# Patient Record
Sex: Female | Born: 1935
Health system: Southern US, Community
[De-identification: ages and names within clinical notes are randomized; demographics above are authoritative.]

## PROBLEM LIST (undated history)

## (undated) DIAGNOSIS — E876 Hypokalemia: Secondary | ICD-10-CM

## (undated) DIAGNOSIS — M810 Age-related osteoporosis without current pathological fracture: Secondary | ICD-10-CM

## (undated) DIAGNOSIS — J45909 Unspecified asthma, uncomplicated: Secondary | ICD-10-CM

## (undated) DIAGNOSIS — M159 Polyosteoarthritis, unspecified: Secondary | ICD-10-CM

## (undated) DIAGNOSIS — I10 Essential (primary) hypertension: Secondary | ICD-10-CM

## (undated) DIAGNOSIS — S82892A Other fracture of left lower leg, initial encounter for closed fracture: Secondary | ICD-10-CM

## (undated) DIAGNOSIS — U071 COVID-19: Secondary | ICD-10-CM

## (undated) DIAGNOSIS — K759 Inflammatory liver disease, unspecified: Secondary | ICD-10-CM

## (undated) DIAGNOSIS — H409 Unspecified glaucoma: Secondary | ICD-10-CM

## (undated) HISTORY — DX: Other fracture of left lower leg, initial encounter for closed fracture: S82.892A

## (undated) HISTORY — DX: Age-related osteoporosis without current pathological fracture: M81.0

## (undated) HISTORY — DX: COVID-19: U07.1

## (undated) HISTORY — PX: FRACTURE SURGERY: SHX138

## (undated) HISTORY — DX: Unspecified asthma, uncomplicated: J45.909

## (undated) HISTORY — DX: Polyosteoarthritis, unspecified: M15.9

## (undated) HISTORY — DX: Unspecified glaucoma: H40.9

## (undated) HISTORY — DX: Hypokalemia: E87.6

## (undated) HISTORY — DX: Inflammatory liver disease, unspecified: K75.9

## (undated) HISTORY — DX: Essential (primary) hypertension: I10

## (undated) HISTORY — PX: REFRACTIVE SURGERY: SHX103

---

## 1998-03-05 ENCOUNTER — Other Ambulatory Visit: Admission: RE | Admit: 1998-03-05 | Discharge: 1998-03-05 | Payer: Self-pay | Admitting: Family Medicine

## 1999-04-04 ENCOUNTER — Other Ambulatory Visit: Admission: RE | Admit: 1999-04-04 | Discharge: 1999-04-04 | Payer: Self-pay | Admitting: Family Medicine

## 2001-09-25 ENCOUNTER — Other Ambulatory Visit: Admission: RE | Admit: 2001-09-25 | Discharge: 2001-09-25 | Payer: Self-pay | Admitting: Family Medicine

## 2003-10-20 ENCOUNTER — Ambulatory Visit (HOSPITAL_COMMUNITY): Admission: RE | Admit: 2003-10-20 | Discharge: 2003-10-20 | Payer: Self-pay | Admitting: Ophthalmology

## 2006-08-20 ENCOUNTER — Other Ambulatory Visit: Admission: RE | Admit: 2006-08-20 | Discharge: 2006-08-20 | Payer: Self-pay | Admitting: Family Medicine

## 2008-10-26 ENCOUNTER — Ambulatory Visit (HOSPITAL_COMMUNITY): Admission: RE | Admit: 2008-10-26 | Discharge: 2008-10-26 | Payer: Self-pay | Admitting: Ophthalmology

## 2009-08-30 ENCOUNTER — Ambulatory Visit (HOSPITAL_COMMUNITY): Admission: RE | Admit: 2009-08-30 | Discharge: 2009-08-30 | Payer: Self-pay | Admitting: Ophthalmology

## 2009-10-24 LAB — HM MAMMOGRAPHY: HM Mammogram: NORMAL

## 2010-08-06 ENCOUNTER — Emergency Department (HOSPITAL_BASED_OUTPATIENT_CLINIC_OR_DEPARTMENT_OTHER)
Admission: EM | Admit: 2010-08-06 | Discharge: 2010-08-06 | Disposition: A | Discharge status: Home / Self Care | Payer: Medicare Other | Attending: Emergency Medicine | Admitting: Emergency Medicine

## 2010-08-06 DIAGNOSIS — Z79899 Other long term (current) drug therapy: Secondary | ICD-10-CM | POA: Insufficient documentation

## 2010-08-06 DIAGNOSIS — M109 Gout, unspecified: Secondary | ICD-10-CM | POA: Insufficient documentation

## 2010-08-06 DIAGNOSIS — I10 Essential (primary) hypertension: Secondary | ICD-10-CM | POA: Insufficient documentation

## 2010-08-06 DIAGNOSIS — Z8739 Personal history of other diseases of the musculoskeletal system and connective tissue: Secondary | ICD-10-CM | POA: Insufficient documentation

## 2010-08-06 DIAGNOSIS — M810 Age-related osteoporosis without current pathological fracture: Secondary | ICD-10-CM | POA: Insufficient documentation

## 2010-08-06 DIAGNOSIS — M79609 Pain in unspecified limb: Secondary | ICD-10-CM | POA: Insufficient documentation

## 2010-08-06 LAB — URIC ACID: Uric Acid, Serum: 5.7 mg/dL (ref 2.4–7.0)

## 2010-08-06 LAB — BASIC METABOLIC PANEL
Calcium: 8.9 mg/dL (ref 8.4–10.5)
Chloride: 109 mEq/L (ref 96–112)
Creatinine, Ser: 0.6 mg/dL (ref 0.4–1.2)
GFR calc Af Amer: >60 mL/min (ref >60)
GFR calc non Af Amer: >60 mL/min (ref >60)

## 2010-10-12 ENCOUNTER — Encounter: Payer: Self-pay | Admitting: Nurse Practitioner

## 2010-10-12 DIAGNOSIS — K759 Inflammatory liver disease, unspecified: Secondary | ICD-10-CM

## 2010-10-12 DIAGNOSIS — H409 Unspecified glaucoma: Secondary | ICD-10-CM | POA: Insufficient documentation

## 2010-10-12 DIAGNOSIS — M47816 Spondylosis without myelopathy or radiculopathy, lumbar region: Secondary | ICD-10-CM | POA: Insufficient documentation

## 2010-10-12 DIAGNOSIS — E119 Type 2 diabetes mellitus without complications: Secondary | ICD-10-CM | POA: Insufficient documentation

## 2010-10-12 DIAGNOSIS — I10 Essential (primary) hypertension: Secondary | ICD-10-CM | POA: Insufficient documentation

## 2012-01-04 ENCOUNTER — Encounter (HOSPITAL_COMMUNITY): Payer: Self-pay | Admitting: Pharmacy Technician

## 2012-01-08 ENCOUNTER — Encounter (HOSPITAL_COMMUNITY)
Admission: RE | Disposition: A | Discharge status: Home / Self Care | Payer: Self-pay | Source: Ambulatory Visit | Attending: Ophthalmology

## 2012-01-08 ENCOUNTER — Ambulatory Visit (HOSPITAL_COMMUNITY)
Admission: RE | Admit: 2012-01-08 | Discharge: 2012-01-08 | Disposition: A | Discharge status: Home / Self Care | Payer: Medicare Other | Source: Ambulatory Visit | Attending: Ophthalmology | Admitting: Ophthalmology

## 2012-01-08 ENCOUNTER — Encounter (HOSPITAL_COMMUNITY): Payer: Self-pay | Admitting: *Deleted

## 2012-01-08 DIAGNOSIS — H4089 Other specified glaucoma: Secondary | ICD-10-CM | POA: Insufficient documentation

## 2012-01-08 SURGERY — SLT LASER APPLICATION
Anesthesia: LOCAL | Laterality: Right

## 2012-01-08 MED ORDER — TETRACAINE HCL 0.5 % OP SOLN
OPHTHALMIC | Status: AC
  Administered 2012-01-08: 1 [drp] via OPHTHALMIC
  Filled 2012-01-08: qty 2

## 2012-01-08 MED ORDER — TETRACAINE HCL 0.5 % OP SOLN
1.0000 [drp] | Freq: Once | OPHTHALMIC | Status: AC
  Administered 2012-01-08: 1 [drp] via OPHTHALMIC

## 2012-01-08 MED ORDER — PILOCARPINE HCL 1 % OP SOLN
OPHTHALMIC | Status: AC
  Administered 2012-01-08: 1 [drp] via OPHTHALMIC
  Filled 2012-01-08: qty 15

## 2012-01-08 MED ORDER — APRACLONIDINE HCL 1 % OP SOLN
OPHTHALMIC | Status: AC
  Administered 2012-01-08: 1 [drp] via OPHTHALMIC
  Filled 2012-01-08: qty 0.1

## 2012-01-08 MED ORDER — APRACLONIDINE HCL 1 % OP SOLN
1.0000 [drp] | OPHTHALMIC | Status: AC
  Administered 2012-01-08 (×3): 1 [drp] via OPHTHALMIC

## 2012-01-08 MED ORDER — PILOCARPINE HCL 1 % OP SOLN
1.0000 [drp] | Freq: Once | OPHTHALMIC | Status: AC
  Administered 2012-01-08: 1 [drp] via OPHTHALMIC

## 2012-01-08 NOTE — Brief Op Note (Signed)
Christina Delgado 01/08/2012  Susa Simmonds, MD  Yag Laser Self Test Completedyes. Procedure: Iridotomy/SLT, right eye.  Eye Protection Worn by Staff yes. Laser In Use Sign on Door yes.  Laser: Nd:YAG Spot Size: Fixed Burst Mode: III Power Setting: 0.9 mJ/burst Position treated: trabecular meshwork 360 degrees Number of shots: 113 Total energy delivered: 101 mJ  Patency of the peripheral iridotomy was confirmed visually.  The patient tolerated the procedure without difficulty. No complications were encountered.  Tenometer reading immediately after procedure: 21 mmHg.  The patient was discharged home with the instructions to continue all her current glaucoma medications, if any.   Patient verbalizes understanding of discharge instructions yes.   Notes:goniscopy OD:  grade 4/4 open, no pigment, no synechiae or recessions noted

## 2012-01-08 NOTE — H&P (Signed)
I have reviewed the pre printed H&P, the patient was re-examined, and I have identified no significant interval changes in the patient's medical condition.  There is no change in the plan of care since the history and physical of record. 

## 2012-10-28 ENCOUNTER — Other Ambulatory Visit: Payer: Self-pay | Admitting: *Deleted

## 2012-10-28 MED ORDER — AMLODIPINE BESY-BENAZEPRIL HCL 5-10 MG PO CAPS: 1.0000 | ORAL_CAPSULE | Freq: Every day | ORAL | Status: DC

## 2012-10-28 MED ORDER — POTASSIUM CHLORIDE CRYS ER 20 MEQ PO TBCR: EXTENDED_RELEASE_TABLET | ORAL | Status: DC

## 2012-11-25 ENCOUNTER — Ambulatory Visit (INDEPENDENT_AMBULATORY_CARE_PROVIDER_SITE_OTHER): Payer: 59 | Admitting: Nurse Practitioner

## 2012-11-25 ENCOUNTER — Encounter: Payer: Self-pay | Admitting: Nurse Practitioner

## 2012-11-25 VITALS — BP 138/72 | HR 78 | Temp 97.9°F | Ht 62.0 in | Wt 160.0 lb

## 2012-11-25 DIAGNOSIS — I10 Essential (primary) hypertension: Secondary | ICD-10-CM

## 2012-11-25 DIAGNOSIS — E119 Type 2 diabetes mellitus without complications: Secondary | ICD-10-CM

## 2012-11-25 DIAGNOSIS — E876 Hypokalemia: Secondary | ICD-10-CM

## 2012-11-25 LAB — COMPLETE METABOLIC PANEL WITH GFR
Albumin: 4.5 g/dL (ref 3.5–5.2)
CO2: 28 mEq/L (ref 19–32)
Chloride: 100 mEq/L (ref 96–112)
GFR, Est African American: >89 mL/min
GFR, Est Non African American: 87 mL/min
Glucose, Bld: 123 mg/dL — ABNORMAL HIGH (ref 70–99)
Potassium: 3.8 mEq/L (ref 3.5–5.3)
Sodium: 141 mEq/L (ref 135–145)
Total Protein: 8.6 g/dL — ABNORMAL HIGH (ref 6.0–8.3)

## 2012-11-25 MED ORDER — AMLODIPINE BESY-BENAZEPRIL HCL 5-10 MG PO CAPS: 1.0000 | ORAL_CAPSULE | Freq: Every day | ORAL | Status: DC

## 2012-11-25 MED ORDER — POTASSIUM CHLORIDE CRYS ER 20 MEQ PO TBCR: EXTENDED_RELEASE_TABLET | ORAL | Status: DC

## 2012-11-25 NOTE — Progress Notes (Signed)
Subjective:    Patient ID: Christina Delgado, female    DOB: 02/13/1931, 79 y.o.   MRN: [id number]  Hypertension This is a chronic problem. The current episode started more than 1 year ago. The problem has been resolved since onset. The problem is controlled. Pertinent negatives include no blurred vision, chest pain, headaches, peripheral edema or shortness of breath. There are no associated agents to hypertension. Risk factors for coronary artery disease include diabetes mellitus, dyslipidemia, obesity and post-menopausal state. Past treatments include ACE inhibitors and calcium channel blockers.  Hyperlipidemia This is a chronic problem. The current episode started more than 1 year ago. The problem is controlled. Recent lipid tests were reviewed and are normal. There are no known factors aggravating her hyperlipidemia. Pertinent negatives include no chest pain or shortness of breath. Current antihyperlipidemic treatment includes diet change. The current treatment provides moderate improvement of lipids. There are no compliance problems.  Risk factors for coronary artery disease include diabetes mellitus, hypertension, obesity and post-menopausal.  Diabetes She presents for her follow-up diabetic visit. She has type 2 diabetes mellitus. No MedicAlert identification noted. The initial diagnosis of diabetes was made 10 years ago. Her disease course has been stable. There are no hypoglycemic associated symptoms. Pertinent negatives for hypoglycemia include no headaches. Pertinent negatives for diabetes include no blurred vision, no chest pain, no polydipsia, no polyphagia, no polyuria and no visual change. There are no hypoglycemic complications. Symptoms are stable. There are no diabetic complications. Risk factors for coronary artery disease include dyslipidemia, hypertension, obesity and post-menopausal. Current diabetic treatment includes diet. She is compliant with treatment all of the time. Her weight  is stable. She is following a diabetic and generally healthy diet. When asked about meal planning, she reported none. She has not had a previous visit with a dietician. She rarely participates in exercise. There is no change in her home blood glucose trend. (Patient doesn;t check blood sugars at home) An ACE inhibitor/angiotensin II receptor blocker is being taken. She does not see a podiatrist.Eye exam is current (Has appointment wednesday of this week.).  hypokalemia K fur daily- No C/O lower ext cramping.    Review of Systems  Eyes: Negative for blurred vision.  Respiratory: Negative for shortness of breath.   Cardiovascular: Negative for chest pain.  Endocrine: Negative for polydipsia, polyphagia and polyuria.  Neurological: Negative for headaches.  All other systems reviewed and are negative.       Objective:   Physical Exam  Constitutional: She is oriented to person, place, and time. She appears well-developed and well-nourished.  HENT:  Nose: Nose normal.  Mouth/Throat: Oropharynx is clear and moist.  Eyes: EOM are normal.  Neck: Trachea normal, normal range of motion and full passive range of motion without pain. Neck supple. No JVD present. Carotid bruit is not present. No thyromegaly present.  Cardiovascular: Normal rate, regular rhythm, normal heart sounds and intact distal pulses.  Exam reveals no gallop and no friction rub.   No murmur heard. Pulmonary/Chest: Effort normal and breath sounds normal.  Abdominal: Soft. Bowel sounds are normal. She exhibits no distension and no mass. There is no tenderness.  Musculoskeletal: Normal range of motion.  Lymphadenopathy:    She has no cervical adenopathy.  Neurological: She is alert and oriented to person, place, and time. She has normal reflexes.  Skin: Skin is warm and dry.  Psychiatric: She has a normal mood and affect. Her behavior is normal. Judgment and thought content normal.   BP  138/72  Pulse 78  Temp(Src) 97.9 F  (36.6 C) (Oral)  Ht 5\' 2"  (1.575 m)  Wt 160 lb (72.576 kg)  BMI 29.26 kg/m2 Results for orders placed in visit on 11/25/12  POCT GLYCOSYLATED HEMOGLOBIN (HGB A1C)      Result Value Range   Hemoglobin A1C 5.6            Assessment & Plan:   1. Hypertension   2. Diabetes mellitus   3. Hypokalemia    Orders Placed This Encounter  Procedures  . COMPLETE METABOLIC PANEL WITH GFR  . NMR Lipoprofile with Lipids  . POCT glycosylated hemoglobin (Hb A1C)   Meds ordered this encounter  Medications  . Multiple Vitamin (MULTIVITAMIN) tablet    Sig: Take 1 tablet by mouth daily.  Marland Kitchen amLODipine-benazepril (LOTREL) 5-10 MG per capsule    Sig: Take 1 capsule by mouth daily.    Dispense:  30 capsule    Refill:  5    Needs to be seen before next refill    Order Specific Question:  Supervising Provider    Answer:  Ernestina Penna [[id number]]  . potassium chloride SA (K-DUR,KLOR-CON) 20 MEQ tablet    Sig: 2 po q day    Dispense:  60 tablet    Refill:  5    Needs to be seen before next refill    Order Specific Question:  Supervising Provider    Answer:  Ernestina Penna [[id number]]   Cintinue all meds Labs pending Diet encouraged Follow-up in 3 months Mary-Margaret Daphine Deutscher, FNP

## 2012-11-25 NOTE — Patient Instructions (Signed)

## 2012-11-26 LAB — NMR LIPOPROFILE WITH LIPIDS
HDL Particle Number: 44.7 umol/L (ref >=30.5)
HDL Size: 9.8 nm (ref >=9.2)
LDL Size: 20.2 nm — ABNORMAL LOW (ref >20.5)
Large HDL-P: 12.6 umol/L (ref >=4.8)
Large VLDL-P: 2.1 nmol/L (ref <=2.7)
Triglycerides: 121 mg/dL (ref <150)

## 2012-11-27 ENCOUNTER — Encounter: Payer: Self-pay | Admitting: *Deleted

## 2013-03-10 ENCOUNTER — Ambulatory Visit (INDEPENDENT_AMBULATORY_CARE_PROVIDER_SITE_OTHER): Payer: 59 | Admitting: Nurse Practitioner

## 2013-03-10 ENCOUNTER — Ambulatory Visit (INDEPENDENT_AMBULATORY_CARE_PROVIDER_SITE_OTHER): Payer: 59

## 2013-03-10 ENCOUNTER — Encounter: Payer: Self-pay | Admitting: Nurse Practitioner

## 2013-03-10 VITALS — BP 139/65 | HR 79 | Temp 97.0°F | Ht 62.0 in | Wt 159.0 lb

## 2013-03-10 DIAGNOSIS — Z2911 Encounter for prophylactic immunotherapy for respiratory syncytial virus (RSV): Secondary | ICD-10-CM

## 2013-03-10 DIAGNOSIS — E119 Type 2 diabetes mellitus without complications: Secondary | ICD-10-CM

## 2013-03-10 DIAGNOSIS — I10 Essential (primary) hypertension: Secondary | ICD-10-CM

## 2013-03-10 DIAGNOSIS — E785 Hyperlipidemia, unspecified: Secondary | ICD-10-CM

## 2013-03-10 DIAGNOSIS — R059 Cough, unspecified: Secondary | ICD-10-CM

## 2013-03-10 DIAGNOSIS — R05 Cough: Secondary | ICD-10-CM

## 2013-03-10 DIAGNOSIS — R0602 Shortness of breath: Secondary | ICD-10-CM

## 2013-03-10 NOTE — Addendum Note (Signed)
Addended by: Bernita Buffy on: 03/10/2013 10:57 AM   Modules accepted: Orders

## 2013-03-10 NOTE — Progress Notes (Signed)
Subjective:    Patient ID: Christina Delgado, female    DOB: 03-Jan-1931, 79 y.o.   MRN: [id number]  Hypertension This is a chronic problem. The current episode started more than 1 year ago. The problem has been resolved since onset. The problem is controlled. Associated symptoms include shortness of breath (slight over the last several weeks). Pertinent negatives include no blurred vision, chest pain, headaches or peripheral edema. There are no associated agents to hypertension. Risk factors for coronary artery disease include diabetes mellitus, dyslipidemia, obesity and post-menopausal state. Past treatments include ACE inhibitors and calcium channel blockers.  Hyperlipidemia This is a chronic problem. The current episode started more than 1 year ago. The problem is controlled. Recent lipid tests were reviewed and are normal. There are no known factors aggravating her hyperlipidemia. Associated symptoms include shortness of breath (slight over the last several weeks). Pertinent negatives include no chest pain. Current antihyperlipidemic treatment includes diet change. The current treatment provides moderate improvement of lipids. There are no compliance problems.  Risk factors for coronary artery disease include diabetes mellitus, hypertension, obesity and post-menopausal.  Diabetes She presents for her follow-up diabetic visit. She has type 2 diabetes mellitus. No MedicAlert identification noted. The initial diagnosis of diabetes was made 10 years ago. Her disease course has been stable. There are no hypoglycemic associated symptoms. Pertinent negatives for hypoglycemia include no headaches. Pertinent negatives for diabetes include no blurred vision, no chest pain, no polydipsia, no polyphagia, no polyuria and no visual change. There are no hypoglycemic complications. Symptoms are stable. There are no diabetic complications. Risk factors for coronary artery disease include dyslipidemia, hypertension,  obesity and post-menopausal. Current diabetic treatment includes diet. She is compliant with treatment all of the time. Her weight is stable. She is following a diabetic and generally healthy diet. When asked about meal planning, she reported none. She has not had a previous visit with a dietician. She rarely participates in exercise. There is no change in her home blood glucose trend. (Patient doesn;t check blood sugars at home) An ACE inhibitor/angiotensin II receptor blocker is being taken. She does not see a podiatrist.Eye exam is current (Has appointment wednesday of this week.).  hypokalemia K fur daily- No C/O lower ext cramping.  * slight cough- started 2 weeks ago and no better- OTC meds help some.  Review of Systems  Eyes: Negative for blurred vision.  Respiratory: Positive for shortness of breath (slight over the last several weeks).   Cardiovascular: Negative for chest pain.  Endocrine: Negative for polydipsia, polyphagia and polyuria.  Neurological: Negative for headaches.  All other systems reviewed and are negative.       Objective:   Physical Exam  Constitutional: She is oriented to person, place, and time. She appears well-developed and well-nourished.  HENT:  Nose: Nose normal.  Mouth/Throat: Oropharynx is clear and moist.  Eyes: EOM are normal.  Neck: Trachea normal, normal range of motion and full passive range of motion without pain. Neck supple. No JVD present. Carotid bruit is not present. No thyromegaly present.  Cardiovascular: Normal rate, regular rhythm, normal heart sounds and intact distal pulses.  Exam reveals no gallop and no friction rub.   No murmur heard. Pulmonary/Chest: Effort normal and breath sounds normal.  Abdominal: Soft. Bowel sounds are normal. She exhibits no distension and no mass. There is no tenderness.  Musculoskeletal: Normal range of motion.  Lymphadenopathy:    She has no cervical adenopathy.  Neurological: She is alert and oriented  to person, place, and time. She has normal reflexes.  Skin: Skin is warm and dry.  Psychiatric: She has a normal mood and affect. Her behavior is normal. Judgment and thought content normal.   BP 139/65  Pulse 79  Temp(Src) 97 F (36.1 C) (Oral)  Ht 5\' 2"  (1.575 m)  Wt 159 lb (72.122 kg)  BMI 29.07 kg/m2 Results for orders placed in visit on 03/10/13  POCT GLYCOSYLATED HEMOGLOBIN (HGB A1C)      Result Value Range   Hemoglobin A1C 5.3    POCT UA - MICROALBUMIN      Result Value Range   Microalbumin Ur, POC neg      Chest x ray- no acute findings-Preliminary reading by Paulene Floor, FNP  [facility] ekg-NSR    Assessment & Plan:   1. Diabetes   2. Hyperlipidemia   3. Hypertension   4. Cough   5. SOB (shortness of breath)    Orders Placed This Encounter  Procedures  . DG Chest 2 View    Standing Status: Future     Number of Occurrences:      Standing Expiration Date: 05/10/2014    Order Specific Question:  Reason for Exam (SYMPTOM  OR DIAGNOSIS REQUIRED)    Answer:  cough    Order Specific Question:  Preferred imaging location?    Answer:  Internal  . NMR, lipoprofile  . CMP14+EGFR  . POCT glycosylated hemoglobin (Hb A1C)  . POCT UA - Microalbumin  . EKG 12-Lead     Medication List       This list is accurate as of: 03/10/13 10:11 AM.  Always use your most recent med list.               amLODipine-benazepril 5-10 MG per capsule  Commonly known as:  LOTREL  Take 1 capsule by mouth daily.     CALCIUM 600+D 600-400 MG-UNIT per tablet  Generic drug:  Calcium Carbonate-Vitamin D  Take 1 tablet by mouth daily.     Fish Oil 1200 MG Caps  Take 1,200 mg by mouth 3 (three) times daily.     multivitamin tablet  Take 1 tablet by mouth daily.     OSTEO BI-FLEX TRIPLE STRENGTH Tabs  Take 4,000 each by mouth daily. Patient takes 2000 mg(2 tablets at one time daily)     potassium chloride SA 20 MEQ tablet  Commonly known as:  K-DUR,KLOR-CON  2 po q day         Cintinue all meds Labs pending Diet encouraged Follow-up in 3 months Mary-Margaret Daphine Deutscher, FNP

## 2013-03-11 LAB — CMP14+EGFR
AST: 47 IU/L — ABNORMAL HIGH (ref 0–40)
Albumin/Globulin Ratio: 1.4 (ref 1.1–2.5)
Alkaline Phosphatase: 75 IU/L (ref 39–117)
BUN/Creatinine Ratio: 7 — ABNORMAL LOW (ref 11–26)
CO2: 27 mmol/L (ref 18–29)
Creatinine, Ser: 0.68 mg/dL (ref 0.57–1.00)
GFR calc Af Amer: 96 mL/min/{1.73_m2} (ref >59)
Globulin, Total: 3.1 g/dL (ref 1.5–4.5)
Sodium: 143 mmol/L (ref 134–144)
Total Bilirubin: 0.3 mg/dL (ref 0.0–1.2)

## 2013-03-11 LAB — NMR, LIPOPROFILE
Cholesterol: 140 mg/dL (ref <200)
HDL Cholesterol by NMR: 57 mg/dL (ref >=40)
LDL Particle Number: 1608 nmol/L — ABNORMAL HIGH (ref <1000)
LDL Size: 20.8 nm (ref >20.5)
Triglycerides by NMR: 113 mg/dL (ref <150)

## 2013-03-19 ENCOUNTER — Telehealth: Payer: Self-pay | Admitting: *Deleted

## 2013-03-19 DIAGNOSIS — M5137 Other intervertebral disc degeneration, lumbosacral region: Secondary | ICD-10-CM

## 2013-03-19 NOTE — Telephone Encounter (Signed)
Pt aware.

## 2013-03-19 NOTE — Telephone Encounter (Signed)
Patient wants a prescription for a 4 pronged walker, please put rx up front, she will come to pick it up

## 2013-03-19 NOTE — Telephone Encounter (Signed)
rx ready fro pick up

## 2013-04-02 ENCOUNTER — Ambulatory Visit (INDEPENDENT_AMBULATORY_CARE_PROVIDER_SITE_OTHER): Payer: 59

## 2013-04-02 DIAGNOSIS — Z23 Encounter for immunization: Secondary | ICD-10-CM

## 2013-04-21 ENCOUNTER — Ambulatory Visit: Payer: 59

## 2013-06-11 ENCOUNTER — Encounter: Payer: Self-pay | Admitting: Nurse Practitioner

## 2013-06-11 ENCOUNTER — Ambulatory Visit (INDEPENDENT_AMBULATORY_CARE_PROVIDER_SITE_OTHER): Payer: 59 | Admitting: Nurse Practitioner

## 2013-06-11 VITALS — BP 113/59 | HR 70 | Temp 97.6°F | Ht 62.0 in | Wt 161.0 lb

## 2013-06-11 DIAGNOSIS — I10 Essential (primary) hypertension: Secondary | ICD-10-CM

## 2013-06-11 DIAGNOSIS — M5137 Other intervertebral disc degeneration, lumbosacral region: Secondary | ICD-10-CM

## 2013-06-11 DIAGNOSIS — M47816 Spondylosis without myelopathy or radiculopathy, lumbar region: Secondary | ICD-10-CM

## 2013-06-11 DIAGNOSIS — E119 Type 2 diabetes mellitus without complications: Secondary | ICD-10-CM

## 2013-06-11 DIAGNOSIS — K759 Inflammatory liver disease, unspecified: Secondary | ICD-10-CM

## 2013-06-11 LAB — POCT GLYCOSYLATED HEMOGLOBIN (HGB A1C): Hemoglobin A1C: 6.7

## 2013-06-11 NOTE — Progress Notes (Signed)
Subjective:    Patient ID: Christina Delgado, female    DOB: April 29, 1931, 79 y.o.   MRN: [id number]  Patient here today for follow up- no changes since last visit- no complaints today  Hypertension This is a chronic problem. The current episode started more than 1 year ago. The problem has been resolved since onset. The problem is controlled. Pertinent negatives include no blurred vision, chest pain, headaches or peripheral edema. There are no associated agents to hypertension. Risk factors for coronary artery disease include diabetes mellitus, dyslipidemia, obesity and post-menopausal state. Past treatments include ACE inhibitors and calcium channel blockers.  Hyperlipidemia This is a chronic problem. The current episode started more than 1 year ago. The problem is controlled. Recent lipid tests were reviewed and are normal. There are no known factors aggravating her hyperlipidemia. Pertinent negatives include no chest pain. Current antihyperlipidemic treatment includes diet change. The current treatment provides moderate improvement of lipids. There are no compliance problems.  Risk factors for coronary artery disease include diabetes mellitus, hypertension, obesity and post-menopausal.  Diabetes She presents for her follow-up diabetic visit. She has type 2 diabetes mellitus. No MedicAlert identification noted. The initial diagnosis of diabetes was made 10 years ago. Her disease course has been stable. There are no hypoglycemic associated symptoms. Pertinent negatives for hypoglycemia include no headaches. Pertinent negatives for diabetes include no blurred vision, no chest pain, no polydipsia, no polyphagia, no polyuria and no visual change. There are no hypoglycemic complications. Symptoms are stable. There are no diabetic complications. Risk factors for coronary artery disease include dyslipidemia, hypertension, obesity and post-menopausal. Current diabetic treatment includes diet. She is compliant  with treatment all of the time. Her weight is stable. She is following a diabetic and generally healthy diet. When asked about meal planning, she reported none. She has not had a previous visit with a dietician. She rarely participates in exercise. There is no change in her home blood glucose trend. (Patient doesn;t check blood sugars at home) An ACE inhibitor/angiotensin II receptor blocker is being taken. She does not see a podiatrist.Eye exam is current (Has appointment wednesday of this week.).  hypokalemia K fur daily- No C/O lower ext cramping.    Review of Systems  Constitutional: Negative.   Eyes: Negative for blurred vision.  Respiratory: Negative.   Cardiovascular: Negative for chest pain.  Endocrine: Negative for polydipsia, polyphagia and polyuria.  Neurological: Negative for headaches.  Psychiatric/Behavioral: Negative.   All other systems reviewed and are negative.       Objective:   Physical Exam  Constitutional: She is oriented to person, place, and time. She appears well-developed and well-nourished.  HENT:  Nose: Nose normal.  Mouth/Throat: Oropharynx is clear and moist.  Eyes: EOM are normal.  Neck: Trachea normal, normal range of motion and full passive range of motion without pain. Neck supple. No JVD present. Carotid bruit is not present. No thyromegaly present.  Cardiovascular: Normal rate, regular rhythm, normal heart sounds and intact distal pulses.  Exam reveals no gallop and no friction rub.   No murmur heard. Pulmonary/Chest: Effort normal and breath sounds normal.  Abdominal: Soft. Bowel sounds are normal. She exhibits no distension and no mass. There is no tenderness.  Musculoskeletal: Normal range of motion.  Lymphadenopathy:    She has no cervical adenopathy.  Neurological: She is alert and oriented to person, place, and time. She has normal reflexes.  Skin: Skin is warm and dry.  Psychiatric: She has a normal mood and affect.  Her behavior is  normal. Judgment and thought content normal.   BP 113/59  Pulse 70  Temp(Src) 97.6 F (36.4 C) (Oral)  Ht 5\' 2"  (1.575 m)  Wt 161 lb (73.029 kg)  BMI 29.44 kg/m2 Results for orders placed in visit on 06/11/13  POCT GLYCOSYLATED HEMOGLOBIN (HGB A1C)      Result Value Range   Hemoglobin A1C 6.7%         Assessment & Plan:   1. Hypertension   2. Diabetes mellitus   3. DJD (degenerative joint disease), lumbar   4. Hepatitis    Orders Placed This Encounter  Procedures  . CMP14+EGFR  . NMR, lipoprofile  . POCT glycosylated hemoglobin (Hb A1C)   Meds ordered this encounter  Medications  . latanoprost (XALATAN) 0.005 % ophthalmic solution    Sig: Place 1 drop into the left eye at bedtime.    Continue all meds Labs pending Diet and exercise encouraged Health maintenance reviewed Follow up in 3 month  Mary-Margaret Daphine Deutscher, FNP

## 2013-06-11 NOTE — Patient Instructions (Signed)
Diabetes and Foot Care Diabetes may cause you to have problems because of poor blood supply (circulation) to your feet and legs. This may cause the skin on your feet to become thinner, break easier, and heal more slowly. Your skin may become dry, and the skin may peel and crack. You may also have nerve damage in your legs and feet causing decreased feeling in them. You may not notice minor injuries to your feet that could lead to infections or more serious problems. Taking care of your feet is one of the most important things you can do for yourself.  HOME CARE INSTRUCTIONS  Wear shoes at all times, even in the house. Do not go barefoot. Bare feet are easily injured.  Check your feet daily for blisters, cuts, and redness. If you cannot see the bottom of your feet, use a mirror or ask someone for help.  Wash your feet with warm water (do not use hot water) and mild soap. Then pat your feet and the areas between your toes until they are completely dry. Do not soak your feet as this can dry your skin.  Apply a moisturizing lotion or petroleum jelly (that does not contain alcohol and is unscented) to the skin on your feet and to dry, brittle toenails. Do not apply lotion between your toes.  Trim your toenails straight across. Do not dig under them or around the cuticle. File the edges of your nails with an emery board or nail file.  Do not cut corns or calluses or try to remove them with medicine.  Wear clean socks or stockings every day. Make sure they are not too tight. Do not wear knee-high stockings since they may decrease blood flow to your legs.  Wear shoes that fit properly and have enough cushioning. To break in new shoes, wear them for just a few hours a day. This prevents you from injuring your feet. Always look in your shoes before you put them on to be sure there are no objects inside.  Do not cross your legs. This may decrease the blood flow to your feet.  If you find a minor scrape,  cut, or break in the skin on your feet, keep it and the skin around it clean and dry. These areas may be cleansed with mild soap and water. Do not cleanse the area with peroxide, alcohol, or iodine.  When you remove an adhesive bandage, be sure not to damage the skin around it.  If you have a wound, look at it several times a day to make sure it is healing.  Do not use heating pads or hot water bottles. They may burn your skin. If you have lost feeling in your feet or legs, you may not know it is happening until it is too late.  Make sure your health care provider performs a complete foot exam at least annually or more often if you have foot problems. Report any cuts, sores, or bruises to your health care provider immediately. SEEK MEDICAL CARE IF:   You have an injury that is not healing.  You have cuts or breaks in the skin.  You have an ingrown nail.  You notice redness on your legs or feet.  You feel burning or tingling in your legs or feet.  You have pain or cramps in your legs and feet.  Your legs or feet are numb.  Your feet always feel cold. SEEK IMMEDIATE MEDICAL CARE IF:   There is increasing redness,   swelling, or pain in or around a wound.  There is a red line that goes up your leg.  Pus is coming from a wound.  You develop a fever or as directed by your health care provider.  You notice a bad smell coming from an ulcer or wound. Document Released: 06/09/2000 Document Revised: 02/12/2013 Document Reviewed: 11/19/2012 [employer] Patient Information 2014 [employer].  

## 2013-06-13 LAB — CMP14+EGFR
ALT: 32 IU/L (ref 0–32)
AST: 42 IU/L — ABNORMAL HIGH (ref 0–40)
Albumin/Globulin Ratio: 1.2 (ref 1.1–2.5)
Alkaline Phosphatase: 83 IU/L (ref 39–117)
BUN/Creatinine Ratio: 6 — ABNORMAL LOW (ref 11–26)
Creatinine, Ser: 0.66 mg/dL (ref 0.57–1.00)
GFR calc Af Amer: 96 mL/min/{1.73_m2} (ref >59)
GFR calc non Af Amer: 84 mL/min/{1.73_m2} (ref >59)
Globulin, Total: 3.4 g/dL (ref 1.5–4.5)
Potassium: 4.6 mmol/L (ref 3.5–5.2)
Sodium: 143 mmol/L (ref 134–144)
Total Bilirubin: 0.5 mg/dL (ref 0.0–1.2)

## 2013-06-13 LAB — NMR, LIPOPROFILE
HDL Particle Number: 41.3 umol/L (ref >=30.5)
LDL Size: 21.1 nm (ref >20.5)
Small LDL Particle Number: 178 nmol/L (ref <=527)

## 2013-06-16 ENCOUNTER — Telehealth: Payer: Self-pay | Admitting: Nurse Practitioner

## 2013-06-16 MED ORDER — SIMVASTATIN 40 MG PO TABS: 40.0000 mg | ORAL_TABLET | Freq: Every day | ORAL | Status: DC

## 2013-06-16 NOTE — Telephone Encounter (Signed)
zocor rx sent to pharmacy

## 2013-06-16 NOTE — Telephone Encounter (Signed)
Wants you to call in rx for cholesterol she can tell by my chart that it is going up

## 2013-06-16 NOTE — Telephone Encounter (Signed)
Patient aware.

## 2013-07-08 ENCOUNTER — Telehealth: Payer: Self-pay | Admitting: Nurse Practitioner

## 2013-07-08 DIAGNOSIS — E876 Hypokalemia: Secondary | ICD-10-CM

## 2013-07-08 MED ORDER — POTASSIUM CHLORIDE CRYS ER 20 MEQ PO TBCR: EXTENDED_RELEASE_TABLET | ORAL | Status: DC

## 2013-07-08 NOTE — Telephone Encounter (Signed)
Patient aware.

## 2013-09-10 ENCOUNTER — Encounter: Payer: Self-pay | Admitting: Nurse Practitioner

## 2013-09-10 ENCOUNTER — Ambulatory Visit (INDEPENDENT_AMBULATORY_CARE_PROVIDER_SITE_OTHER): Payer: 59 | Admitting: Nurse Practitioner

## 2013-09-10 ENCOUNTER — Telehealth: Payer: Self-pay | Admitting: Nurse Practitioner

## 2013-09-10 ENCOUNTER — Encounter (INDEPENDENT_AMBULATORY_CARE_PROVIDER_SITE_OTHER): Payer: Self-pay

## 2013-09-10 VITALS — BP 138/68 | HR 83 | Temp 97.5°F | Ht 62.0 in | Wt 162.8 lb

## 2013-09-10 DIAGNOSIS — E119 Type 2 diabetes mellitus without complications: Secondary | ICD-10-CM

## 2013-09-10 DIAGNOSIS — I10 Essential (primary) hypertension: Secondary | ICD-10-CM

## 2013-09-10 DIAGNOSIS — E785 Hyperlipidemia, unspecified: Secondary | ICD-10-CM

## 2013-09-10 LAB — POCT GLYCOSYLATED HEMOGLOBIN (HGB A1C): Hemoglobin A1C: 5.5

## 2013-09-10 NOTE — Patient Instructions (Signed)
Fat and Cholesterol Control Diet  Fat and cholesterol levels in your blood and organs are influenced by your diet. High levels of fat and cholesterol may lead to diseases of the heart, small and large blood vessels, gallbladder, liver, and pancreas.  CONTROLLING FAT AND CHOLESTEROL WITH DIET  Although exercise and lifestyle factors are important, your diet is key. That is because certain foods are known to raise cholesterol and others to lower it. The goal is to balance foods for their effect on cholesterol and more importantly, to replace saturated and trans fat with other types of fat, such as monounsaturated fat, polyunsaturated fat, and omega-3 fatty acids.  On average, a person should consume no more than 15 to 17 g of saturated fat daily. Saturated and trans fats are considered "bad" fats, and they will raise LDL cholesterol. Saturated fats are primarily found in animal products such as meats, butter, and cream. However, that does not mean you need to give up all your favorite foods. Today, there are good tasting, low-fat, low-cholesterol substitutes for most of the things you like to eat. Choose low-fat or nonfat alternatives. Choose round or loin cuts of red meat. These types of cuts are lowest in fat and cholesterol. Chicken (without the skin), fish, veal, and ground turkey breast are great choices. Eliminate fatty meats, such as hot dogs and salami. Even shellfish have little or no saturated fat. Have a 3 oz (85 g) portion when you eat lean meat, poultry, or fish.  Trans fats are also called "partially hydrogenated oils." They are oils that have been scientifically manipulated so that they are solid at room temperature resulting in a longer shelf life and improved taste and texture of foods in which they are added. Trans fats are found in stick margarine, some tub margarines, cookies, crackers, and baked goods.   When baking and cooking, oils are a great substitute for butter. The monounsaturated oils are  especially beneficial since it is believed they lower LDL and raise HDL. The oils you should avoid entirely are saturated tropical oils, such as coconut and palm.   Remember to eat a lot from food groups that are naturally free of saturated and trans fat, including fish, fruit, vegetables, beans, grains (barley, rice, couscous, bulgur wheat), and pasta (without cream sauces).   IDENTIFYING FOODS THAT LOWER FAT AND CHOLESTEROL   Soluble fiber may lower your cholesterol. This type of fiber is found in fruits such as apples, vegetables such as broccoli, potatoes, and carrots, legumes such as beans, peas, and lentils, and grains such as barley. Foods fortified with plant sterols (phytosterol) may also lower cholesterol. You should eat at least 2 g per day of these foods for a cholesterol lowering effect.   Read package labels to identify low-saturated fats, trans fat free, and low-fat foods at the supermarket. Select cheeses that have only 2 to 3 g saturated fat per ounce. Use a heart-healthy tub margarine that is free of trans fats or partially hydrogenated oil. When buying baked goods (cookies, crackers), avoid partially hydrogenated oils. Breads and muffins should be made from whole grains (whole-wheat or whole oat flour, instead of "flour" or "enriched flour"). Buy non-creamy canned soups with reduced salt and no added fats.   FOOD PREPARATION TECHNIQUES   Never deep-fry. If you must fry, either stir-fry, which uses very little fat, or use non-stick cooking sprays. When possible, broil, bake, or roast meats, and steam vegetables. Instead of putting butter or margarine on vegetables, use lemon   and herbs, applesauce, and cinnamon (for squash and sweet potatoes). Use nonfat yogurt, salsa, and low-fat dressings for salads.   LOW-SATURATED FAT / LOW-FAT FOOD SUBSTITUTES  Meats / Saturated Fat (g)  · Avoid: Steak, marbled (3 oz/85 g) / 11 g  · Choose: Steak, lean (3 oz/85 g) / 4 g  · Avoid: Hamburger (3 oz/85 g) / 7  g  · Choose: Hamburger, lean (3 oz/85 g) / 5 g  · Avoid: Ham (3 oz/85 g) / 6 g  · Choose: Ham, lean cut (3 oz/85 g) / 2.4 g  · Avoid: Chicken, with skin, dark meat (3 oz/85 g) / 4 g  · Choose: Chicken, skin removed, dark meat (3 oz/85 g) / 2 g  · Avoid: Chicken, with skin, light meat (3 oz/85 g) / 2.5 g  · Choose: Chicken, skin removed, light meat (3 oz/85 g) / 1 g  Dairy / Saturated Fat (g)  · Avoid: Whole milk (1 cup) / 5 g  · Choose: Low-fat milk, 2% (1 cup) / 3 g  · Choose: Low-fat milk, 1% (1 cup) / 1.5 g  · Choose: Skim milk (1 cup) / 0.3 g  · Avoid: Hard cheese (1 oz/28 g) / 6 g  · Choose: Skim milk cheese (1 oz/28 g) / 2 to 3 g  · Avoid: Cottage cheese, 4% fat (1 cup) / 6.5 g  · Choose: Low-fat cottage cheese, 1% fat (1 cup) / 1.5 g  · Avoid: Ice cream (1 cup) / 9 g  · Choose: Sherbet (1 cup) / 2.5 g  · Choose: Nonfat frozen yogurt (1 cup) / 0.3 g  · Choose: Frozen fruit bar / trace  · Avoid: Whipped cream (1 tbs) / 3.5 g  · Choose: Nondairy whipped topping (1 tbs) / 1 g  Condiments / Saturated Fat (g)  · Avoid: Mayonnaise (1 tbs) / 2 g  · Choose: Low-fat mayonnaise (1 tbs) / 1 g  · Avoid: Butter (1 tbs) / 7 g  · Choose: Extra light margarine (1 tbs) / 1 g  · Avoid: Coconut oil (1 tbs) / 11.8 g  · Choose: Olive oil (1 tbs) / 1.8 g  · Choose: Corn oil (1 tbs) / 1.7 g  · Choose: Safflower oil (1 tbs) / 1.2 g  · Choose: Sunflower oil (1 tbs) / 1.4 g  · Choose: Soybean oil (1 tbs) / 2.4 g  · Choose: Canola oil (1 tbs) / 1 g  Document Released: 06/12/2005 Document Revised: 10/07/2012 Document Reviewed: 12/01/2010  [employer]® Patient Information ©2014 [employer].

## 2013-09-10 NOTE — Telephone Encounter (Signed)
Got it- But only get 1 pneumonia vaccine after the age of 78

## 2013-09-10 NOTE — Telephone Encounter (Signed)
Patient aware.

## 2013-09-10 NOTE — Progress Notes (Signed)
Subjective:    Patient ID: Christina Delgado, female    DOB: [birth year], 80 y.o.   MRN: [id number]  Patient here today for follow up- no changes since last visit or new complaints today.  Hypertension This is a chronic problem. The current episode started more than 1 year ago. The problem has been resolved since onset. The problem is controlled. Pertinent negatives include no blurred vision, chest pain, headaches, palpitations, peripheral edema or shortness of breath. There are no associated agents to hypertension. Risk factors for coronary artery disease include diabetes mellitus, dyslipidemia, obesity and post-menopausal state. Past treatments include ACE inhibitors and calcium channel blockers. Improvement on treatment: Monitors BP daily, usually runs 117/60. Compliance problems include exercise.   Hyperlipidemia This is a chronic problem. The current episode started more than 1 year ago. The problem is controlled. Recent lipid tests were reviewed and are normal. There are no known factors aggravating her hyperlipidemia. Pertinent negatives include no chest pain or shortness of breath. Current antihyperlipidemic treatment includes diet change (Has never taken Simvastatin, refuses to take it). The current treatment provides moderate improvement of lipids. Compliance problems include medication side effects.  Risk factors for coronary artery disease include diabetes mellitus, hypertension, obesity and post-menopausal.  Diabetes She presents for her follow-up diabetic visit. She has type 2 diabetes mellitus. No MedicAlert identification noted. The initial diagnosis of diabetes was made 11 years ago. Her disease course has been stable. There are no hypoglycemic associated symptoms. Pertinent negatives for hypoglycemia include no dizziness or headaches. Pertinent negatives for diabetes include no blurred vision, no chest pain, no polydipsia, no polyphagia, no polyuria and no visual change. There are no  hypoglycemic complications. Symptoms are stable. There are no diabetic complications. Risk factors for coronary artery disease include dyslipidemia, hypertension, obesity and post-menopausal. Current diabetic treatment includes diet. She is compliant with treatment all of the time. Her weight is stable. She is following a diabetic and generally healthy diet. When asked about meal planning, she reported none. She has not had a previous visit with a dietician. She rarely participates in exercise. There is no change in her home blood glucose trend. (Patient doesn;t check blood sugars at home) An ACE inhibitor/angiotensin II receptor blocker is being taken. She does not see a podiatrist.Eye exam is current (Has appointment wednesday of this week.).  hypokalemia K-Dur daily- No C/O lower ext cramping    Review of Systems  Constitutional: Negative.   Eyes: Negative for blurred vision.  Respiratory: Negative.  Negative for shortness of breath.   Cardiovascular: Negative for chest pain and palpitations.  Gastrointestinal: Negative for diarrhea and constipation.  Endocrine: Negative for polydipsia, polyphagia and polyuria.  Neurological: Negative for dizziness and headaches.  Psychiatric/Behavioral: Negative.  Negative for sleep disturbance.  All other systems reviewed and are negative.       Objective:   Physical Exam  Constitutional: She is oriented to person, place, and time. She appears well-developed and well-nourished.  HENT:  Nose: Nose normal.  Mouth/Throat: Oropharynx is clear and moist.  Eyes: EOM are normal.  Neck: Trachea normal, normal range of motion and full passive range of motion without pain. Neck supple. No JVD present. Carotid bruit is not present. No thyromegaly present.  Cardiovascular: Normal rate, regular rhythm, normal heart sounds and intact distal pulses.  Exam reveals no gallop and no friction rub.   No murmur heard. Pulmonary/Chest: Effort normal and breath sounds  normal.  Abdominal: Soft. Bowel sounds are normal. She exhibits no distension  and no mass. There is no tenderness.  Musculoskeletal: Normal range of motion.  Lymphadenopathy:    She has no cervical adenopathy.  Neurological: She is alert and oriented to person, place, and time. She has normal reflexes.  Skin: Skin is warm and dry.  Psychiatric: She has a normal mood and affect. Her behavior is normal. Judgment and thought content normal.   BP 138/68  Pulse 83  Temp(Src) 97.5 F (36.4 C) (Oral)  Ht 5' 2" (1.575 m)  Wt 162 lb 12.8 oz (73.846 kg)  BMI 29.77 kg/m2  Results for orders placed in visit on 09/10/13  POCT GLYCOSYLATED HEMOGLOBIN (HGB A1C)      Result Value Ref Range   Hemoglobin A1C 5.5         Assessment & Plan:   1. Hypertension   2. Diabetes mellitus    Orders Placed This Encounter  Procedures  . CMP14+EGFR  . NMR, lipoprofile  . POCT glycosylated hemoglobin (Hb A1C)    Labs pending Health maintenance reviewed Diet and exercise encouraged Discussed importance of taking Simvastatin - Patient refuses due to possible side effects Continue all meds Follow up  In 3 months    Mary-Margaret , FNP      

## 2013-09-12 ENCOUNTER — Telehealth: Payer: Self-pay | Admitting: Family Medicine

## 2013-09-12 LAB — NMR, LIPOPROFILE
Cholesterol: 136 mg/dL (ref <200)
HDL CHOLESTEROL BY NMR: 55 mg/dL (ref >=40)
HDL Particle Number: 37.1 umol/L (ref >=30.5)
LDL PARTICLE NUMBER: 1168 nmol/L — AB (ref <1000)
LDL Size: 20.7 nm (ref >20.5)
LDLC SERPL CALC-MCNC: 64 mg/dL (ref <100)
LP-IR Score: <25 (ref <=45)
SMALL LDL PARTICLE NUMBER: 674 nmol/L — AB (ref <=527)
Triglycerides by NMR: 86 mg/dL (ref <150)

## 2013-09-12 LAB — CMP14+EGFR
A/G RATIO: 1.3 (ref 1.1–2.5)
ALT: 19 IU/L (ref 0–32)
AST: 28 IU/L (ref 0–40)
Albumin: 3.9 g/dL (ref 3.5–4.7)
Alkaline Phosphatase: 62 IU/L (ref 39–117)
BUN/Creatinine Ratio: 5 — ABNORMAL LOW (ref 11–26)
BUN: 3 mg/dL — ABNORMAL LOW (ref 8–27)
CALCIUM: 9.1 mg/dL (ref 8.7–10.3)
CHLORIDE: 100 mmol/L (ref 97–108)
CO2: 25 mmol/L (ref 18–29)
Creatinine, Ser: 0.57 mg/dL (ref 0.57–1.00)
GFR calc Af Amer: 101 mL/min/{1.73_m2} (ref >59)
GFR, EST NON AFRICAN AMERICAN: 88 mL/min/{1.73_m2} (ref >59)
GLUCOSE: 109 mg/dL — AB (ref 65–99)
Globulin, Total: 3 g/dL (ref 1.5–4.5)
POTASSIUM: 3.6 mmol/L (ref 3.5–5.2)
SODIUM: 143 mmol/L (ref 134–144)
TOTAL PROTEIN: 6.9 g/dL (ref 6.0–8.5)
Total Bilirubin: 0.5 mg/dL (ref 0.0–1.2)

## 2013-09-12 NOTE — Telephone Encounter (Signed)
Message copied by Azalee CourseFULP, Nochum Fenter on Fri Sep 12, 2013  2:26 PM ------      Message from: Bennie PieriniMARTIN, MARY-MARGARET      Created: Fri Sep 12, 2013  1:52 PM       hgba1c discussed at appointment      Kidney and liver function stable      Cholesterol looks great      Continue current meds- low fat diet and exercise and recheck in 3 months       ------

## 2013-10-16 ENCOUNTER — Telehealth: Payer: Self-pay | Admitting: Nurse Practitioner

## 2013-10-16 DIAGNOSIS — R2689 Other abnormalities of gait and mobility: Secondary | ICD-10-CM

## 2013-10-16 NOTE — Telephone Encounter (Signed)
Daughter made aware that Medicare will not cover home health and in home PT without a face-to-face visit. She seemed to think there is a way around this. I asked her to call a home health agency to verify this if she doubted this was true.  She would like to speak directly with Gennette PacMary Margaret about her mother. I will pass this message on to her.

## 2013-10-16 NOTE — Telephone Encounter (Signed)
Patient does not currently have home health services. She will need a face-to-face visit to order home health and PT. Insurance won't cover otherwise.  Discussed with patient and left daughter a message to call back. Patient said she would let us know if she wants to proceed with this.

## 2013-10-16 NOTE — Telephone Encounter (Signed)
Put in order for in home PT- call if don't hear anything by Wednesday of next week

## 2013-10-17 ENCOUNTER — Other Ambulatory Visit: Payer: Self-pay | Admitting: Nurse Practitioner

## 2013-10-17 DIAGNOSIS — Z8781 Personal history of (healed) traumatic fracture: Secondary | ICD-10-CM

## 2013-10-17 NOTE — Telephone Encounter (Signed)
Called and left message that we do need a face to face visit in order to get in home PT- Told to return call if has any questions.

## 2013-11-13 ENCOUNTER — Telehealth: Payer: Self-pay | Admitting: Nurse Practitioner

## 2013-11-13 NOTE — Telephone Encounter (Signed)
Patient is going to bring form with her and it is asking when she is due for colonoscopy, mammogram and labs

## 2013-11-28 ENCOUNTER — Other Ambulatory Visit: Payer: Self-pay | Admitting: Nurse Practitioner

## 2013-12-01 LAB — HM DIABETES EYE EXAM

## 2013-12-17 ENCOUNTER — Encounter: Payer: Self-pay | Admitting: Nurse Practitioner

## 2013-12-17 ENCOUNTER — Ambulatory Visit (INDEPENDENT_AMBULATORY_CARE_PROVIDER_SITE_OTHER): Payer: 59 | Admitting: Nurse Practitioner

## 2013-12-17 VITALS — BP 124/68 | HR 78 | Temp 97.6°F | Ht 62.0 in | Wt 155.4 lb

## 2013-12-17 DIAGNOSIS — I499 Cardiac arrhythmia, unspecified: Secondary | ICD-10-CM

## 2013-12-17 DIAGNOSIS — Z13820 Encounter for screening for osteoporosis: Secondary | ICD-10-CM

## 2013-12-17 DIAGNOSIS — Z6824 Body mass index (BMI) 24.0-24.9, adult: Secondary | ICD-10-CM | POA: Insufficient documentation

## 2013-12-17 DIAGNOSIS — E876 Hypokalemia: Secondary | ICD-10-CM

## 2013-12-17 DIAGNOSIS — E119 Type 2 diabetes mellitus without complications: Secondary | ICD-10-CM

## 2013-12-17 DIAGNOSIS — H409 Unspecified glaucoma: Secondary | ICD-10-CM

## 2013-12-17 DIAGNOSIS — Z713 Dietary counseling and surveillance: Secondary | ICD-10-CM

## 2013-12-17 DIAGNOSIS — M47817 Spondylosis without myelopathy or radiculopathy, lumbosacral region: Secondary | ICD-10-CM

## 2013-12-17 DIAGNOSIS — M47896 Other spondylosis, lumbar region: Secondary | ICD-10-CM

## 2013-12-17 DIAGNOSIS — I10 Essential (primary) hypertension: Secondary | ICD-10-CM

## 2013-12-17 DIAGNOSIS — Z6828 Body mass index (BMI) 28.0-28.9, adult: Secondary | ICD-10-CM

## 2013-12-17 DIAGNOSIS — E785 Hyperlipidemia, unspecified: Secondary | ICD-10-CM

## 2013-12-17 LAB — POCT GLYCOSYLATED HEMOGLOBIN (HGB A1C): HEMOGLOBIN A1C: 5.3

## 2013-12-17 MED ORDER — POTASSIUM CHLORIDE CRYS ER 20 MEQ PO TBCR: EXTENDED_RELEASE_TABLET | ORAL | Status: DC

## 2013-12-17 MED ORDER — AMLODIPINE BESY-BENAZEPRIL HCL 5-10 MG PO CAPS: 1.0000 | ORAL_CAPSULE | Freq: Every day | ORAL | Status: DC

## 2013-12-17 MED ORDER — SIMVASTATIN 40 MG PO TABS: 40.0000 mg | ORAL_TABLET | Freq: Every day | ORAL | Status: DC

## 2013-12-17 NOTE — Progress Notes (Signed)
Subjective:    Patient ID: Christina Delgado, female    DOB: 29-Nov-1930, 80 y.o.   MRN: [id number]  Patient here today for follow up- no changes since last visit or new complaints today.She twisted her ankle several weeks ago and was found to have a small fractre and she has been in a cam boot which she did not wear to the office today.  Hypertension This is a chronic problem. The current episode started more than 1 year ago. The problem has been resolved since onset. The problem is controlled. Pertinent negatives include no blurred vision, chest pain, headaches, palpitations, peripheral edema or shortness of breath. There are no associated agents to hypertension. Risk factors for coronary artery disease include diabetes mellitus, dyslipidemia, obesity and post-menopausal state. Past treatments include ACE inhibitors and calcium channel blockers. Improvement on treatment: Monitors BP daily, usually runs 117/60. Compliance problems include exercise.   Hyperlipidemia This is a chronic problem. The current episode started more than 1 year ago. The problem is controlled. Recent lipid tests were reviewed and are normal. There are no known factors aggravating her hyperlipidemia. Pertinent negatives include no chest pain or shortness of breath. Current antihyperlipidemic treatment includes diet change (Has never taken Simvastatin, refuses to take it). The current treatment provides moderate improvement of lipids. Compliance problems include medication side effects.  Risk factors for coronary artery disease include diabetes mellitus, hypertension, obesity and post-menopausal.  Diabetes She presents for her follow-up diabetic visit. She has type 2 diabetes mellitus. No MedicAlert identification noted. The initial diagnosis of diabetes was made 11 years ago. Her disease course has been stable. There are no hypoglycemic associated symptoms. Pertinent negatives for hypoglycemia include no dizziness or headaches.  Pertinent negatives for diabetes include no blurred vision, no chest pain, no polydipsia, no polyphagia, no polyuria and no visual change. There are no hypoglycemic complications. Symptoms are stable. There are no diabetic complications. Risk factors for coronary artery disease include dyslipidemia, hypertension, obesity and post-menopausal. Current diabetic treatment includes diet. She is compliant with treatment all of the time. Her weight is stable. She is following a diabetic and generally healthy diet. When asked about meal planning, she reported none. She has not had a previous visit with a dietician. She rarely participates in exercise. There is no change in her home blood glucose trend. (Patient doesn;t check blood sugars at home) An ACE inhibitor/angiotensin II receptor blocker is being taken. She does not see a podiatrist.Eye exam is current (Has appointment wednesday of this week.).  hypokalemia K-Dur daily- No C/O lower ext cramping    Review of Systems  Constitutional: Negative.   Eyes: Negative for blurred vision.  Respiratory: Negative.  Negative for shortness of breath.   Cardiovascular: Negative for chest pain and palpitations.  Gastrointestinal: Negative for diarrhea and constipation.  Endocrine: Negative for polydipsia, polyphagia and polyuria.  Neurological: Negative for dizziness and headaches.  Psychiatric/Behavioral: Negative.  Negative for sleep disturbance.  All other systems reviewed and are negative.      Objective:   Physical Exam  Constitutional: She is oriented to person, place, and time. She appears well-developed and well-nourished.  HENT:  Nose: Nose normal.  Mouth/Throat: Oropharynx is clear and moist.  Eyes: EOM are normal.  Neck: Trachea normal, normal range of motion and full passive range of motion without pain. Neck supple. No JVD present. Carotid bruit is not present. No thyromegaly present.  Cardiovascular: Normal rate, normal heart sounds and  intact distal pulses.  Exam reveals no  gallop and no friction rub.   No murmur heard. Irregular heart beat   Pulmonary/Chest: Effort normal and breath sounds normal.  Abdominal: Soft. Bowel sounds are normal. She exhibits no distension and no mass. There is no tenderness.  Musculoskeletal: Normal range of motion.  Lymphadenopathy:    She has no cervical adenopathy.  Neurological: She is alert and oriented to person, place, and time. She has normal reflexes.  Skin: Skin is warm and dry.  Psychiatric: She has a normal mood and affect. Her behavior is normal. Judgment and thought content normal.   BP 124/68  Pulse 78  Temp(Src) 97.6 F (36.4 C) (Oral)  Ht '5\' 2"'  (1.575 m)  Wt 155 lb 6.4 oz (70.489 kg)  BMI 28.42 kg/m2  EKG- NSR with PAC Results for orders placed in visit on 12/17/13  POCT GLYCOSYLATED HEMOGLOBIN (HGB A1C)      Result Value Ref Range   Hemoglobin A1C 5.3    HM DIABETES EYE EXAM      Result Value Ref Range   HM Diabetic Eye Exam No Retinopathy  No Retinopathy        Assessment & Plan:   1. Essential hypertension   2. Type 2 diabetes mellitus without complication   3. Glaucoma   4. Other osteoarthritis of spine, lumbar region   5. Screening for osteoporosis   6. Hypokalemia   7. Hyperlipidemia with target LDL less than 100   8. BMI 28.0-28.9,adult   9. Weight loss counseling, encounter for   10. Irregular heart beat    Orders Placed This Encounter  Procedures  . DG Bone Density    Standing Status: Future     Number of Occurrences:      Standing Expiration Date: 02/16/2015    Order Specific Question:  Reason for Exam (SYMPTOM  OR DIAGNOSIS REQUIRED)    Answer:  screening    Order Specific Question:  Preferred imaging location?    Answer:  Internal  . CMP14+EGFR  . NMR, lipoprofile  . POCT glycosylated hemoglobin (Hb A1C)  . EKG 12-Lead  . HM DIABETES EYE EXAM    This external order was created through the Results Console.   Meds ordered this  encounter  Medications  . amLODipine-benazepril (LOTREL) 5-10 MG per capsule    Sig: Take 1 capsule by mouth daily.    Dispense:  30 capsule    Refill:  5    Order Specific Question:  Supervising Provider    Answer:  Chipper Herb [[id number]]  . potassium chloride SA (K-DUR,KLOR-CON) 20 MEQ tablet    Sig: TAKE TWO TABLETS BY MOUTH DAILY    Dispense:  60 tablet    Refill:  5    Order Specific Question:  Supervising Provider    Answer:  Chipper Herb [[id number]]  . simvastatin (ZOCOR) 40 MG tablet    Sig: Take 1 tablet (40 mg total) by mouth daily.    Dispense:  30 tablet    Refill:  5    Order Specific Question:  Supervising Provider    Answer:  Chipper Herb [[id number]]   Keep mammogram appoiuntment Labs pending Health maintenance reviewed Diet and exercise encouraged Continue all meds Follow up  In 3 months   [place], FNP

## 2013-12-17 NOTE — Patient Instructions (Signed)

## 2013-12-18 LAB — NMR, LIPOPROFILE
Cholesterol: 149 mg/dL (ref 100–199)
HDL Cholesterol by NMR: 67 mg/dL (ref >39)
HDL PARTICLE NUMBER: 37.2 umol/L (ref >=30.5)
LDL Particle Number: 956 nmol/L (ref <1000)
LDL Size: 21 nm (ref >20.5)
LDLC SERPL CALC-MCNC: 63 mg/dL (ref 0–99)
LP-IR Score: <25 (ref <=45)
Small LDL Particle Number: <90 nmol/L (ref <=527)
Triglycerides by NMR: 97 mg/dL (ref 0–149)

## 2013-12-18 LAB — CMP14+EGFR
A/G RATIO: 1.2 (ref 1.1–2.5)
ALBUMIN: 4.2 g/dL (ref 3.5–4.7)
ALK PHOS: 73 IU/L (ref 39–117)
ALT: 24 IU/L (ref 0–32)
AST: 38 IU/L (ref 0–40)
BUN/Creatinine Ratio: 7 — ABNORMAL LOW (ref 11–26)
BUN: 4 mg/dL — AB (ref 8–27)
CALCIUM: 9.7 mg/dL (ref 8.7–10.3)
CO2: 26 mmol/L (ref 18–29)
Chloride: 99 mmol/L (ref 97–108)
Creatinine, Ser: 0.6 mg/dL (ref 0.57–1.00)
GFR calc Af Amer: 99 mL/min/{1.73_m2} (ref >59)
GFR, EST NON AFRICAN AMERICAN: 86 mL/min/{1.73_m2} (ref >59)
GLUCOSE: 117 mg/dL — AB (ref 65–99)
Globulin, Total: 3.4 g/dL (ref 1.5–4.5)
POTASSIUM: 3.9 mmol/L (ref 3.5–5.2)
Sodium: 141 mmol/L (ref 134–144)
TOTAL PROTEIN: 7.6 g/dL (ref 6.0–8.5)
Total Bilirubin: 0.5 mg/dL (ref 0.0–1.2)

## 2013-12-30 ENCOUNTER — Telehealth: Payer: Self-pay | Admitting: Nurse Practitioner

## 2014-01-01 NOTE — Telephone Encounter (Signed)
Collection bottle in stool cards was missing. Advised to stop by office and pick up a new pack

## 2014-01-01 NOTE — Telephone Encounter (Signed)
Not real sure what she is talking about

## 2014-03-11 ENCOUNTER — Encounter: Payer: Self-pay | Admitting: Pharmacist

## 2014-03-11 ENCOUNTER — Ambulatory Visit (INDEPENDENT_AMBULATORY_CARE_PROVIDER_SITE_OTHER): Payer: 59 | Admitting: Pharmacist

## 2014-03-11 ENCOUNTER — Ambulatory Visit (INDEPENDENT_AMBULATORY_CARE_PROVIDER_SITE_OTHER): Payer: 59

## 2014-03-11 VITALS — BP 122/62 | HR 68 | Ht 61.0 in | Wt 155.0 lb

## 2014-03-11 DIAGNOSIS — Z13820 Encounter for screening for osteoporosis: Secondary | ICD-10-CM

## 2014-03-11 DIAGNOSIS — M8080XD Other osteoporosis with current pathological fracture, unspecified site, subsequent encounter for fracture with routine healing: Secondary | ICD-10-CM

## 2014-03-11 DIAGNOSIS — M8080XA Other osteoporosis with current pathological fracture, unspecified site, initial encounter for fracture: Secondary | ICD-10-CM | POA: Insufficient documentation

## 2014-03-11 DIAGNOSIS — Z0000 Encounter for general adult medical examination without abnormal findings: Secondary | ICD-10-CM

## 2014-03-11 LAB — HM DEXA SCAN

## 2014-03-11 MED ORDER — RALOXIFENE HCL 60 MG PO TABS: 60.0000 mg | ORAL_TABLET | Freq: Every day | ORAL | Status: DC

## 2014-03-11 NOTE — Progress Notes (Signed)
Patient ID: Christina Delgado, female   DOB: 1930/10/06, 80 y.o.   MRN: [id number] Subjective:    Christina Delgado is a 80 y.o. female who presents for Medicare Initial Wellness Visit and to review and discuss DEXA / osteopenia.  Patient diagnosed with osteopenia about 12 years ago.  She took alendronate  1 weekly for 7 years.  Alendronate was stopped after DEXA in 12/2010 due to length of therapy.  She tolerated medication well.  She has a history of ankle fracture and reports last she sprained her ankle last year and Dr Case even mentioned to her that there might be a small "crack" in her bone. I was unable to locate office notes to confirm this.   Preventive Screening-Counseling & Management  Tobacco History  Smoking status  . Never Smoker   Smokeless tobacco  . Never Used    Current Problems (verified) Patient Active Problem List   Diagnosis Date Noted  . Osteoporosis with fracture 03/11/2014  . Hyperlipidemia with target LDL less than 100 12/17/2013  . Hypokalemia 12/17/2013  . BMI 28.0-28.9,adult 12/17/2013  . Hypertension 10/12/2010  . DJD (degenerative joint disease), lumbar 10/12/2010  . Glaucoma 10/12/2010  . Diabetes mellitus 10/12/2010  . Hepatitis 10/12/2010    Medications Prior to Visit Current Outpatient Prescriptions on File Prior to Visit  Medication Sig Dispense Refill  . amLODipine-benazepril (LOTREL) 5-10 MG per capsule Take 1 capsule by mouth daily.  30 capsule  5  . Calcium Carbonate-Vitamin D (CALCIUM 600+D) 600-400 MG-UNIT per tablet Take 1 tablet by mouth daily.      Marland Kitchen latanoprost (XALATAN) 0.005 % ophthalmic solution Place 1 drop into the left eye at bedtime.      . Misc Natural Products (OSTEO BI-FLEX TRIPLE STRENGTH) TABS Take 4,000 each by mouth daily. Patient takes 2000 mg(2 tablets at one time daily)      . Multiple Vitamin (MULTIVITAMIN) tablet Take 1 tablet by mouth daily.      . Omega-3 Fatty Acids (FISH OIL) 1200 MG CAPS Take 1,200 mg by  mouth 3 (three) times daily.      . potassium chloride SA (K-DUR,KLOR-CON) 20 MEQ tablet TAKE TWO TABLETS BY MOUTH DAILY  60 tablet  5  . simvastatin (ZOCOR) 40 MG tablet Take 1 tablet (40 mg total) by mouth daily.  30 tablet  5   No current facility-administered medications on file prior to visit.    Current Medications (verified) Current Outpatient Prescriptions  Medication Sig Dispense Refill  . amLODipine-benazepril (LOTREL) 5-10 MG per capsule Take 1 capsule by mouth daily.  30 capsule  5  . Calcium Carbonate-Vitamin D (CALCIUM 600+D) 600-400 MG-UNIT per tablet Take 1 tablet by mouth daily.      Marland Kitchen latanoprost (XALATAN) 0.005 % ophthalmic solution Place 1 drop into the left eye at bedtime.      . Misc Natural Products (OSTEO BI-FLEX TRIPLE STRENGTH) TABS Take 4,000 each by mouth daily. Patient takes 2000 mg(2 tablets at one time daily)      . Multiple Vitamin (MULTIVITAMIN) tablet Take 1 tablet by mouth daily.      . Omega-3 Fatty Acids (FISH OIL) 1200 MG CAPS Take 1,200 mg by mouth 3 (three) times daily.      . potassium chloride SA (K-DUR,KLOR-CON) 20 MEQ tablet TAKE TWO TABLETS BY MOUTH DAILY  60 tablet  5  . simvastatin (ZOCOR) 40 MG tablet Take 1 tablet (40 mg total) by mouth daily.  30 tablet  5  No current facility-administered medications for this visit.     Allergies (verified) Review of patient's allergies indicates no known allergies.   PAST HISTORY  Family History Family History  Problem Relation Age of Onset  . Stroke Father 70  . Diabetes Sister   . Stroke Mother 21  . Diabetes Mother   . Stroke Brother   . Hypertension Sister   . Hyperlipidemia Sister   . Cancer Brother     LUNG  . Diabetes Brother     Social History History  Substance Use Topics  . Smoking status: Never Smoker   . Smokeless tobacco: Never Used  . Alcohol Use: No     Are there smokers in your home (other than you)? No  Risk Factors Current exercise habits: Home exercise routine  includes STATIONARY BICYCLE.  Dietary issues discussed: Discussed health diet to prevent diabetes and heart disease  Cardiac risk factors: advanced age (older than 8 for men, 82 for women), family history of premature cardiovascular disease and hypertension.  Depression Screen (Note: if answer to either of the following is "Yes", a more complete depression screening is indicated)   Over the past 2 weeks, have you felt down, depressed or hopeless? Yes  Over the past 2 weeks, have you felt little interest or pleasure in doing things? No  Have you lost interest or pleasure in daily life? Yes  Do you often feel hopeless? No  Do you cry easily over simple problems? No  Activities of Daily Living In your present state of health, do you have any difficulty performing the following activities?:  Driving? No Managing money?  No Feeding yourself? No Getting from bed to chair? No  Climbing a flight of stairs? No - uses railing and walks with 4 pronged cane Preparing food and eating?: No Bathing or showering? No Getting dressed: No Getting to the toilet? No Using the toilet:No Moving around from place to place: No In the past year have you fallen or had a near fall?:No   Are you sexually active?  Yes  Do you have more than one partner?  No  Hearing Difficulties: No Do you often ask people to speak up or repeat themselves? No Do you experience ringing or noises in your ears? No Do you have difficulty understanding soft or whispered voices? No   Do you feel that you have a problem with memory? No  Do you often misplace items? No  Do you feel safe at home?  Yes  Cognitive Testing  Alert? Yes  Normal Appearance?Yes  Oriented to person? Yes  Place? Yes   Time? Yes  Recall of three objects?  Yes  Can perform simple calculations? Yes  Displays appropriate judgment?Yes  Can read the correct time from a watch face?Yes   Advanced Directives have been discussed with the patient?  Yes  List the Names of Other Physician/Practitioners you currently use: 1.  Dr Case - ortho 2.  Dr Lita Mains - ophthalmologist    Indicate any recent Medical Services you may have received from other than [facility] providers in the past year (date may be approximate).  Immunization History  Administered Date(s) Administered  . Influenza,inj,Quad PF,36+ Mos 04/02/2013  . Pneumococcal Conjugate-13 04/26/2009  . Td 03/27/2007  . Zoster 03/10/2013    Screening Tests Health Maintenance  Topic Date Due  . Influenza Vaccine  01/24/2014  . Urine Microalbumin  03/10/2014  . Hemoglobin A1c  06/18/2014  . Ophthalmology Exam  12/02/2014  . Foot Exam  12/18/2014  . Mammogram  01/20/2015  . Tetanus/tdap  03/26/2017  . Colonoscopy  04/27/2019  . Pneumococcal Polysaccharide Vaccine Age 35 And Over  Completed  . Zostavax  Completed   Last eye exam 11/2013 per patient  All answers were reviewed with the patient and necessary referrals were made:  Henrene Pastor, [facility]   03/11/2014   History reviewed: allergies, current medications, past family history, past medical history, past social history, past surgical history and problem list    Objective:    Body mass index is 29.3 kg/(m^2). Ht  (1.549 m)  Wt 155 lb (70.308 kg)  BMI 29.30 kg/m2  DEXA Results Date of Test T-Score for AP Spine L1-L4 T-Score for Total Left Hip T-Score for Total Right Hip T-Score for neck of left hip T-Score for Neck of Right Hip  03/11/2014 0.9 -0.7 -0.4 -2.0 -1.4  04/12/2011 1.1 -0.6 -0.3 -1.6 -1.3  10/21/2008 0.9 -0.6 -0.4 -1.7 -1.4  08/20/2006 0.2 -0.7 -0.4 -1.8 -1.3   FRAX 10 year estimate: Total FX risk:  21%  (consider medication if >/= 20%) Hip FX risk:  5%  (consider medication if >/= 3%)    Assessment:     Initial Medicare Wellness Visit Osteopenia with significant decrease in neck of left hip T-Score - high fracture risk due to FRAX estimate     Plan:     During the course of the visit the  patient was educated and counseled about appropriate screening and preventive services including:    Pneumococcal vaccine - completed  Influenza vaccine - has appt to get  Td vaccine - UTD  Screening electrocardiogram - UTD  Screening mammography - UTD  Screening Pap smear and pelvic exam - no longer required  Bone densitometry screening - done today  Colorectal cancer screening - UTD  Glaucoma screening - UTD, records requested from Dr Lita Mains office  Nutrition counseling - Discussed limiting high calorie and high CHO foods  Advanced directives: patient already has Advanced Directeive packet at home.  Discussed benefits of havin gthis on file - patient plans to review soon.  Referral to Physical therapy for balance assessment  Start Evista  1 tablet daily for osteopenia   continue calcium  daily through supplementation or diet.   recommend weight bearing exercise - 30 minutes at least 4 days per week.    Counseled and educated about fall risk and prevention.  Recheck DEXA:  2 years    Patient Instructions (the written plan) was given to the patient.  Medicare Attestation I have personally reviewed: The patient's medical and social history Their use of alcohol, tobacco or illicit drugs Their current medications and supplements The patient's functional ability including ADLs,fall risks, home safety risks, cognitive, and hearing and visual impairment Diet and physical activities Evidence for depression or mood disorders  The patient's weight, height, BMI, and BP/HR have been recorded in the chart.  I have made referrals, counseling, and provided education to the patient based on review of the above and I have provided the patient with a written personalized care plan for preventive services.     Henrene Pastor, [facility]   03/11/2014

## 2014-03-11 NOTE — Patient Instructions (Signed)
Health Maintenance Summary    INFLUENZA VACCINE Overdue 01/24/2014  Will get Fall 2015    Dexa / Bone Density Next Due  03/11/2016 Last was 03/11/2014    Shingles vaccine / Zostavax Completed 03/10/2014      OPHTHALMOLOGY EXAM Next Due 12/02/2014  Last was 12/01/2013    Pneumonia Vaccine Completed 04/26/2009      MAMMOGRAM Next Due 01/20/2015      TETANUS/TDAP Next Due 03/26/2017  Last was 03/27/2007    COLONOSCOPY Next Due 04/27/2019        Fall Prevention and Home Safety Falls cause injuries and can affect all age groups. It is possible to use preventive measures to significantly decrease the likelihood of falls. There are many simple measures which can make your home safer and prevent falls. OUTDOORS  Repair cracks and edges of walkways and driveways.  Remove high doorway thresholds.  Trim shrubbery on the main path into your home.  Have good outside lighting.  Clear walkways of tools, rocks, debris, and clutter.  Check that handrails are not broken and are securely fastened. Both sides of steps should have handrails.  Have leaves, snow, and ice cleared regularly.  Use sand or salt on walkways during winter months.  In the garage, clean up grease or oil spills. BATHROOM  Install night lights.  Install grab bars by the toilet and in the tub and shower.  Use non-skid mats or decals in the tub or shower.  Place a plastic non-slip stool in the shower to sit on, if needed.  Keep floors dry and clean up all water on the floor immediately.  Remove soap buildup in the tub or shower on a regular basis.  Secure bath mats with non-slip, double-sided rug tape.  Remove throw rugs and tripping hazards from the floors. BEDROOMS  Install night lights.  Make sure a bedside light is easy to reach.  Do not use oversized bedding.  Keep a telephone by your bedside.  Have a firm chair with side arms to use for getting dressed.  Remove throw rugs and tripping hazards from the  floor. KITCHEN  Keep handles on pots and pans turned toward the center of the stove. Use back burners when possible.  Clean up spills quickly and allow time for drying.  Avoid walking on wet floors.  Avoid hot utensils and knives.  Position shelves so they are not too high or low.  Place commonly used objects within easy reach.  If necessary, use a sturdy step stool with a grab bar when reaching.  Keep electrical cables out of the way.  Do not use floor polish or wax that makes floors slippery. If you must use wax, use non-skid floor wax.  Remove throw rugs and tripping hazards from the floor. STAIRWAYS  Never leave objects on stairs.  Place handrails on both sides of stairways and use them. Fix any loose handrails. Make sure handrails on both sides of the stairways are as long as the stairs.  Check carpeting to make sure it is firmly attached along stairs. Make repairs to worn or loose carpet promptly.  Avoid placing throw rugs at the top or bottom of stairways, or properly secure the rug with carpet tape to prevent slippage. Get rid of throw rugs, if possible.  Have an electrician put in a light switch at the top and bottom of the stairs. OTHER FALL PREVENTION TIPS  Wear low-heel or rubber-soled shoes that are supportive and fit well. Wear closed toe shoes.  When using a stepladder, make sure it is fully opened and both spreaders are firmly locked. Do not climb a closed stepladder.  Add color or contrast paint or tape to grab bars and handrails in your home. Place contrasting color strips on first and last steps.  Learn and use mobility aids as needed. Install an electrical emergency response system.  Turn on lights to avoid dark areas. Replace light bulbs that burn out immediately. Get light switches that glow.  Arrange furniture to create clear pathways. Keep furniture in the same place.  Firmly attach carpet with non-skid or double-sided tape.  Eliminate uneven  floor surfaces.  Select a carpet pattern that does not visually hide the edge of steps.  Be aware of all pets. OTHER HOME SAFETY TIPS  Set the water temperature for 120 F (48.8 C).  Keep emergency numbers on or near the telephone.  Keep smoke detectors on every level of the home and near sleeping areas. Document Released: 06/02/2002 Document Revised: 12/12/2011 Document Reviewed: 09/01/2011 [facility] Patient Information 2015 [place], [place]. This information is not intended to replace advice given to you by your health care provider. Make sure you discuss any questions you have with your health care provider.   Preventive Care for Adults A healthy lifestyle and preventive care can promote health and wellness. Preventive health guidelines for women include the following key practices.  A routine yearly physical is a good way to check with your health care provider about your health and preventive screening. It is a chance to share any concerns and updates on your health and to receive a thorough exam.  Visit your dentist for a routine exam and preventive care every 6 months. Brush your teeth twice a day and floss once a day. Good oral hygiene prevents tooth decay and gum disease.  The frequency of eye exams is based on your age, health, family medical history, use of contact lenses, and other factors. Follow your health care provider's recommendations for frequency of eye exams.  Eat a healthy diet. Foods like vegetables, fruits, whole grains, low-fat dairy products, and lean protein foods contain the nutrients you need without too many calories. Decrease your intake of foods high in solid fats, added sugars, and salt. Eat the right amount of calories for you.Get information about a proper diet from your health care provider, if necessary.  Regular physical exercise is one of the most important things you can do for your health. Most adults should get at least 150 minutes of  moderate-intensity exercise (any activity that increases your heart rate and causes you to sweat) each week. In addition, most adults need muscle-strengthening exercises on 2 or more days a week.  Maintain a healthy weight. The body mass index (BMI) is a screening tool to identify possible weight problems. It provides an estimate of body fat based on height and weight. Your health care provider can find your BMI and can help you achieve or maintain a healthy weight.For adults 20 years and older:  A BMI below 18.5 is considered underweight.  A BMI of 18.5 to 24.9 is normal.  A BMI of 25 to 29.9 is considered overweight.  A BMI of 30 and above is considered obese.  Maintain normal blood lipids and cholesterol levels by exercising and minimizing your intake of saturated fat. Eat a balanced diet with plenty of fruit and vegetables. Blood tests for lipids and cholesterol should begin at age 82 and be repeated every 5 years. If your lipid  or cholesterol levels are high, you are over 50, or you are at high risk for heart disease, you may need your cholesterol levels checked more frequently.Ongoing high lipid and cholesterol levels should be treated with medicines if diet and exercise are not working.  If you smoke, find out from your health care provider how to quit. If you do not use tobacco, do not start.  Lung cancer screening is recommended for adults aged 24-80 years who are at high risk for developing lung cancer because of a history of smoking. A yearly low-dose CT scan of the lungs is recommended for people who have at least a 30-pack-year history of smoking and are a current smoker or have quit within the past 15 years. A pack year of smoking is smoking an average of 1 pack of cigarettes a day for 1 year (for example: 1 pack a day for 30 years or 2 packs a day for 15 years). Yearly screening should continue until the smoker has stopped smoking for at least 15 years. Yearly screening should be  stopped for people who develop a health problem that would prevent them from having lung cancer treatment.  If you are pregnant, do not drink alcohol. If you are breastfeeding, be very cautious about drinking alcohol. If you are not pregnant and choose to drink alcohol, do not have more than 1 drink per day. One drink is considered to be 12 ounces (355 mL) of beer, 5 ounces (148 mL) of wine, or 1.5 ounces (44 mL) of liquor.  Avoid use of street drugs. Do not share needles with anyone. Ask for help if you need support or instructions about stopping the use of drugs.  High blood pressure causes heart disease and increases the risk of stroke. Your blood pressure should be checked at least every 1 to 2 years. Ongoing high blood pressure should be treated with medicines if weight loss and exercise do not work.  If you are 15-39 years old, ask your health care provider if you should take aspirin to prevent strokes.  Diabetes screening involves taking a blood sample to check your fasting blood sugar level. This should be done once every 3 years, after age 54, if you are within normal weight and without risk factors for diabetes. Testing should be considered at a younger age or be carried out more frequently if you are overweight and have at least 1 risk factor for diabetes.  Breast cancer screening is essential preventive care for women. You should practice "breast self-awareness." This means understanding the normal appearance and feel of your breasts and may include breast self-examination. Any changes detected, no matter how small, should be reported to a health care provider. Women in their 34s and 30s should have a clinical breast exam (CBE) by a health care provider as part of a regular health exam every 1 to 3 years. After age 68, women should have a CBE every year. Starting at age 62, women should consider having a mammogram (breast X-ray test) every year. Women who have a family history of breast  cancer should talk to their health care provider about genetic screening. Women at a high risk of breast cancer should talk to their health care providers about having an MRI and a mammogram every year.  Breast cancer gene (BRCA)-related cancer risk assessment is recommended for women who have family members with BRCA-related cancers. BRCA-related cancers include breast, ovarian, tubal, and peritoneal cancers. Having family members with these cancers may be associated  with an increased risk for harmful changes (mutations) in the breast cancer genes BRCA1 and BRCA2. Results of the assessment will determine the need for genetic counseling and BRCA1 and BRCA2 testing.  Routine pelvic exams to screen for cancer are no longer recommended for nonpregnant women who are considered low risk for cancer of the pelvic organs (ovaries, uterus, and vagina) and who do not have symptoms. Ask your health care provider if a screening pelvic exam is right for you.  If you have had past treatment for cervical cancer or a condition that could lead to cancer, you need Pap tests and screening for cancer for at least 20 years after your treatment. If Pap tests have been discontinued, your risk factors (such as having a new sexual partner) need to be reassessed to determine if screening should be resumed. Some women have medical problems that increase the chance of getting cervical cancer. In these cases, your health care provider may recommend more frequent screening and Pap tests.  The HPV test is an additional test that may be used for cervical cancer screening. The HPV test looks for the virus that can cause the cell changes on the cervix. The cells collected during the Pap test can be tested for HPV. The HPV test could be used to screen women aged 6 years and older, and should be used in women of any age who have unclear Pap test results. After the age of 30, women should have HPV testing at the same frequency as a Pap  test.  Colorectal cancer can be detected and often prevented. Most routine colorectal cancer screening begins at the age of 32 years and continues through age 41 years. However, your health care provider may recommend screening at an earlier age if you have risk factors for colon cancer. On a yearly basis, your health care provider may provide home test kits to check for hidden blood in the stool. Use of a small camera at the end of a tube, to directly examine the colon (sigmoidoscopy or colonoscopy), can detect the earliest forms of colorectal cancer. Talk to your health care provider about this at age 46, when routine screening begins. Direct exam of the colon should be repeated every 5-10 years through age 72 years, unless early forms of pre-cancerous polyps or small growths are found.  People who are at an increased risk for hepatitis B should be screened for this virus. You are considered at high risk for hepatitis B if:  You were born in a country where hepatitis B occurs often. Talk with your health care provider about which countries are considered high risk.  Your parents were born in a high-risk country and you have not received a shot to protect against hepatitis B (hepatitis B vaccine).  You have HIV or AIDS.  You use needles to inject street drugs.  You live with, or have sex with, someone who has hepatitis B.  You get hemodialysis treatment.  You take certain medicines for conditions like cancer, organ transplantation, and autoimmune conditions.  Hepatitis C blood testing is recommended for all people born from [birth year] through 1963 and any individual with known risks for hepatitis C.  Practice safe sex. Use condoms and avoid high-risk sexual practices to reduce the spread of sexually transmitted infections (STIs). STIs include gonorrhea, chlamydia, syphilis, trichomonas, herpes, HPV, and human immunodeficiency virus (HIV). Herpes, HIV, and HPV are viral illnesses that have no cure.  They can result in disability, cancer, and death.  You  should be screened for sexually transmitted illnesses (STIs) including gonorrhea and chlamydia if:  You are sexually active and are younger than 24 years.  You are older than 24 years and your health care provider tells you that you are at risk for this type of infection.  Your sexual activity has changed since you were last screened and you are at an increased risk for chlamydia or gonorrhea. Ask your health care provider if you are at risk.  If you are at risk of being infected with HIV, it is recommended that you take a prescription medicine daily to prevent HIV infection. This is called preexposure prophylaxis (PrEP). You are considered at risk if:  You are a heterosexual woman, are sexually active, and are at increased risk for HIV infection.  You take drugs by injection.  You are sexually active with a partner who has HIV.  Talk with your health care provider about whether you are at high risk of being infected with HIV. If you choose to begin PrEP, you should first be tested for HIV. You should then be tested every 3 months for as long as you are taking PrEP.  Osteoporosis is a disease in which the bones lose minerals and strength with aging. This can result in serious bone fractures or breaks. The risk of osteoporosis can be identified using a bone density scan. Women ages 63 years and over and women at risk for fractures or osteoporosis should discuss screening with their health care providers. Ask your health care provider whether you should take a calcium supplement or vitamin D to reduce the rate of osteoporosis.  Menopause can be associated with physical symptoms and risks. Hormone replacement therapy is available to decrease symptoms and risks. You should talk to your health care provider about whether hormone replacement therapy is right for you.  Use sunscreen. Apply sunscreen liberally and repeatedly throughout the day.  You should seek shade when your shadow is shorter than you. Protect yourself by wearing long sleeves, pants, a wide-brimmed hat, and sunglasses year round, whenever you are outdoors.  Once a month, do a whole body skin exam, using a mirror to look at the skin on your back. Tell your health care provider of new moles, moles that have irregular borders, moles that are larger than a pencil eraser, or moles that have changed in shape or color.  Stay current with required vaccines (immunizations).  Influenza vaccine. All adults should be immunized every year.  Tetanus, diphtheria, and acellular pertussis (Td, Tdap) vaccine. Pregnant women should receive 1 dose of Tdap vaccine during each pregnancy. The dose should be obtained regardless of the length of time since the last dose. Immunization is preferred during the 27th-36th week of gestation. An adult who has not previously received Tdap or who does not know her vaccine status should receive 1 dose of Tdap. This initial dose should be followed by tetanus and diphtheria toxoids (Td) booster doses every 10 years. Adults with an unknown or incomplete history of completing a 3-dose immunization series with Td-containing vaccines should begin or complete a primary immunization series including a Tdap dose. Adults should receive a Td booster every 10 years.  Varicella vaccine. An adult without evidence of immunity to varicella should receive 2 doses or a second dose if she has previously received 1 dose. Pregnant females who do not have evidence of immunity should receive the first dose after pregnancy. This first dose should be obtained before leaving the health care facility. The  second dose should be obtained 4-8 weeks after the first dose.  Human papillomavirus (HPV) vaccine. Females aged 13-26 years who have not received the vaccine previously should obtain the 3-dose series. The vaccine is not recommended for use in pregnant females. However, pregnancy  testing is not needed before receiving a dose. If a female is found to be pregnant after receiving a dose, no treatment is needed. In that case, the remaining doses should be delayed until after the pregnancy. Immunization is recommended for any person with an immunocompromised condition through the age of 55 years if she did not get any or all doses earlier. During the 3-dose series, the second dose should be obtained 4-8 weeks after the first dose. The third dose should be obtained 24 weeks after the first dose and 16 weeks after the second dose.  Zoster vaccine. One dose is recommended for adults aged 73 years or older unless certain conditions are present.  Measles, mumps, and rubella (MMR) vaccine. Adults born before [birth year] generally are considered immune to measles and mumps. Adults born in [birth year] or later should have 1 or more doses of MMR vaccine unless there is a contraindication to the vaccine or there is laboratory evidence of immunity to each of the three diseases. A routine second dose of MMR vaccine should be obtained at least 28 days after the first dose for students attending postsecondary schools, health care workers, or international travelers. People who received inactivated measles vaccine or an unknown type of measles vaccine during 1963-1967 should receive 2 doses of MMR vaccine. People who received inactivated mumps vaccine or an unknown type of mumps vaccine before 1979 and are at high risk for mumps infection should consider immunization with 2 doses of MMR vaccine. For females of childbearing age, rubella immunity should be determined. If there is no evidence of immunity, females who are not pregnant should be vaccinated. If there is no evidence of immunity, females who are pregnant should delay immunization until after pregnancy. Unvaccinated health care workers born before [birth year] who lack laboratory evidence of measles, mumps, or rubella immunity or laboratory confirmation of disease should  consider measles and mumps immunization with 2 doses of MMR vaccine or rubella immunization with 1 dose of MMR vaccine.  Pneumococcal 13-valent conjugate (PCV13) vaccine. When indicated, a person who is uncertain of her immunization history and has no record of immunization should receive the PCV13 vaccine. An adult aged 42 years or older who has certain medical conditions and has not been previously immunized should receive 1 dose of PCV13 vaccine. This PCV13 should be followed with a dose of pneumococcal polysaccharide (PPSV23) vaccine. The PPSV23 vaccine dose should be obtained at least 8 weeks after the dose of PCV13 vaccine. An adult aged 86 years or older who has certain medical conditions and previously received 1 or more doses of PPSV23 vaccine should receive 1 dose of PCV13. The PCV13 vaccine dose should be obtained 1 or more years after the last PPSV23 vaccine dose.  Pneumococcal polysaccharide (PPSV23) vaccine. When PCV13 is also indicated, PCV13 should be obtained first. All adults aged 57 years and older should be immunized. An adult younger than age 61 years who has certain medical conditions should be immunized. Any person who resides in a nursing home or long-term care facility should be immunized. An adult smoker should be immunized. People with an immunocompromised condition and certain other conditions should receive both PCV13 and PPSV23 vaccines. People with human immunodeficiency virus (HIV) infection should be  immunized as soon as possible after diagnosis. Immunization during chemotherapy or radiation therapy should be avoided. Routine use of PPSV23 vaccine is not recommended for American Indians, [place] Natives, or people younger than 65 years unless there are medical conditions that require PPSV23 vaccine. When indicated, people who have unknown immunization and have no record of immunization should receive PPSV23 vaccine. One-time revaccination 5 years after the first dose of PPSV23 is  recommended for people aged 19-64 years who have chronic kidney failure, nephrotic syndrome, asplenia, or immunocompromised conditions. People who received 1-2 doses of PPSV23 before age 46 years should receive another dose of PPSV23 vaccine at age 30 years or later if at least 5 years have passed since the previous dose. Doses of PPSV23 are not needed for people immunized with PPSV23 at or after age 63 years.  Meningococcal vaccine. Adults with asplenia or persistent complement component deficiencies should receive 2 doses of quadrivalent meningococcal conjugate (MenACWY-D) vaccine. The doses should be obtained at least 2 months apart. Microbiologists working with certain meningococcal bacteria, [employer] recruits, people at risk during an outbreak, and people who travel to or live in countries with a high rate of meningitis should be immunized. A first-year college student up through age 10 years who is living in a residence hall should receive a dose if she did not receive a dose on or after her 16th birthday. Adults who have certain high-risk conditions should receive one or more doses of vaccine.  Hepatitis A vaccine. Adults who wish to be protected from this disease, have certain high-risk conditions, work with hepatitis A-infected animals, work in hepatitis A research labs, or travel to or work in countries with a high rate of hepatitis A should be immunized. Adults who were previously unvaccinated and who anticipate close contact with an international adoptee during the first 60 days after arrival in the [place] from a country with a high rate of hepatitis A should be immunized.  Hepatitis B vaccine. Adults who wish to be protected from this disease, have certain high-risk conditions, may be exposed to blood or other infectious body fluids, are household contacts or sex partners of hepatitis B positive people, are clients or workers in certain care facilities, or travel to or work in countries  with a high rate of hepatitis B should be immunized.  Haemophilus influenzae type b (Hib) vaccine. A previously unvaccinated person with asplenia or sickle cell disease or having a scheduled splenectomy should receive 1 dose of Hib vaccine. Regardless of previous immunization, a recipient of a hematopoietic stem cell transplant should receive a 3-dose series 6-12 months after her successful transplant. Hib vaccine is not recommended for adults with HIV infection. Preventive Services / Frequency Ages 46 to 25 years  Blood pressure check.** / Every 1 to 2 years.  Lipid and cholesterol check.** / Every 5 years beginning at age 28.  Clinical breast exam.** / Every 3 years for women in their 21s and 76s.  BRCA-related cancer risk assessment.** / For women who have family members with a BRCA-related cancer (breast, ovarian, tubal, or peritoneal cancers).  Pap test.** / Every 2 years from ages 71 through 47. Every 3 years starting at age 102 through age 60 or 25 with a history of 3 consecutive normal Pap tests.  HPV screening.** / Every 3 years from ages 68 through ages 37 to 82 with a history of 3 consecutive normal Pap tests.  Hepatitis C blood test.** / For any individual with known risks for  hepatitis C.  Skin self-exam. / Monthly.  Influenza vaccine. / Every year.  Tetanus, diphtheria, and acellular pertussis (Tdap, Td) vaccine.** / Consult your health care provider. Pregnant women should receive 1 dose of Tdap vaccine during each pregnancy. 1 dose of Td every 10 years.  Varicella vaccine.** / Consult your health care provider. Pregnant females who do not have evidence of immunity should receive the first dose after pregnancy.  HPV vaccine. / 3 doses over 6 months, if 58 and younger. The vaccine is not recommended for use in pregnant females. However, pregnancy testing is not needed before receiving a dose.  Measles, mumps, rubella (MMR) vaccine.** / You need at least 1 dose of MMR if you  were born in 1957 or later. You may also need a 2nd dose. For females of childbearing age, rubella immunity should be determined. If there is no evidence of immunity, females who are not pregnant should be vaccinated. If there is no evidence of immunity, females who are pregnant should delay immunization until after pregnancy.  Pneumococcal 13-valent conjugate (PCV13) vaccine.** / Consult your health care provider.  Pneumococcal polysaccharide (PPSV23) vaccine.** / 1 to 2 doses if you smoke cigarettes or if you have certain conditions.  Meningococcal vaccine.** / 1 dose if you are age 44 to 47 years and a Market researcher living in a residence hall, or have one of several medical conditions, you need to get vaccinated against meningococcal disease. You may also need additional booster doses.  Hepatitis A vaccine.** / Consult your health care provider.  Hepatitis B vaccine.** / Consult your health care provider.  Haemophilus influenzae type b (Hib) vaccine.** / Consult your health care provider. Ages 55 to 33 years  Blood pressure check.** / Every 1 to 2 years.  Lipid and cholesterol check.** / Every 5 years beginning at age 12 years.  Lung cancer screening. / Every year if you are aged 59-80 years and have a 30-pack-year history of smoking and currently smoke or have quit within the past 15 years. Yearly screening is stopped once you have quit smoking for at least 15 years or develop a health problem that would prevent you from having lung cancer treatment.  Clinical breast exam.** / Every year after age 43 years.  BRCA-related cancer risk assessment.** / For women who have family members with a BRCA-related cancer (breast, ovarian, tubal, or peritoneal cancers).  Mammogram.** / Every year beginning at age 92 years and continuing for as long as you are in good health. Consult with your health care provider.  Pap test.** / Every 3 years starting at age 75 years through age 52 or  87 years with a history of 3 consecutive normal Pap tests.  HPV screening.** / Every 3 years from ages 52 years through ages 22 to 15 years with a history of 3 consecutive normal Pap tests.  Fecal occult blood test (FOBT) of stool. / Every year beginning at age 32 years and continuing until age 13 years. You may not need to do this test if you get a colonoscopy every 10 years.  Flexible sigmoidoscopy or colonoscopy.** / Every 5 years for a flexible sigmoidoscopy or every 10 years for a colonoscopy beginning at age 33 years and continuing until age 22 years.  Hepatitis C blood test.** / For all people born from [birth year] through 1963 and any individual with known risks for hepatitis C.  Skin self-exam. / Monthly.  Influenza vaccine. / Every year.  Tetanus, diphtheria, and acellular pertussis (  Tdap/Td) vaccine.** / Consult your health care provider. Pregnant women should receive 1 dose of Tdap vaccine during each pregnancy. 1 dose of Td every 10 years.  Varicella vaccine.** / Consult your health care provider. Pregnant females who do not have evidence of immunity should receive the first dose after pregnancy.  Zoster vaccine.** / 1 dose for adults aged 19 years or older.  Measles, mumps, rubella (MMR) vaccine.** / You need at least 1 dose of MMR if you were born in 1957 or later. You may also need a 2nd dose. For females of childbearing age, rubella immunity should be determined. If there is no evidence of immunity, females who are not pregnant should be vaccinated. If there is no evidence of immunity, females who are pregnant should delay immunization until after pregnancy.  Pneumococcal 13-valent conjugate (PCV13) vaccine.** / Consult your health care provider.  Pneumococcal polysaccharide (PPSV23) vaccine.** / 1 to 2 doses if you smoke cigarettes or if you have certain conditions.  Meningococcal vaccine.** / Consult your health care provider.  Hepatitis A vaccine.** / Consult your health care  provider.  Hepatitis B vaccine.** / Consult your health care provider.  Haemophilus influenzae type b (Hib) vaccine.** / Consult your health care provider. Ages 3 years and over  Blood pressure check.** / Every 1 to 2 years.  Lipid and cholesterol check.** / Every 5 years beginning at age 13 years.  Lung cancer screening. / Every year if you are aged 109-80 years and have a 30-pack-year history of smoking and currently smoke or have quit within the past 15 years. Yearly screening is stopped once you have quit smoking for at least 15 years or develop a health problem that would prevent you from having lung cancer treatment.  Clinical breast exam.** / Every year after age 27 years.  BRCA-related cancer risk assessment.** / For women who have family members with a BRCA-related cancer (breast, ovarian, tubal, or peritoneal cancers).  Mammogram.** / Every year beginning at age 36 years and continuing for as long as you are in good health. Consult with your health care provider.  Pap test.** / Every 3 years starting at age 79 years through age 79 or 58 years with 3 consecutive normal Pap tests. Testing can be stopped between 65 and 70 years with 3 consecutive normal Pap tests and no abnormal Pap or HPV tests in the past 10 years.  HPV screening.** / Every 3 years from ages 13 years through ages 8 or 58 years with a history of 3 consecutive normal Pap tests. Testing can be stopped between 65 and 70 years with 3 consecutive normal Pap tests and no abnormal Pap or HPV tests in the past 10 years.  Fecal occult blood test (FOBT) of stool. / Every year beginning at age 71 years and continuing until age 65 years. You may not need to do this test if you get a colonoscopy every 10 years.  Flexible sigmoidoscopy or colonoscopy.** / Every 5 years for a flexible sigmoidoscopy or every 10 years for a colonoscopy beginning at age 28 years and continuing until age 26 years.  Hepatitis C blood test.** / For  all people born from [birth year] through 1965 and any individual with known risks for hepatitis C.  Osteoporosis screening.** / A one-time screening for women ages 74 years and over and women at risk for fractures or osteoporosis.  Skin self-exam. / Monthly.  Influenza vaccine. / Every year.  Tetanus, diphtheria, and acellular pertussis (Tdap/Td) vaccine.** / 1  dose of Td every 10 years.  Varicella vaccine.** / Consult your health care provider.  Zoster vaccine.** / 1 dose for adults aged 34 years or older.  Pneumococcal 13-valent conjugate (PCV13) vaccine.** / Consult your health care provider.  Pneumococcal polysaccharide (PPSV23) vaccine.** / 1 dose for all adults aged 24 years and older.  Meningococcal vaccine.** / Consult your health care provider.  Hepatitis A vaccine.** / Consult your health care provider.  Hepatitis B vaccine.** / Consult your health care provider.  Haemophilus influenzae type b (Hib) vaccine.** / Consult your health care provider. ** Family history and personal history of risk and conditions may change your health care provider's recommendations. Document Released: 08/08/2001 Document Revised: 10/27/2013 Document Reviewed: 11/07/2010 [facility] Patient Information 2015 [place], [place]. This information is not intended to replace advice given to you by your health care provider. Make sure you discuss any questions you have with your health care provider.

## 2014-03-20 ENCOUNTER — Encounter: Payer: Self-pay | Admitting: Nurse Practitioner

## 2014-03-20 ENCOUNTER — Ambulatory Visit (INDEPENDENT_AMBULATORY_CARE_PROVIDER_SITE_OTHER): Payer: 59 | Admitting: Nurse Practitioner

## 2014-03-20 VITALS — BP 127/77 | HR 90 | Temp 96.9°F | Ht 61.0 in | Wt 157.0 lb

## 2014-03-20 DIAGNOSIS — Z6828 Body mass index (BMI) 28.0-28.9, adult: Secondary | ICD-10-CM

## 2014-03-20 DIAGNOSIS — E876 Hypokalemia: Secondary | ICD-10-CM

## 2014-03-20 DIAGNOSIS — E785 Hyperlipidemia, unspecified: Secondary | ICD-10-CM

## 2014-03-20 DIAGNOSIS — I10 Essential (primary) hypertension: Secondary | ICD-10-CM

## 2014-03-20 DIAGNOSIS — E119 Type 2 diabetes mellitus without complications: Secondary | ICD-10-CM

## 2014-03-20 DIAGNOSIS — M8448XD Pathological fracture, other site, subsequent encounter for fracture with routine healing: Secondary | ICD-10-CM

## 2014-03-20 DIAGNOSIS — M8080XD Other osteoporosis with current pathological fracture, unspecified site, subsequent encounter for fracture with routine healing: Secondary | ICD-10-CM

## 2014-03-20 LAB — POCT UA - MICROALBUMIN: Microalbumin Ur, POC: 20 mg/L

## 2014-03-20 LAB — POCT GLYCOSYLATED HEMOGLOBIN (HGB A1C): Hemoglobin A1C: 5.5

## 2014-03-20 MED ORDER — POTASSIUM CHLORIDE CRYS ER 20 MEQ PO TBCR: EXTENDED_RELEASE_TABLET | ORAL | Status: DC

## 2014-03-20 MED ORDER — AMLODIPINE BESY-BENAZEPRIL HCL 5-10 MG PO CAPS: 1.0000 | ORAL_CAPSULE | Freq: Every day | ORAL | Status: DC

## 2014-03-20 MED ORDER — RALOXIFENE HCL 60 MG PO TABS: 60.0000 mg | ORAL_TABLET | Freq: Every day | ORAL | Status: DC

## 2014-03-20 NOTE — Progress Notes (Signed)
Subjective:    Patient ID: Christina Delgado, female    DOB: Nov 02, 1930, 80 y.o.   MRN: [id number]  Patient here today for follow up of chronic medical problems.  Hypertension This is a chronic problem. The current episode started more than 1 year ago. The problem has been resolved since onset. The problem is controlled. Pertinent negatives include no blurred vision, chest pain, headaches, palpitations, peripheral edema or shortness of breath. There are no associated agents to hypertension. Risk factors for coronary artery disease include diabetes mellitus, dyslipidemia, obesity and post-menopausal state. Past treatments include ACE inhibitors and calcium channel blockers. Improvement on treatment: Monitors BP daily, usually runs 117/60. Compliance problems include exercise.   Hyperlipidemia This is a chronic problem. The current episode started more than 1 year ago. The problem is controlled. Recent lipid tests were reviewed and are normal. There are no known factors aggravating her hyperlipidemia. Pertinent negatives include no chest pain or shortness of breath. Current antihyperlipidemic treatment includes diet change (Has never taken Simvastatin, refuses to take it). The current treatment provides moderate improvement of lipids. Compliance problems include medication side effects.  Risk factors for coronary artery disease include diabetes mellitus, hypertension, obesity and post-menopausal.  Diabetes She presents for her follow-up diabetic visit. She has type 2 diabetes mellitus. No MedicAlert identification noted. The initial diagnosis of diabetes was made 11 years ago. Her disease course has been stable. There are no hypoglycemic associated symptoms. Pertinent negatives for hypoglycemia include no dizziness or headaches. Pertinent negatives for diabetes include no blurred vision, no chest pain, no polydipsia, no polyphagia, no polyuria and no visual change. There are no hypoglycemic complications.  Symptoms are stable. There are no diabetic complications. Risk factors for coronary artery disease include dyslipidemia, hypertension, obesity and post-menopausal. Current diabetic treatment includes diet. She is compliant with treatment all of the time. Her weight is stable. She is following a diabetic and generally healthy diet. When asked about meal planning, she reported none. She has not had a previous visit with a dietician. She rarely participates in exercise. There is no change in her home blood glucose trend. (Patient doesn;t check blood sugars at home) An ACE inhibitor/angiotensin II receptor blocker is being taken. She does not see a podiatrist.Eye exam is current.  hypokalemia K-Dur daily- No C/O lower ext cramping    Review of Systems  Constitutional: Negative.   Eyes: Negative for blurred vision.  Respiratory: Negative.  Negative for shortness of breath.   Cardiovascular: Negative for chest pain and palpitations.  Gastrointestinal: Negative for diarrhea and constipation.  Endocrine: Negative for polydipsia, polyphagia and polyuria.  Neurological: Negative for dizziness and headaches.  Psychiatric/Behavioral: Negative.  Negative for sleep disturbance.  All other systems reviewed and are negative.      Objective:   Physical Exam  Constitutional: She is oriented to person, place, and time. She appears well-developed and well-nourished.  HENT:  Nose: Nose normal.  Mouth/Throat: Oropharynx is clear and moist.  Eyes: EOM are normal.  Neck: Trachea normal, normal range of motion and full passive range of motion without pain. Neck supple. No JVD present. Carotid bruit is not present. No thyromegaly present.  Cardiovascular: Normal rate, normal heart sounds and intact distal pulses.  Exam reveals no gallop and no friction rub.   No murmur heard. Irregular heart beat   Pulmonary/Chest: Effort normal and breath sounds normal.  Abdominal: Soft. Bowel sounds are normal. She exhibits  no distension and no mass. There is no tenderness.  Musculoskeletal:  Normal range of motion.  Lymphadenopathy:    She has no cervical adenopathy.  Neurological: She is alert and oriented to person, place, and time. She has normal reflexes.  Skin: Skin is warm and dry.  Psychiatric: She has a normal mood and affect. Her behavior is normal. Judgment and thought content normal.   Results for orders placed in visit on 03/11/14  HM DEXA SCAN      Result Value Ref Range   HM Dexa Scan osteoporosis     Results for orders placed in visit on 03/20/14  POCT GLYCOSYLATED HEMOGLOBIN (HGB A1C)      Result Value Ref Range   Hemoglobin A1C 5.5    POCT UA - MICROALBUMIN      Result Value Ref Range   Microalbumin Ur, POC 20          Assessment & Plan:   1. Type 2 diabetes mellitus without complication Continue to count carbs - POCT glycosylated hemoglobin (Hb A1C) - POCT UA - Microalbumin - Microalbumin, urine  2. Hyperlipidemia with target LDL less than 100 Low fat diet - NMR, lipoprofile  3. Essential hypertension low NA+ in diet - CMP14+EGFR - amLODipine-benazepril (LOTREL) 5-10 MG per capsule; Take 1 capsule by mouth daily.  Dispense: 30 capsule; Refill: 5  4. Hypokalemia - potassium chloride SA (K-DUR,KLOR-CON) 20 MEQ tablet; TAKE TWO TABLETS BY MOUTH DAILY  Dispense: 60 tablet; Refill: 5  5. BMI 28.0-28.9,adult Discussed diet and exercise for person with BMI >25 Will recheck weight in 3-6 months   6. Osteoporosis with fracture, with routine healing, subsequent encounter Weight bearing exercises - raloxifene (EVISTA) 60 MG tablet; Take 1 tablet (60 mg total) by mouth daily.  Dispense: 30 tablet; Refill: 2   Labs pending Health maintenance reviewed Diet and exercise encouraged Continue all meds Follow up  In 3 months   [place], FNP

## 2014-03-20 NOTE — Patient Instructions (Signed)

## 2014-03-21 LAB — MICROALBUMIN, URINE

## 2014-03-21 LAB — CMP14+EGFR
ALK PHOS: 75 IU/L (ref 39–117)
ALT: 29 IU/L (ref 0–32)
AST: 50 IU/L — AB (ref 0–40)
Albumin/Globulin Ratio: 1.3 (ref 1.1–2.5)
Albumin: 4.3 g/dL (ref 3.5–4.7)
BUN / CREAT RATIO: 8 — AB (ref 11–26)
BUN: 5 mg/dL — AB (ref 8–27)
CHLORIDE: 101 mmol/L (ref 97–108)
CO2: 26 mmol/L (ref 18–29)
Calcium: 9.6 mg/dL (ref 8.7–10.3)
Creatinine, Ser: 0.65 mg/dL (ref 0.57–1.00)
GFR calc Af Amer: 96 mL/min/{1.73_m2} (ref >59)
GFR calc non Af Amer: 84 mL/min/{1.73_m2} (ref >59)
Globulin, Total: 3.4 g/dL (ref 1.5–4.5)
Glucose: 125 mg/dL — ABNORMAL HIGH (ref 65–99)
Potassium: 4 mmol/L (ref 3.5–5.2)
SODIUM: 144 mmol/L (ref 134–144)
Total Bilirubin: 0.4 mg/dL (ref 0.0–1.2)
Total Protein: 7.7 g/dL (ref 6.0–8.5)

## 2014-03-21 LAB — NMR, LIPOPROFILE
CHOLESTEROL: 117 mg/dL (ref 100–199)
HDL CHOLESTEROL BY NMR: 57 mg/dL (ref >39)
HDL Particle Number: 31.7 umol/L (ref >=30.5)
LDL PARTICLE NUMBER: 715 nmol/L (ref <1000)
LDL Size: 20 nm (ref >20.5)
LDLC SERPL CALC-MCNC: 44 mg/dL (ref 0–99)
LP-IR Score: <25 (ref <=45)
Small LDL Particle Number: 325 nmol/L (ref <=527)
TRIGLYCERIDES BY NMR: 79 mg/dL (ref 0–149)

## 2014-03-23 ENCOUNTER — Ambulatory Visit: Payer: Medicare Other | Attending: Family Medicine | Admitting: Physical Therapy

## 2014-03-23 ENCOUNTER — Telehealth: Payer: Self-pay | Admitting: Family Medicine

## 2014-03-23 DIAGNOSIS — M25579 Pain in unspecified ankle and joints of unspecified foot: Secondary | ICD-10-CM | POA: Insufficient documentation

## 2014-03-23 DIAGNOSIS — R5381 Other malaise: Secondary | ICD-10-CM | POA: Diagnosis not present

## 2014-03-23 DIAGNOSIS — IMO0001 Reserved for inherently not codable concepts without codable children: Secondary | ICD-10-CM | POA: Insufficient documentation

## 2014-03-23 DIAGNOSIS — M25676 Stiffness of unspecified foot, not elsewhere classified: Secondary | ICD-10-CM | POA: Diagnosis not present

## 2014-03-23 DIAGNOSIS — M25673 Stiffness of unspecified ankle, not elsewhere classified: Secondary | ICD-10-CM | POA: Insufficient documentation

## 2014-03-23 NOTE — Telephone Encounter (Signed)
copy mailed

## 2014-03-23 NOTE — Telephone Encounter (Signed)
Message copied by Azalee Course on Mon Mar 23, 2014 12:14 PM ------      Message from: Bennie Pierini      Created: Mon Mar 23, 2014 11:35 AM       microalbumin normal      Hgba1c discussed at appointment      Kidney and liver function stable      Blood sugar elevated- watch carbs      .cholesterol looks greta ------

## 2014-03-23 NOTE — Telephone Encounter (Signed)
Pt aware of lab results.  Please mail copy of labs to pt.  rs

## 2014-03-26 ENCOUNTER — Ambulatory Visit: Payer: Medicare Other | Attending: Family Medicine | Admitting: Physical Therapy

## 2014-03-26 DIAGNOSIS — Z5189 Encounter for other specified aftercare: Secondary | ICD-10-CM | POA: Insufficient documentation

## 2014-03-26 DIAGNOSIS — M25571 Pain in right ankle and joints of right foot: Secondary | ICD-10-CM | POA: Insufficient documentation

## 2014-03-26 DIAGNOSIS — R5381 Other malaise: Secondary | ICD-10-CM | POA: Insufficient documentation

## 2014-03-26 DIAGNOSIS — M25671 Stiffness of right ankle, not elsewhere classified: Secondary | ICD-10-CM | POA: Insufficient documentation

## 2014-03-30 ENCOUNTER — Ambulatory Visit: Payer: Medicare Other | Admitting: Physical Therapy

## 2014-03-30 DIAGNOSIS — Z5189 Encounter for other specified aftercare: Secondary | ICD-10-CM | POA: Diagnosis not present

## 2014-04-06 ENCOUNTER — Ambulatory Visit: Payer: Medicare Other | Admitting: Physical Therapy

## 2014-04-06 DIAGNOSIS — Z5189 Encounter for other specified aftercare: Secondary | ICD-10-CM | POA: Diagnosis not present

## 2014-04-07 ENCOUNTER — Ambulatory Visit: Payer: 59

## 2014-04-13 ENCOUNTER — Ambulatory Visit: Payer: Medicare Other | Admitting: Physical Therapy

## 2014-04-13 DIAGNOSIS — Z5189 Encounter for other specified aftercare: Secondary | ICD-10-CM | POA: Diagnosis not present

## 2014-06-24 ENCOUNTER — Ambulatory Visit (INDEPENDENT_AMBULATORY_CARE_PROVIDER_SITE_OTHER): Payer: 59 | Admitting: Nurse Practitioner

## 2014-06-24 ENCOUNTER — Encounter: Payer: Self-pay | Admitting: Nurse Practitioner

## 2014-06-24 ENCOUNTER — Encounter (INDEPENDENT_AMBULATORY_CARE_PROVIDER_SITE_OTHER): Payer: Self-pay

## 2014-06-24 VITALS — BP 133/62 | HR 71 | Temp 96.1°F | Ht 61.0 in | Wt 156.0 lb

## 2014-06-24 DIAGNOSIS — E785 Hyperlipidemia, unspecified: Secondary | ICD-10-CM

## 2014-06-24 DIAGNOSIS — E876 Hypokalemia: Secondary | ICD-10-CM

## 2014-06-24 DIAGNOSIS — Z6828 Body mass index (BMI) 28.0-28.9, adult: Secondary | ICD-10-CM

## 2014-06-24 DIAGNOSIS — E119 Type 2 diabetes mellitus without complications: Secondary | ICD-10-CM

## 2014-06-24 DIAGNOSIS — I10 Essential (primary) hypertension: Secondary | ICD-10-CM

## 2014-06-24 DIAGNOSIS — M8080XD Other osteoporosis with current pathological fracture, unspecified site, subsequent encounter for fracture with routine healing: Secondary | ICD-10-CM

## 2014-06-24 LAB — POCT GLYCOSYLATED HEMOGLOBIN (HGB A1C)

## 2014-06-24 MED ORDER — POTASSIUM CHLORIDE CRYS ER 20 MEQ PO TBCR: EXTENDED_RELEASE_TABLET | ORAL | Status: DC

## 2014-06-24 MED ORDER — AMLODIPINE BESY-BENAZEPRIL HCL 5-10 MG PO CAPS: 1.0000 | ORAL_CAPSULE | Freq: Every day | ORAL | Status: DC

## 2014-06-24 MED ORDER — RALOXIFENE HCL 60 MG PO TABS: 60.0000 mg | ORAL_TABLET | Freq: Every day | ORAL | Status: DC

## 2014-06-24 NOTE — Patient Instructions (Signed)

## 2014-06-24 NOTE — Progress Notes (Signed)
Subjective:    Patient ID: Christina Delgado, female    DOB: 07-07-1930, 80 y.o.   MRN: [id number]  Patient here today for follow up of chronic medical problems.  Hypertension This is a chronic problem. The current episode started more than 1 year ago. The problem is controlled. Pertinent negatives include no chest pain, headaches, palpitations or shortness of breath. Risk factors for coronary artery disease include dyslipidemia, diabetes mellitus, family history, obesity and post-menopausal state. Past treatments include ACE inhibitors and calcium channel blockers. The current treatment provides moderate improvement.  Hyperlipidemia This is a chronic problem. The current episode started more than 1 year ago. The problem is controlled. Recent lipid tests were reviewed and are normal. Exacerbating diseases include diabetes and obesity. She has no history of hypothyroidism. Pertinent negatives include no chest pain or shortness of breath. She is currently on no antihyperlipidemic treatment. The current treatment provides moderate improvement of lipids. Risk factors for coronary artery disease include diabetes mellitus, dyslipidemia, hypertension, obesity and post-menopausal.  Diabetes She presents for her follow-up diabetic visit. She has type 2 diabetes mellitus. No MedicAlert identification noted. Pertinent negatives for hypoglycemia include no dizziness or headaches. Pertinent negatives for diabetes include no chest pain, no polydipsia, no polyphagia, no polyuria and no visual change. Risk factors for coronary artery disease include diabetes mellitus, dyslipidemia, obesity, hypertension and post-menopausal. Current diabetic treatment includes diet. When asked about meal planning, she reported none. She has not had a previous visit with a dietitian. She rarely participates in exercise. Her breakfast blood glucose is taken between 9-10 am. Her breakfast blood glucose range is generally 130-140 mg/dl. Her  overall blood glucose range is 130-140 mg/dl. An ACE inhibitor/angiotensin II receptor blocker is being taken. She does not see a podiatrist.Eye exam is current.  hypokalemia K-Dur daily- No C/O lower ext cramping    Review of Systems  Constitutional: Negative.   Respiratory: Negative.  Negative for shortness of breath.   Cardiovascular: Negative for chest pain and palpitations.  Gastrointestinal: Negative for diarrhea and constipation.  Endocrine: Negative for polydipsia, polyphagia and polyuria.  Neurological: Negative for dizziness and headaches.  Psychiatric/Behavioral: Negative.  Negative for sleep disturbance.  All other systems reviewed and are negative.      Objective:   Physical Exam  Constitutional: She is oriented to person, place, and time. She appears well-developed and well-nourished.  HENT:  Nose: Nose normal.  Mouth/Throat: Oropharynx is clear and moist.  Eyes: EOM are normal.  Neck: Trachea normal, normal range of motion and full passive range of motion without pain. Neck supple. No JVD present. Carotid bruit is not present. No thyromegaly present.  Cardiovascular: Normal rate, regular rhythm, normal heart sounds and intact distal pulses.  Exam reveals no gallop and no friction rub.   No murmur heard. Pulmonary/Chest: Effort normal and breath sounds normal.  Abdominal: Soft. Bowel sounds are normal. She exhibits no distension and no mass. There is no tenderness.  Musculoskeletal: Normal range of motion.  Lymphadenopathy:    She has no cervical adenopathy.  Neurological: She is alert and oriented to person, place, and time. She has normal reflexes.  Skin: Skin is warm and dry.  Psychiatric: She has a normal mood and affect. Her behavior is normal. Judgment and thought content normal.   Results for orders placed or performed in visit on 06/24/14  POCT glycosylated hemoglobin (Hb A1C)  Result Value Ref Range   Hemoglobin A1C 5.5%    BP 133/62 mmHg  Pulse 71  Temp(Src) 96.1 F (35.6 C) (Oral)  Ht '5\' 1"'  (1.549 m)  Wt 156 lb (70.761 kg)  BMI 29.49 kg/m2       Assessment & Plan:   1. Type 2 diabetes mellitus without complication Carb counting encouraged - POCT glycosylated hemoglobin (Hb A1C)  2. Hyperlipidemia with target LDL less than 100 Low fat diet - NMR, lipoprofile  3. Essential hypertension Do not add salt to diet - CMP14+EGFR - amLODipine-benazepril (LOTREL) 5-10 MG per capsule; Take 1 capsule by mouth daily.  Dispense: 30 capsule; Refill: 5  4. Hypokalemia - potassium chloride SA (K-DUR,KLOR-CON) 20 MEQ tablet; TAKE TWO TABLETS BY MOUTH DAILY  Dispense: 60 tablet; Refill: 5  5. BMI 28.0-28.9,adult Discussed diet and exercise for person with BMI >25 Will recheck weight in 3-6 months   6. Osteoporosis with fracture, with routine healing, subsequent encounter Weight bearing exercises - raloxifene (EVISTA) 60 MG tablet; Take 1 tablet (60 mg total) by mouth daily.  Dispense: 30 tablet; Refill: 2    Labs pending Health maintenance reviewed Diet and exercise encouraged Continue all meds Follow up  In 3 months   [place], FNP

## 2014-06-25 LAB — NMR, LIPOPROFILE
Cholesterol: 128 mg/dL (ref 100–199)
HDL Cholesterol by NMR: 49 mg/dL (ref >39)
HDL PARTICLE NUMBER: 32.4 umol/L (ref >=30.5)
LDL Particle Number: 1452 nmol/L — ABNORMAL HIGH (ref <1000)
LDL SIZE: 20.6 nm (ref >20.5)
LDL-C: 64 mg/dL (ref 0–99)
LP-IR Score: 27 (ref <=45)
SMALL LDL PARTICLE NUMBER: 946 nmol/L — AB (ref <=527)
Triglycerides by NMR: 74 mg/dL (ref 0–149)

## 2014-06-25 LAB — CMP14+EGFR
ALK PHOS: 75 IU/L (ref 39–117)
ALT: 23 IU/L (ref 0–32)
AST: 42 IU/L — AB (ref 0–40)
Albumin/Globulin Ratio: 1.1 (ref 1.1–2.5)
Albumin: 3.9 g/dL (ref 3.5–4.7)
BILIRUBIN TOTAL: 0.5 mg/dL (ref 0.0–1.2)
BUN / CREAT RATIO: 6 — AB (ref 11–26)
BUN: 4 mg/dL — AB (ref 8–27)
CO2: 24 mmol/L (ref 18–29)
Calcium: 9.1 mg/dL (ref 8.7–10.3)
Chloride: 99 mmol/L (ref 97–108)
Creatinine, Ser: 0.63 mg/dL (ref 0.57–1.00)
GFR calc non Af Amer: 84 mL/min/{1.73_m2} (ref >59)
GFR, EST AFRICAN AMERICAN: 97 mL/min/{1.73_m2} (ref >59)
Globulin, Total: 3.4 g/dL (ref 1.5–4.5)
Glucose: 110 mg/dL — ABNORMAL HIGH (ref 65–99)
POTASSIUM: 3.8 mmol/L (ref 3.5–5.2)
SODIUM: 140 mmol/L (ref 134–144)
TOTAL PROTEIN: 7.3 g/dL (ref 6.0–8.5)

## 2014-09-24 ENCOUNTER — Encounter: Payer: Self-pay | Admitting: Nurse Practitioner

## 2014-09-24 ENCOUNTER — Telehealth: Payer: Self-pay | Admitting: Nurse Practitioner

## 2014-09-24 ENCOUNTER — Ambulatory Visit (INDEPENDENT_AMBULATORY_CARE_PROVIDER_SITE_OTHER): Payer: Medicare Other | Admitting: Nurse Practitioner

## 2014-09-24 VITALS — BP 131/68 | HR 77 | Temp 97.3°F | Ht 61.0 in | Wt 156.0 lb

## 2014-09-24 DIAGNOSIS — I10 Essential (primary) hypertension: Secondary | ICD-10-CM

## 2014-09-24 DIAGNOSIS — M8080XD Other osteoporosis with current pathological fracture, unspecified site, subsequent encounter for fracture with routine healing: Secondary | ICD-10-CM | POA: Diagnosis not present

## 2014-09-24 DIAGNOSIS — E785 Hyperlipidemia, unspecified: Secondary | ICD-10-CM | POA: Diagnosis not present

## 2014-09-24 DIAGNOSIS — E119 Type 2 diabetes mellitus without complications: Secondary | ICD-10-CM

## 2014-09-24 LAB — POCT GLYCOSYLATED HEMOGLOBIN (HGB A1C): Hemoglobin A1C: 5.4

## 2014-09-24 NOTE — Patient Instructions (Signed)

## 2014-09-24 NOTE — Progress Notes (Signed)
Subjective:    Patient ID: Christina Delgado, female    DOB: 09/08/1930, 81 y.o.   MRN: [id number]  Patient here today for follow up of chronic medical problems.  Hypertension This is a chronic problem. The current episode started more than 1 year ago. The problem is controlled. Pertinent negatives include no chest pain, headaches, palpitations or shortness of breath. Risk factors for coronary artery disease include dyslipidemia, diabetes mellitus, family history, obesity and post-menopausal state. Past treatments include ACE inhibitors and calcium channel blockers. The current treatment provides moderate improvement.  Hyperlipidemia This is a chronic problem. The current episode started more than 1 year ago. The problem is controlled. Recent lipid tests were reviewed and are normal. Exacerbating diseases include diabetes and obesity. She has no history of hypothyroidism. Pertinent negatives include no chest pain or shortness of breath. She is currently on no antihyperlipidemic treatment (makes her hurt and is to expensive.). The current treatment provides moderate improvement of lipids. Risk factors for coronary artery disease include diabetes mellitus, dyslipidemia, hypertension, obesity and post-menopausal.  Diabetes She presents for her follow-up diabetic visit. She has type 2 diabetes mellitus. No MedicAlert identification noted. Pertinent negatives for hypoglycemia include no dizziness or headaches. Pertinent negatives for diabetes include no chest pain, no polydipsia, no polyphagia, no polyuria and no visual change. Risk factors for coronary artery disease include diabetes mellitus, dyslipidemia, obesity, hypertension and post-menopausal. Current diabetic treatment includes diet. When asked about meal planning, she reported none. She has not had a previous visit with a dietitian. She rarely participates in exercise. Her breakfast blood glucose is taken between 9-10 am. Her breakfast blood glucose  range is generally 130-140 mg/dl. Her overall blood glucose range is 130-140 mg/dl. An ACE inhibitor/angiotensin II receptor blocker is being taken. She does not see a podiatrist.Eye exam is current.  hypokalemia K-Dur daily- No C/O lower ext cramping    Review of Systems  Constitutional: Negative.   Respiratory: Negative.  Negative for shortness of breath.   Cardiovascular: Negative for chest pain and palpitations.  Gastrointestinal: Negative for diarrhea and constipation.  Endocrine: Negative for polydipsia, polyphagia and polyuria.  Neurological: Negative for dizziness and headaches.  Psychiatric/Behavioral: Negative.  Negative for sleep disturbance.  All other systems reviewed and are negative.      Objective:   Physical Exam  Constitutional: She is oriented to person, place, and time. She appears well-developed and well-nourished.  HENT:  Nose: Nose normal.  Mouth/Throat: Oropharynx is clear and moist.  Eyes: EOM are normal.  Neck: Trachea normal, normal range of motion and full passive range of motion without pain. Neck supple. No JVD present. Carotid bruit is not present. No thyromegaly present.  Cardiovascular: Normal rate, regular rhythm, normal heart sounds and intact distal pulses.  Exam reveals no gallop and no friction rub.   No murmur heard. Pulmonary/Chest: Effort normal and breath sounds normal.  Abdominal: Soft. Bowel sounds are normal. She exhibits no distension and no mass. There is no tenderness.  Musculoskeletal: Normal range of motion.  Lymphadenopathy:    She has no cervical adenopathy.  Neurological: She is alert and oriented to person, place, and time. She has normal reflexes.  Skin: Skin is warm and dry.  Psychiatric: She has a normal mood and affect. Her behavior is normal. Judgment and thought content normal.    BP 131/68 mmHg  Pulse 77  Temp(Src) 97.3 F (36.3 C) (Oral)  Ht _0  (1.549 m)  Wt 156 lb (70.761 kg)  BMI  29.49 kg/m2   Results  for orders placed or performed in visit on 09/24/14  POCT glycosylated hemoglobin (Hb A1C)  Result Value Ref Range   Hemoglobin A1C 5.4%         Assessment & Plan:   1. Type 2 diabetes mellitus without complication Carb counting encouraged - POCT glycosylated hemoglobin (Hb A1C)  2. Hyperlipidemia with target LDL less than 100 Low fat diet - NMR, lipoprofile  3. Essential hypertension Do not add salt to diet - CMP14+EGFR - amLODipine-benazepril (LOTREL) 5-10 MG per capsule; Take 1 capsule by mouth daily.  Dispense: 30 capsule; Refill: 5  4. Hypokalemia - potassium chloride SA (K-DUR,KLOR-CON) 20 MEQ tablet; TAKE TWO TABLETS BY MOUTH DAILY  Dispense: 60 tablet; Refill: 5  5. BMI 28.0-28.9,adult Discussed diet and exercise for person with BMI >25 Will recheck weight in 3-6 months   6. Osteoporosis with fracture, with routine healing, subsequent encounter Weight bearing exercises - raloxifene (EVISTA) 60 MG tablet; Take 1 tablet (60 mg total) by mouth daily.  Dispense: 30 tablet; Refill: 2    Labs pending Health maintenance reviewed Diet and exercise encouraged Continue all meds Follow up  In 3 months   [place], FNP

## 2014-09-25 LAB — NMR, LIPOPROFILE
CHOLESTEROL: 130 mg/dL (ref 100–199)
HDL Cholesterol by NMR: 62 mg/dL (ref >39)
HDL Particle Number: 35.3 umol/L (ref >=30.5)
LDL Particle Number: 811 nmol/L (ref <1000)
LDL Size: 20.8 nm (ref >20.5)
LDL-C: 53 mg/dL (ref 0–99)
LP-IR Score: <25 (ref <=45)
TRIGLYCERIDES BY NMR: 74 mg/dL (ref 0–149)

## 2014-09-25 LAB — CMP14+EGFR
A/G RATIO: 1.2 (ref 1.1–2.5)
ALK PHOS: 70 IU/L (ref 39–117)
ALT: 25 IU/L (ref 0–32)
AST: 39 IU/L (ref 0–40)
Albumin: 3.8 g/dL (ref 3.5–4.7)
BUN/Creatinine Ratio: 5 — ABNORMAL LOW (ref 11–26)
BUN: 3 mg/dL — ABNORMAL LOW (ref 8–27)
Bilirubin Total: 0.6 mg/dL (ref 0.0–1.2)
CALCIUM: 9.3 mg/dL (ref 8.7–10.3)
CO2: 25 mmol/L (ref 18–29)
Chloride: 100 mmol/L (ref 97–108)
Creatinine, Ser: 0.64 mg/dL (ref 0.57–1.00)
GFR calc Af Amer: 97 mL/min/{1.73_m2} (ref >59)
GFR calc non Af Amer: 84 mL/min/{1.73_m2} (ref >59)
GLUCOSE: 103 mg/dL — AB (ref 65–99)
Globulin, Total: 3.3 g/dL (ref 1.5–4.5)
POTASSIUM: 4.1 mmol/L (ref 3.5–5.2)
Sodium: 142 mmol/L (ref 134–144)
Total Protein: 7.1 g/dL (ref 6.0–8.5)

## 2014-09-28 ENCOUNTER — Encounter: Payer: Self-pay | Admitting: Nurse Practitioner

## 2015-01-01 ENCOUNTER — Encounter: Payer: Self-pay | Admitting: Nurse Practitioner

## 2015-01-01 ENCOUNTER — Ambulatory Visit (INDEPENDENT_AMBULATORY_CARE_PROVIDER_SITE_OTHER): Payer: Medicare Other | Admitting: Nurse Practitioner

## 2015-01-01 ENCOUNTER — Encounter (INDEPENDENT_AMBULATORY_CARE_PROVIDER_SITE_OTHER): Payer: Self-pay

## 2015-01-01 VITALS — BP 130/68 | HR 82 | Temp 96.9°F | Ht 61.0 in | Wt 153.0 lb

## 2015-01-01 DIAGNOSIS — E876 Hypokalemia: Secondary | ICD-10-CM | POA: Diagnosis not present

## 2015-01-01 DIAGNOSIS — E785 Hyperlipidemia, unspecified: Secondary | ICD-10-CM

## 2015-01-01 DIAGNOSIS — M8080XD Other osteoporosis with current pathological fracture, unspecified site, subsequent encounter for fracture with routine healing: Secondary | ICD-10-CM

## 2015-01-01 DIAGNOSIS — I10 Essential (primary) hypertension: Secondary | ICD-10-CM | POA: Diagnosis not present

## 2015-01-01 DIAGNOSIS — Z6828 Body mass index (BMI) 28.0-28.9, adult: Secondary | ICD-10-CM

## 2015-01-01 DIAGNOSIS — Z23 Encounter for immunization: Secondary | ICD-10-CM | POA: Diagnosis not present

## 2015-01-01 DIAGNOSIS — E119 Type 2 diabetes mellitus without complications: Secondary | ICD-10-CM | POA: Diagnosis not present

## 2015-01-01 LAB — POCT GLYCOSYLATED HEMOGLOBIN (HGB A1C): Hemoglobin A1C: 5.3

## 2015-01-01 MED ORDER — POTASSIUM CHLORIDE CRYS ER 20 MEQ PO TBCR: EXTENDED_RELEASE_TABLET | ORAL | Status: DC

## 2015-01-01 MED ORDER — AMLODIPINE BESY-BENAZEPRIL HCL 5-10 MG PO CAPS: 1.0000 | ORAL_CAPSULE | Freq: Every day | ORAL | Status: DC

## 2015-01-01 NOTE — Addendum Note (Signed)
Addended by: Cleda DaubUCKER, Megen Madewell G on: 01/01/2015 11:25 AM   Modules accepted: Orders

## 2015-01-01 NOTE — Patient Instructions (Signed)

## 2015-01-01 NOTE — Addendum Note (Signed)
Addended by: Bennie PieriniMARTIN, MARY-MARGARET on: 01/01/2015 11:07 AM   Modules accepted: Level of Service

## 2015-01-01 NOTE — Progress Notes (Signed)
Subjective:    Patient ID: Christina Delgado, female    DOB: Aug 03, 1930, 81 y.o.   MRN: [id number]  Patient here today for follow up of chronic medical problems.  Hypertension This is a chronic problem. The current episode started more than 1 year ago. The problem is controlled. Pertinent negatives include no chest pain, headaches, palpitations or shortness of breath. Risk factors for coronary artery disease include dyslipidemia, diabetes mellitus, family history, obesity and post-menopausal state. Past treatments include ACE inhibitors and calcium channel blockers. The current treatment provides moderate improvement.  Hyperlipidemia This is a chronic problem. The current episode started more than 1 year ago. The problem is controlled. Recent lipid tests were reviewed and are normal. Exacerbating diseases include diabetes and obesity. She has no history of hypothyroidism. Pertinent negatives include no chest pain or shortness of breath. She is currently on no antihyperlipidemic treatment (makes her hurt and is to expensive.). The current treatment provides moderate improvement of lipids. Risk factors for coronary artery disease include diabetes mellitus, dyslipidemia, hypertension, obesity and post-menopausal.  Diabetes She presents for her follow-up diabetic visit. She has type 2 diabetes mellitus. No MedicAlert identification noted. Pertinent negatives for hypoglycemia include no dizziness or headaches. Pertinent negatives for diabetes include no chest pain, no polydipsia, no polyphagia, no polyuria and no visual change. Risk factors for coronary artery disease include diabetes mellitus, dyslipidemia, obesity, hypertension and post-menopausal. Current diabetic treatment includes diet. When asked about meal planning, she reported none. She has not had a previous visit with a dietitian. She rarely participates in exercise. Her breakfast blood glucose is taken between 9-10 am. Her breakfast blood glucose  range is generally 130-140 mg/dl. Her overall blood glucose range is 130-140 mg/dl. An ACE inhibitor/angiotensin II receptor blocker is being taken. She does not see a podiatrist.Eye exam is current.  hypokalemia K-Dur daily- No C/O lower ext cramping    Review of Systems  Constitutional: Negative.   Respiratory: Negative.  Negative for shortness of breath.   Cardiovascular: Negative for chest pain and palpitations.  Gastrointestinal: Negative for diarrhea and constipation.  Endocrine: Negative for polydipsia, polyphagia and polyuria.  Neurological: Negative for dizziness and headaches.  Psychiatric/Behavioral: Negative.  Negative for sleep disturbance.  All other systems reviewed and are negative.      Objective:   Physical Exam  Constitutional: She is oriented to person, place, and time. She appears well-developed and well-nourished.  HENT:  Nose: Nose normal.  Mouth/Throat: Oropharynx is clear and moist.  Eyes: EOM are normal.  Neck: Trachea normal, normal range of motion and full passive range of motion without pain. Neck supple. No JVD present. Carotid bruit is not present. No thyromegaly present.  Cardiovascular: Normal rate, regular rhythm, normal heart sounds and intact distal pulses.  Exam reveals no gallop and no friction rub.   No murmur heard. Pulmonary/Chest: Effort normal and breath sounds normal.  Abdominal: Soft. Bowel sounds are normal. She exhibits no distension and no mass. There is no tenderness.  Musculoskeletal: Normal range of motion.  Lymphadenopathy:    She has no cervical adenopathy.  Neurological: She is alert and oriented to person, place, and time. She has normal reflexes.  Skin: Skin is warm and dry.  Psychiatric: She has a normal mood and affect. Her behavior is normal. Judgment and thought content normal.    BP 130/68 mmHg  Pulse 82  Temp(Src) 96.9 F (36.1 C) (Oral)  Ht 5' 1" (1.549 m)  Wt 153 lb (69.4 kg)  BMI  28.92 kg/m2          Assessment & Plan:   1. Essential hypertension Do not add salt to diet - amLODipine-benazepril (LOTREL) 5-10 MG per capsule; Take 1 capsule by mouth daily.  Dispense: 30 capsule; Refill: 5 - CMP14+EGFR  2. Type 2 diabetes mellitus without complication Continue  To watch carbs on diet - POCT glycosylated hemoglobin (Hb A1C)  3. Hypokalemia - potassium chloride SA (K-DUR,KLOR-CON) 20 MEQ tablet; TAKE TWO TABLETS BY MOUTH DAILY  Dispense: 60 tablet; Refill: 5  4. Hyperlipidemia with target LDL less than 100 Low  fat diet - Lipid panel  5. BMI 28.0-28.9,adult Discussed diet and exercise for person with BMI >25 Will recheck weight in 3-6 months   6. Osteoporosis with fracture, with routine healing, subsequent encounter Weight bearing exericses    Labs pending Health maintenance reviewed Diet and exercise encouraged Continue all meds Follow up  In 3 month   [place], FNP

## 2015-01-02 LAB — CMP14+EGFR
ALT: 24 IU/L (ref 0–32)
AST: 39 IU/L (ref 0–40)
Albumin/Globulin Ratio: 1.1 (ref 1.1–2.5)
Albumin: 3.6 g/dL (ref 3.5–4.7)
Alkaline Phosphatase: 69 IU/L (ref 39–117)
BUN/Creatinine Ratio: 6 — ABNORMAL LOW (ref 11–26)
BUN: 4 mg/dL — ABNORMAL LOW (ref 8–27)
Bilirubin Total: 0.5 mg/dL (ref 0.0–1.2)
CALCIUM: 9.5 mg/dL (ref 8.7–10.3)
CO2: 25 mmol/L (ref 18–29)
CREATININE: 0.63 mg/dL (ref 0.57–1.00)
Chloride: 102 mmol/L (ref 97–108)
GFR calc Af Amer: 97 mL/min/{1.73_m2} (ref >59)
GFR calc non Af Amer: 84 mL/min/{1.73_m2} (ref >59)
Globulin, Total: 3.2 g/dL (ref 1.5–4.5)
Glucose: 98 mg/dL (ref 65–99)
POTASSIUM: 4.7 mmol/L (ref 3.5–5.2)
SODIUM: 142 mmol/L (ref 134–144)
Total Protein: 6.8 g/dL (ref 6.0–8.5)

## 2015-01-02 LAB — LIPID PANEL
Chol/HDL Ratio: 1.9 ratio units (ref 0.0–4.4)
Cholesterol, Total: 124 mg/dL (ref 100–199)
HDL: 64 mg/dL (ref >39)
LDL CALC: 45 mg/dL (ref 0–99)
Triglycerides: 74 mg/dL (ref 0–149)
VLDL CHOLESTEROL CAL: 15 mg/dL (ref 5–40)

## 2015-01-04 ENCOUNTER — Telehealth: Payer: Self-pay | Admitting: Nurse Practitioner

## 2015-01-04 NOTE — Telephone Encounter (Signed)
Informed pt that she received another Pneumonia shot d/t the fact that the new one covers more strains than the previous shot does.

## 2015-04-07 ENCOUNTER — Ambulatory Visit: Payer: Medicare Other | Admitting: Nurse Practitioner

## 2015-04-26 ENCOUNTER — Ambulatory Visit (INDEPENDENT_AMBULATORY_CARE_PROVIDER_SITE_OTHER): Payer: Medicare Other

## 2015-04-26 DIAGNOSIS — Z23 Encounter for immunization: Secondary | ICD-10-CM | POA: Diagnosis not present

## 2015-05-27 ENCOUNTER — Encounter: Payer: Self-pay | Admitting: Nurse Practitioner

## 2015-05-27 ENCOUNTER — Ambulatory Visit (INDEPENDENT_AMBULATORY_CARE_PROVIDER_SITE_OTHER): Payer: Medicare Other | Admitting: Nurse Practitioner

## 2015-05-27 VITALS — BP 135/69 | HR 65 | Temp 97.2°F | Ht 61.0 in | Wt 150.0 lb

## 2015-05-27 DIAGNOSIS — I10 Essential (primary) hypertension: Secondary | ICD-10-CM | POA: Diagnosis not present

## 2015-05-27 DIAGNOSIS — Z6828 Body mass index (BMI) 28.0-28.9, adult: Secondary | ICD-10-CM

## 2015-05-27 DIAGNOSIS — M8080XD Other osteoporosis with current pathological fracture, unspecified site, subsequent encounter for fracture with routine healing: Secondary | ICD-10-CM

## 2015-05-27 DIAGNOSIS — E119 Type 2 diabetes mellitus without complications: Secondary | ICD-10-CM | POA: Diagnosis not present

## 2015-05-27 DIAGNOSIS — M47896 Other spondylosis, lumbar region: Secondary | ICD-10-CM | POA: Diagnosis not present

## 2015-05-27 DIAGNOSIS — E785 Hyperlipidemia, unspecified: Secondary | ICD-10-CM | POA: Diagnosis not present

## 2015-05-27 DIAGNOSIS — E876 Hypokalemia: Secondary | ICD-10-CM

## 2015-05-27 LAB — POCT GLYCOSYLATED HEMOGLOBIN (HGB A1C): Hemoglobin A1C: 5.5

## 2015-05-27 MED ORDER — AMLODIPINE BESY-BENAZEPRIL HCL 5-10 MG PO CAPS: 1.0000 | ORAL_CAPSULE | Freq: Every day | ORAL | Status: DC

## 2015-05-27 MED ORDER — POTASSIUM CHLORIDE CRYS ER 20 MEQ PO TBCR: EXTENDED_RELEASE_TABLET | ORAL | Status: DC

## 2015-05-27 MED ORDER — ASPIRIN EC 81 MG PO TBEC: 81.0000 mg | DELAYED_RELEASE_TABLET | Freq: Every day | ORAL | Status: DC

## 2015-05-27 NOTE — Progress Notes (Signed)
Subjective:    Patient ID: Christina Delgado, female    DOB: 12-03-1930, 81 y.o.   MRN: [id number]  Patient here today for follow up of chronic medical problems. She lost her husband at end of October this year and is a little down.  Hypertension This is a chronic problem. The current episode started more than 1 year ago. The problem is controlled. Pertinent negatives include no chest pain, headaches, palpitations or shortness of breath. Risk factors for coronary artery disease include dyslipidemia, diabetes mellitus, family history, obesity and post-menopausal state. Past treatments include ACE inhibitors and calcium channel blockers. The current treatment provides moderate improvement.  Hyperlipidemia This is a chronic problem. The current episode started more than 1 year ago. The problem is controlled. Recent lipid tests were reviewed and are normal. Exacerbating diseases include diabetes and obesity. She has no history of hypothyroidism. Pertinent negatives include no chest pain or shortness of breath. She is currently on no antihyperlipidemic treatment (makes her hurt and is to expensive.). The current treatment provides moderate improvement of lipids. Risk factors for coronary artery disease include diabetes mellitus, dyslipidemia, hypertension, obesity and post-menopausal.  Diabetes She presents for her follow-up diabetic visit. She has type 2 diabetes mellitus. No MedicAlert identification noted. Pertinent negatives for hypoglycemia include no dizziness or headaches. Pertinent negatives for diabetes include no chest pain, no polydipsia, no polyphagia, no polyuria and no visual change. Risk factors for coronary artery disease include diabetes mellitus, dyslipidemia, obesity, hypertension and post-menopausal. Current diabetic treatment includes diet. When asked about meal planning, she reported none. She has not had a previous visit with a dietitian. She rarely participates in exercise. Her  breakfast blood glucose is taken between 9-10 am. Her breakfast blood glucose range is generally 130-140 mg/dl. Her overall blood glucose range is 130-140 mg/dl. An ACE inhibitor/angiotensin II receptor blocker is being taken. She does not see a podiatrist.Eye exam is current.  hypokalemia K-Dur daily- No C/O lower ext cramping DJD/osteoporosis Currently not taking anything. No c/o back pain today- says only has occasionally.  Review of Systems  Constitutional: Negative.   Respiratory: Negative.  Negative for shortness of breath.   Cardiovascular: Negative for chest pain and palpitations.  Gastrointestinal: Negative for diarrhea and constipation.  Endocrine: Negative for polydipsia, polyphagia and polyuria.  Neurological: Negative for dizziness and headaches.  Psychiatric/Behavioral: Negative.  Negative for sleep disturbance.  All other systems reviewed and are negative.      Objective:   Physical Exam  Constitutional: She is oriented to person, place, and time. She appears well-developed and well-nourished.  HENT:  Nose: Nose normal.  Mouth/Throat: Oropharynx is clear and moist.  Eyes: EOM are normal.  Neck: Trachea normal, normal range of motion and full passive range of motion without pain. Neck supple. No JVD present. Carotid bruit is not present. No thyromegaly present.  Cardiovascular: Normal rate, regular rhythm, normal heart sounds and intact distal pulses.  Exam reveals no gallop and no friction rub.   No murmur heard. Pulmonary/Chest: Effort normal and breath sounds normal.  Abdominal: Soft. Bowel sounds are normal. She exhibits no distension and no mass. There is no tenderness.  Musculoskeletal: Normal range of motion.  Lymphadenopathy:    She has no cervical adenopathy.  Neurological: She is alert and oriented to person, place, and time. She has normal reflexes.  Skin: Skin is warm and dry.  Psychiatric: She has a normal mood and affect. Her behavior is normal. Judgment  and thought content normal.  BP 135/69 mmHg  Pulse 65  Temp(Src) 97.2 F (36.2 C) (Oral)  Ht '5\' 1"'  (1.549 m)  Wt 150 lb (68.04 kg)  BMI 28.36 kg/m2  Results for orders placed or performed in visit on 05/27/15  POCT glycosylated hemoglobin (Hb A1C)  Result Value Ref Range   Hemoglobin A1C 5.5        Assessment & Plan:  1. Essential hypertension Do not add salt to diet - CMP14+EGFR - amLODipine-benazepril (LOTREL) 5-10 MG capsule; Take 1 capsule by mouth daily.  Dispense: 30 capsule; Refill: 5  2. Type 2 diabetes mellitus without complication, without long-term current use of insulin ([name]) Watch carbs in diet - POCT glycosylated hemoglobin (Hb A1C) - Microalbumin / creatinine urine ratio  3. Hyperlipidemia with target LDL less than 100 Low fat diet - Lipid panel  4. Other osteoarthritis of spine, lumbar region  5. Osteoporosis with fracture, with routine healing, subsequent encounter  6. Hypokalemia - potassium chloride SA (K-DUR,KLOR-CON) 20 MEQ tablet; TAKE TWO TABLETS BY MOUTH DAILY  Dispense: 60 tablet; Refill: 5  7. BMI 28.0-28.9,adult Discussed diet and exercise for person with BMI >25 Will recheck weight in 3-6 months    Add baby ASA daily to meds Labs pending Health maintenance reviewed Diet and exercise encouraged Continue all meds Follow up  In 3 months   [place], FNP

## 2015-05-27 NOTE — Patient Instructions (Signed)
Diabetes and Foot Care Diabetes may cause you to have problems because of poor blood supply (circulation) to your feet and legs. This may cause the skin on your feet to become thinner, break easier, and heal more slowly. Your skin may become dry, and the skin may peel and crack. You may also have nerve damage in your legs and feet causing decreased feeling in them. You may not notice minor injuries to your feet that could lead to infections or more serious problems. Taking care of your feet is one of the most important things you can do for yourself.  HOME CARE INSTRUCTIONS  Wear shoes at all times, even in the house. Do not go barefoot. Bare feet are easily injured.  Check your feet daily for blisters, cuts, and redness. If you cannot see the bottom of your feet, use a mirror or ask someone for help.  Wash your feet with warm water (do not use hot water) and mild soap. Then pat your feet and the areas between your toes until they are completely dry. Do not soak your feet as this can dry your skin.  Apply a moisturizing lotion or petroleum jelly (that does not contain alcohol and is unscented) to the skin on your feet and to dry, brittle toenails. Do not apply lotion between your toes.  Trim your toenails straight across. Do not dig under them or around the cuticle. File the edges of your nails with an emery board or nail file.  Do not cut corns or calluses or try to remove them with medicine.  Wear clean socks or stockings every day. Make sure they are not too tight. Do not wear knee-high stockings since they may decrease blood flow to your legs.  Wear shoes that fit properly and have enough cushioning. To break in new shoes, wear them for just a few hours a day. This prevents you from injuring your feet. Always look in your shoes before you put them on to be sure there are no objects inside.  Do not cross your legs. This may decrease the blood flow to your feet.  If you find a minor scrape,  cut, or break in the skin on your feet, keep it and the skin around it clean and dry. These areas may be cleansed with mild soap and water. Do not cleanse the area with peroxide, alcohol, or iodine.  When you remove an adhesive bandage, be sure not to damage the skin around it.  If you have a wound, look at it several times a day to make sure it is healing.  Do not use heating pads or hot water bottles. They may burn your skin. If you have lost feeling in your feet or legs, you may not know it is happening until it is too late.  Make sure your health care provider performs a complete foot exam at least annually or more often if you have foot problems. Report any cuts, sores, or bruises to your health care provider immediately. SEEK MEDICAL CARE IF:   You have an injury that is not healing.  You have cuts or breaks in the skin.  You have an ingrown nail.  You notice redness on your legs or feet.  You feel burning or tingling in your legs or feet.  You have pain or cramps in your legs and feet.  Your legs or feet are numb.  Your feet always feel cold. SEEK IMMEDIATE MEDICAL CARE IF:   There is increasing redness,   swelling, or pain in or around a wound.  There is a red line that goes up your leg.  Pus is coming from a wound.  You develop a fever or as directed by your health care provider.  You notice a bad smell coming from an ulcer or wound.   This information is not intended to replace advice given to you by your health care provider. Make sure you discuss any questions you have with your health care provider.   Document Released: 06/09/2000 Document Revised: 02/12/2013 Document Reviewed: 11/19/2012 [employer] Interactive Patient Education 2016 [employer]  

## 2015-05-28 LAB — CMP14+EGFR
A/G RATIO: 1.1 (ref 1.1–2.5)
ALK PHOS: 69 IU/L (ref 39–117)
ALT: 18 IU/L (ref 0–32)
AST: 33 IU/L (ref 0–40)
Albumin: 4.2 g/dL (ref 3.5–4.7)
BILIRUBIN TOTAL: 0.5 mg/dL (ref 0.0–1.2)
BUN / CREAT RATIO: 7 — AB (ref 11–26)
BUN: 4 mg/dL — ABNORMAL LOW (ref 8–27)
CHLORIDE: 99 mmol/L (ref 97–106)
CO2: 25 mmol/L (ref 18–29)
Calcium: 9.3 mg/dL (ref 8.7–10.3)
Creatinine, Ser: 0.59 mg/dL (ref 0.57–1.00)
GFR calc Af Amer: 99 mL/min/{1.73_m2} (ref >59)
GFR calc non Af Amer: 86 mL/min/{1.73_m2} (ref >59)
GLOBULIN, TOTAL: 3.8 g/dL (ref 1.5–4.5)
Glucose: 111 mg/dL — ABNORMAL HIGH (ref 65–99)
POTASSIUM: 3.7 mmol/L (ref 3.5–5.2)
SODIUM: 141 mmol/L (ref 136–144)
Total Protein: 8 g/dL (ref 6.0–8.5)

## 2015-05-28 LAB — LIPID PANEL
CHOLESTEROL TOTAL: 159 mg/dL (ref 100–199)
Chol/HDL Ratio: 2.3 ratio units (ref 0.0–4.4)
HDL: 68 mg/dL (ref >39)
LDL CALC: 75 mg/dL (ref 0–99)
Triglycerides: 78 mg/dL (ref 0–149)
VLDL Cholesterol Cal: 16 mg/dL (ref 5–40)

## 2015-05-28 LAB — MICROALBUMIN / CREATININE URINE RATIO
Creatinine, Urine: 43.5 mg/dL
MICROALB/CREAT RATIO: 9.4 mg/g{creat} (ref 0.0–30.0)
MICROALBUM., U, RANDOM: 4.1 ug/mL

## 2015-08-26 ENCOUNTER — Ambulatory Visit (INDEPENDENT_AMBULATORY_CARE_PROVIDER_SITE_OTHER): Payer: Medicare Other | Admitting: Nurse Practitioner

## 2015-08-26 ENCOUNTER — Ambulatory Visit (INDEPENDENT_AMBULATORY_CARE_PROVIDER_SITE_OTHER): Payer: Medicare Other

## 2015-08-26 ENCOUNTER — Encounter: Payer: Self-pay | Admitting: Nurse Practitioner

## 2015-08-26 VITALS — BP 138/80 | HR 80 | Temp 97.0°F | Ht 61.0 in | Wt 151.0 lb

## 2015-08-26 DIAGNOSIS — I10 Essential (primary) hypertension: Secondary | ICD-10-CM

## 2015-08-26 DIAGNOSIS — E876 Hypokalemia: Secondary | ICD-10-CM | POA: Diagnosis not present

## 2015-08-26 DIAGNOSIS — M8080XD Other osteoporosis with current pathological fracture, unspecified site, subsequent encounter for fracture with routine healing: Secondary | ICD-10-CM | POA: Diagnosis not present

## 2015-08-26 DIAGNOSIS — H409 Unspecified glaucoma: Secondary | ICD-10-CM

## 2015-08-26 DIAGNOSIS — Z6828 Body mass index (BMI) 28.0-28.9, adult: Secondary | ICD-10-CM | POA: Diagnosis not present

## 2015-08-26 DIAGNOSIS — E119 Type 2 diabetes mellitus without complications: Secondary | ICD-10-CM | POA: Diagnosis not present

## 2015-08-26 DIAGNOSIS — M47896 Other spondylosis, lumbar region: Secondary | ICD-10-CM | POA: Diagnosis not present

## 2015-08-26 DIAGNOSIS — E785 Hyperlipidemia, unspecified: Secondary | ICD-10-CM

## 2015-08-26 LAB — POCT GLYCOSYLATED HEMOGLOBIN (HGB A1C): Hemoglobin A1C: 5.6

## 2015-08-26 NOTE — Progress Notes (Signed)
Subjective:    Patient ID: Christina Delgado, female    DOB: 05-09-1931, 82 y.o.   MRN: [id number]  Patient here today for follow up of chronic medical problems.  Outpatient Encounter Prescriptions as of 08/26/2015  Medication Sig  . amLODipine-benazepril (LOTREL) 5-10 MG capsule Take 1 capsule by mouth daily.  Marland Kitchen aspirin EC 81 MG tablet Take 1 tablet (81 mg total) by mouth daily.  . Calcium Carbonate-Vitamin D (CALCIUM 600+D) 600-400 MG-UNIT per tablet Take 1 tablet by mouth daily.  Marland Kitchen latanoprost (XALATAN) 0.005 % ophthalmic solution Place 1 drop into the left eye at bedtime.  . Misc Natural Products (OSTEO BI-FLEX TRIPLE STRENGTH) TABS Take 4,000 each by mouth daily. Patient takes 2000 mg(2 tablets at one time daily)  . Multiple Vitamin (MULTIVITAMIN) tablet Take 1 tablet by mouth daily.  . Omega-3 Fatty Acids (FISH OIL) 1200 MG CAPS Take 1,200 mg by mouth 3 (three) times daily.  . potassium chloride SA (K-DUR,KLOR-CON) 20 MEQ tablet TAKE TWO TABLETS BY MOUTH DAILY  . timolol (TIMOPTIC) 0.5 % ophthalmic solution    No facility-administered encounter medications on file as of 08/26/2015.    Hypertension This is a chronic problem. The current episode started more than 1 year ago. The problem is controlled. Pertinent negatives include no chest pain, headaches, palpitations or shortness of breath. Risk factors for coronary artery disease include dyslipidemia, diabetes mellitus, family history, obesity and post-menopausal state. Past treatments include ACE inhibitors and calcium channel blockers. The current treatment provides moderate improvement.  Hyperlipidemia This is a chronic problem. The current episode started more than 1 year ago. The problem is controlled. Recent lipid tests were reviewed and are normal. Exacerbating diseases include diabetes and obesity. She has no history of hypothyroidism. Pertinent negatives include no chest pain or shortness of breath. She is currently on no  antihyperlipidemic treatment (makes her hurt and is to expensive.). The current treatment provides moderate improvement of lipids. Risk factors for coronary artery disease include diabetes mellitus, dyslipidemia, hypertension, obesity and post-menopausal.  Diabetes She presents for her follow-up diabetic visit. She has type 2 diabetes mellitus. No MedicAlert identification noted. Pertinent negatives for hypoglycemia include no dizziness or headaches. Pertinent negatives for diabetes include no chest pain, no polydipsia, no polyphagia, no polyuria and no visual change. Risk factors for coronary artery disease include diabetes mellitus, dyslipidemia, obesity, hypertension and post-menopausal. Current diabetic treatment includes diet. When asked about meal planning, she reported none. She has not had a previous visit with a dietitian. She rarely participates in exercise. Her breakfast blood glucose is taken between 9-10 am. Her breakfast blood glucose range is generally 130-140 mg/dl. Her overall blood glucose range is 130-140 mg/dl. An ACE inhibitor/angiotensin II receptor blocker is being taken. She does not see a podiatrist.Eye exam is current.  hypokalemia K-Dur daily- No C/O lower ext cramping DJD/osteoporosis Currently not taking anything. No c/o back pain today- says only has occasionally. Glaucoma Patient sees eye doctor every 6 months     Review of Systems  Constitutional: Negative.   Respiratory: Negative.  Negative for shortness of breath.   Cardiovascular: Negative for chest pain and palpitations.  Gastrointestinal: Negative for diarrhea and constipation.  Endocrine: Negative for polydipsia, polyphagia and polyuria.  Neurological: Negative for dizziness and headaches.  Psychiatric/Behavioral: Negative.  Negative for sleep disturbance.  All other systems reviewed and are negative.      Objective:   Physical Exam  Constitutional: She is oriented to person, place, and time. She  appears well-developed and well-nourished.  HENT:  Nose: Nose normal.  Mouth/Throat: Oropharynx is clear and moist.  Eyes: EOM are normal.  Neck: Trachea normal, normal range of motion and full passive range of motion without pain. Neck supple. No JVD present. Carotid bruit is not present. No thyromegaly present.  Cardiovascular: Normal rate, regular rhythm, normal heart sounds and intact distal pulses.  Exam reveals no gallop and no friction rub.   No murmur heard. Pulmonary/Chest: Effort normal and breath sounds normal.  Abdominal: Soft. Bowel sounds are normal. She exhibits no distension and no mass. There is no tenderness.  Musculoskeletal: Normal range of motion.  compression hose in place  Lymphadenopathy:    She has no cervical adenopathy.  Neurological: She is alert and oriented to person, place, and time. She has normal reflexes.  Skin: Skin is warm and dry.  Psychiatric: She has a normal mood and affect. Her behavior is normal. Judgment and thought content normal.   Temp(Src) 97 F (36.1 C) (Oral)  Ht _0  (1.549 m)  Wt 151 lb (68.493 kg)  BMI 28.55 kg/m2  Results for orders placed or performed in visit on 08/26/15  POCT glycosylated hemoglobin (Hb A1C)  Result Value Ref Range   Hemoglobin A1C 5.6    Chest xray- aortic atherosclerosis-Preliminary reading by Ronnald Collum, FNP  [facility]   EKG- NSR with occasional PVC's-Mary-Margaret Hassell Done, FNP      Assessment & Plan:  1. Essential hypertension  do not add salt to diet - CMP14+EGFR - DG Chest 2 View; Future - EKG 12-Lead  2. Type 2 diabetes mellitus without complication, without long-term current use of insulin ([name]) Continue to watch carbs in diet - POCT glycosylated hemoglobin (Hb A1C)  3. Hyperlipidemia with target LDL less than 100 Low fat diet - Lipid panel  4. Hypokalemia Call if develop leg cramps  5. BMI 28.0-28.9,adult Discussed diet and exercise for person with BMI >25 Will recheck weight in 3-6  months  6. Other osteoarthritis of spine, lumbar region  7. Osteoporosis with fracture, with routine healing, subsequent encounter  8. Glaucoma Keep follow up appointments with eye doctor ( Dr. Iona Hansen )    Labs pending Health maintenance reviewed Diet and exercise encouraged Continue all meds Follow up  In 3 month   [place], FNP

## 2015-08-26 NOTE — Patient Instructions (Signed)
Fall Prevention in the Home  Falls can cause injuries and can affect people from all age groups. There are many simple things that you can do to make your home safe and to help prevent falls. WHAT CAN I DO ON THE OUTSIDE OF MY HOME?  Regularly repair the edges of walkways and driveways and fix any cracks.  Remove high doorway thresholds.  Trim any shrubbery on the main path into your home.  Use bright outdoor lighting.  Clear walkways of debris and clutter, including tools and rocks.  Regularly check that handrails are securely fastened and in good repair. Both sides of any steps should have handrails.  Install guardrails along the edges of any raised decks or porches.  Have leaves, snow, and ice cleared regularly.  Use sand or salt on walkways during winter months.  In the garage, clean up any spills right away, including grease or oil spills. WHAT CAN I DO IN THE BATHROOM?  Use night lights.  Install grab bars by the toilet and in the tub and shower. Do not use towel bars as grab bars.  Use non-skid mats or decals on the floor of the tub or shower.  If you need to sit down while you are in the shower, use a plastic, non-slip stool..  Keep the floor dry. Immediately clean up any water that spills on the floor.  Remove soap buildup in the tub or shower on a regular basis.  Attach bath mats securely with double-sided non-slip rug tape.  Remove throw rugs and other tripping hazards from the floor. WHAT CAN I DO IN THE BEDROOM?  Use night lights.  Make sure that a bedside light is easy to reach.  Do not use oversized bedding that drapes onto the floor.  Have a firm chair that has side arms to use for getting dressed.  Remove throw rugs and other tripping hazards from the floor. WHAT CAN I DO IN THE KITCHEN?   Clean up any spills right away.  Avoid walking on wet floors.  Place frequently used items in easy-to-reach places.  If you need to reach for something  above you, use a sturdy step stool that has a grab bar.  Keep electrical cables out of the way.  Do not use floor polish or wax that makes floors slippery. If you have to use wax, make sure that it is non-skid floor wax.  Remove throw rugs and other tripping hazards from the floor. WHAT CAN I DO IN THE STAIRWAYS?  Do not leave any items on the stairs.  Make sure that there are handrails on both sides of the stairs. Fix handrails that are broken or loose. Make sure that handrails are as long as the stairways.  Check any carpeting to make sure that it is firmly attached to the stairs. Fix any carpet that is loose or worn.  Avoid having throw rugs at the top or bottom of stairways, or secure the rugs with carpet tape to prevent them from moving.  Make sure that you have a light switch at the top of the stairs and the bottom of the stairs. If you do not have them, have them installed. WHAT ARE SOME OTHER FALL PREVENTION TIPS?  Wear closed-toe shoes that fit well and support your feet. Wear shoes that have rubber soles or low heels.  When you use a stepladder, make sure that it is completely opened and that the sides are firmly locked. Have someone hold the ladder while you   are using it. Do not climb a closed stepladder.  Add color or contrast paint or tape to grab bars and handrails in your home. Place contrasting color strips on the first and last steps.  Use mobility aids as needed, such as canes, walkers, scooters, and crutches.  Turn on lights if it is dark. Replace any light bulbs that burn out.  Set up furniture so that there are clear paths. Keep the furniture in the same spot.  Fix any uneven floor surfaces.  Choose a carpet design that does not hide the edge of steps of a stairway.  Be aware of any and all pets.  Review your medicines with your healthcare provider. Some medicines can cause dizziness or changes in blood pressure, which increase your risk of falling. Talk  with your health care provider about other ways that you can decrease your risk of falls. This may include working with a physical therapist or trainer to improve your strength, balance, and endurance.   This information is not intended to replace advice given to you by your health care provider. Make sure you discuss any questions you have with your health care provider.   Document Released: 06/02/2002 Document Revised: 10/27/2014 Document Reviewed: 07/17/2014 Elsevier Interactive Patient Education 2016 [employer]  

## 2015-08-27 ENCOUNTER — Telehealth: Payer: Self-pay | Admitting: *Deleted

## 2015-08-27 LAB — CMP14+EGFR
ALK PHOS: 69 IU/L (ref 39–117)
ALT: 22 IU/L (ref 0–32)
AST: 29 IU/L (ref 0–40)
Albumin/Globulin Ratio: 1.2 (ref 1.1–2.5)
Albumin: 4.1 g/dL (ref 3.5–4.7)
BILIRUBIN TOTAL: 0.4 mg/dL (ref 0.0–1.2)
BUN/Creatinine Ratio: 6 — ABNORMAL LOW (ref 11–26)
BUN: 4 mg/dL — AB (ref 8–27)
CHLORIDE: 102 mmol/L (ref 96–106)
CO2: 24 mmol/L (ref 18–29)
Calcium: 9.3 mg/dL (ref 8.7–10.3)
Creatinine, Ser: 0.63 mg/dL (ref 0.57–1.00)
GFR calc Af Amer: 97 mL/min/{1.73_m2} (ref >59)
GFR calc non Af Amer: 84 mL/min/{1.73_m2} (ref >59)
GLUCOSE: 101 mg/dL — AB (ref 65–99)
Globulin, Total: 3.5 g/dL (ref 1.5–4.5)
POTASSIUM: 4.2 mmol/L (ref 3.5–5.2)
Sodium: 145 mmol/L — ABNORMAL HIGH (ref 134–144)
Total Protein: 7.6 g/dL (ref 6.0–8.5)

## 2015-08-27 LAB — LIPID PANEL
CHOLESTEROL TOTAL: 146 mg/dL (ref 100–199)
Chol/HDL Ratio: 2.2 ratio units (ref 0.0–4.4)
HDL: 67 mg/dL (ref >39)
LDL Calculated: 63 mg/dL (ref 0–99)
Triglycerides: 79 mg/dL (ref 0–149)
VLDL CHOLESTEROL CAL: 16 mg/dL (ref 5–40)

## 2015-08-27 NOTE — Telephone Encounter (Signed)
-----   Message from [facility]-Margaret Martin, FNP sent at 08/27/2015 11:01 AM EST ----- Hgba1c discussed at appointment Kidney and liver function stable Cholesterol looks good Continue current meds- low fat diet and exercise and recheck in 3 months

## 2015-08-27 NOTE — Telephone Encounter (Signed)
Pt notified of results Verbalizes understanding 

## 2015-12-03 ENCOUNTER — Encounter: Payer: Self-pay | Admitting: Nurse Practitioner

## 2015-12-03 ENCOUNTER — Ambulatory Visit (INDEPENDENT_AMBULATORY_CARE_PROVIDER_SITE_OTHER): Payer: Medicare Other | Admitting: Nurse Practitioner

## 2015-12-03 VITALS — BP 132/61 | HR 57 | Temp 96.8°F | Ht 61.0 in | Wt 147.0 lb

## 2015-12-03 DIAGNOSIS — M8080XD Other osteoporosis with current pathological fracture, unspecified site, subsequent encounter for fracture with routine healing: Secondary | ICD-10-CM | POA: Diagnosis not present

## 2015-12-03 DIAGNOSIS — E119 Type 2 diabetes mellitus without complications: Secondary | ICD-10-CM | POA: Diagnosis not present

## 2015-12-03 DIAGNOSIS — I10 Essential (primary) hypertension: Secondary | ICD-10-CM | POA: Diagnosis not present

## 2015-12-03 DIAGNOSIS — E876 Hypokalemia: Secondary | ICD-10-CM

## 2015-12-03 DIAGNOSIS — Z6828 Body mass index (BMI) 28.0-28.9, adult: Secondary | ICD-10-CM | POA: Diagnosis not present

## 2015-12-03 DIAGNOSIS — E785 Hyperlipidemia, unspecified: Secondary | ICD-10-CM

## 2015-12-03 DIAGNOSIS — H409 Unspecified glaucoma: Secondary | ICD-10-CM | POA: Diagnosis not present

## 2015-12-03 LAB — BAYER DCA HB A1C WAIVED: HB A1C (BAYER DCA - WAIVED): 5.4 % (ref <7.0)

## 2015-12-03 MED ORDER — AMLODIPINE BESY-BENAZEPRIL HCL 5-10 MG PO CAPS: 1.0000 | ORAL_CAPSULE | Freq: Every day | ORAL | Status: DC

## 2015-12-03 MED ORDER — POTASSIUM CHLORIDE CRYS ER 20 MEQ PO TBCR: EXTENDED_RELEASE_TABLET | ORAL | Status: DC

## 2015-12-03 NOTE — Progress Notes (Signed)
Subjective:    Patient ID: Christina Delgado, female    DOB: [birth year], 82 y.o.   MRN: [id number]  Patient here today for follow up of chronic medical problems.  Outpatient Encounter Prescriptions as of 12/03/2015  Medication Sig  . amLODipine-benazepril (LOTREL) 5-10 MG capsule Take 1 capsule by mouth daily.  Marland Kitchen aspirin EC 81 MG tablet Take 1 tablet (81 mg total) by mouth daily.  . Calcium Carbonate-Vitamin D (CALCIUM 600+D) 600-400 MG-UNIT per tablet Take 1 tablet by mouth daily.  Marland Kitchen latanoprost (XALATAN) 0.005 % ophthalmic solution Place 1 drop into the left eye at bedtime.  . Misc Natural Products (OSTEO BI-FLEX TRIPLE STRENGTH) TABS Take 4,000 each by mouth daily. Patient takes 2000 mg(2 tablets at one time daily)  . Multiple Vitamin (MULTIVITAMIN) tablet Take 1 tablet by mouth daily.  . Omega-3 Fatty Acids (FISH OIL) 1200 MG CAPS Take 1,200 mg by mouth 3 (three) times daily.  . potassium chloride SA (K-DUR,KLOR-CON) 20 MEQ tablet TAKE TWO TABLETS BY MOUTH DAILY  . timolol (TIMOPTIC) 0.5 % ophthalmic solution    No facility-administered encounter medications on file as of 12/03/2015.    Hypertension This is a chronic problem. The current episode started more than 1 year ago. The problem is controlled. Pertinent negatives include no chest pain, headaches, palpitations or shortness of breath. Risk factors for coronary artery disease include dyslipidemia, diabetes mellitus, family history, obesity and post-menopausal state. Past treatments include ACE inhibitors and calcium channel blockers. The current treatment provides moderate improvement.  Hyperlipidemia This is a chronic problem. The current episode started more than 1 year ago. The problem is controlled. Recent lipid tests were reviewed and are normal. Exacerbating diseases include diabetes and obesity. She has no history of hypothyroidism. Pertinent negatives include no chest pain or shortness of breath. She is currently on no  antihyperlipidemic treatment (makes her hurt and is to expensive.). The current treatment provides moderate improvement of lipids. Risk factors for coronary artery disease include diabetes mellitus, dyslipidemia, hypertension, obesity and post-menopausal.  Diabetes She presents for her follow-up diabetic visit. She has type 2 diabetes mellitus. No MedicAlert identification noted. Pertinent negatives for hypoglycemia include no dizziness or headaches. Pertinent negatives for diabetes include no chest pain, no polydipsia, no polyphagia, no polyuria and no visual change. Risk factors for coronary artery disease include diabetes mellitus, dyslipidemia, obesity, hypertension and post-menopausal. Current diabetic treatment includes diet. When asked about meal planning, she reported none. She has not had a previous visit with a dietitian. She rarely participates in exercise. Home blood sugar record trend: patient has not been checking blood sugars at home. An ACE inhibitor/angiotensin II receptor blocker is being taken. She does not see a podiatrist.Eye exam is current.  hypokalemia K-Dur daily- No C/O lower ext cramping DJD/osteoporosis Currently not taking anything. No c/o back pain today- says only has occasionally. Glaucoma Patient sees eye doctor every 6 months     Review of Systems  Constitutional: Negative.   Respiratory: Negative.  Negative for shortness of breath.   Cardiovascular: Negative for chest pain and palpitations.  Gastrointestinal: Negative for diarrhea and constipation.  Endocrine: Negative for polydipsia, polyphagia and polyuria.  Neurological: Negative for dizziness and headaches.  Psychiatric/Behavioral: Negative.  Negative for sleep disturbance.  All other systems reviewed and are negative.      Objective:   Physical Exam  Constitutional: She is oriented to person, place, and time. She appears well-developed and well-nourished.  HENT:  Nose: Nose normal.  Mouth/Throat:  Oropharynx is clear and moist.  Eyes: EOM are normal.  Neck: Trachea normal, normal range of motion and full passive range of motion without pain. Neck supple. No JVD present. Carotid bruit is not present. No thyromegaly present.  Cardiovascular: Normal rate, regular rhythm, normal heart sounds and intact distal pulses.  Exam reveals no gallop and no friction rub.   No murmur heard. Pulmonary/Chest: Effort normal and breath sounds normal.  Abdominal: Soft. Bowel sounds are normal. She exhibits no distension and no mass. There is no tenderness.  Musculoskeletal: Normal range of motion.  compression hose in place  Lymphadenopathy:    She has no cervical adenopathy.  Neurological: She is alert and oriented to person, place, and time. She has normal reflexes.  Skin: Skin is warm and dry.  Psychiatric: She has a normal mood and affect. Her behavior is normal. Judgment and thought content normal.   BP 132/61 mmHg  Pulse 57  Temp(Src) 96.8 F (36 C) (Oral)  Ht '5\' 1"'  (1.549 m)  Wt 147 lb (66.679 kg)  BMI 27.79 kg/m2  HGBA1c 5.4%    Assessment & Plan:  1. Essential hypertension Do not add salt to diet - CMP14+EGFR - amLODipine-benazepril (LOTREL) 5-10 MG capsule; Take 1 capsule by mouth daily.  Dispense: 30 capsule; Refill: 5  2. Type 2 diabetes mellitus without complication, without long-term current use of insulin ([name]) Continue to watch carbs in diet - Bayer DCA Hb A1c Waived  3. Osteoporosis with fracture, with routine healing, subsequent encounter Weight bearing exercises when can tolerate  4. Glaucoma Keep follow up appointment with eye doctor ( Dr. Yolanda Bonine)  5. Hyperlipidemia with target LDL less than 100 Low fat diet - Lipid panel  6. Hypokalemia - potassium chloride SA (K-DUR,KLOR-CON) 20 MEQ tablet; TAKE TWO TABLETS BY MOUTH DAILY  Dispense: 60 tablet; Refill: 5  7. BMI 28.0-28.9,adult Discussed diet and exercise for person with BMI >25 Will recheck weight in 3-6  months    Labs pending Health maintenance reviewed Diet and exercise encouraged Continue all meds Follow up  In 3 month   [place], FNP

## 2015-12-03 NOTE — Patient Instructions (Signed)
Fall Prevention in the Home  Falls can cause injuries and can affect people from all age groups. There are many simple things that you can do to make your home safe and to help prevent falls. WHAT CAN I DO ON THE OUTSIDE OF MY HOME?  Regularly repair the edges of walkways and driveways and fix any cracks.  Remove high doorway thresholds.  Trim any shrubbery on the main path into your home.  Use bright outdoor lighting.  Clear walkways of debris and clutter, including tools and rocks.  Regularly check that handrails are securely fastened and in good repair. Both sides of any steps should have handrails.  Install guardrails along the edges of any raised decks or porches.  Have leaves, snow, and ice cleared regularly.  Use sand or salt on walkways during winter months.  In the garage, clean up any spills right away, including grease or oil spills. WHAT CAN I DO IN THE BATHROOM?  Use night lights.  Install grab bars by the toilet and in the tub and shower. Do not use towel bars as grab bars.  Use non-skid mats or decals on the floor of the tub or shower.  If you need to sit down while you are in the shower, use a plastic, non-slip stool..  Keep the floor dry. Immediately clean up any water that spills on the floor.  Remove soap buildup in the tub or shower on a regular basis.  Attach bath mats securely with double-sided non-slip rug tape.  Remove throw rugs and other tripping hazards from the floor. WHAT CAN I DO IN THE BEDROOM?  Use night lights.  Make sure that a bedside light is easy to reach.  Do not use oversized bedding that drapes onto the floor.  Have a firm chair that has side arms to use for getting dressed.  Remove throw rugs and other tripping hazards from the floor. WHAT CAN I DO IN THE KITCHEN?   Clean up any spills right away.  Avoid walking on wet floors.  Place frequently used items in easy-to-reach places.  If you need to reach for something  above you, use a sturdy step stool that has a grab bar.  Keep electrical cables out of the way.  Do not use floor polish or wax that makes floors slippery. If you have to use wax, make sure that it is non-skid floor wax.  Remove throw rugs and other tripping hazards from the floor. WHAT CAN I DO IN THE STAIRWAYS?  Do not leave any items on the stairs.  Make sure that there are handrails on both sides of the stairs. Fix handrails that are broken or loose. Make sure that handrails are as long as the stairways.  Check any carpeting to make sure that it is firmly attached to the stairs. Fix any carpet that is loose or worn.  Avoid having throw rugs at the top or bottom of stairways, or secure the rugs with carpet tape to prevent them from moving.  Make sure that you have a light switch at the top of the stairs and the bottom of the stairs. If you do not have them, have them installed. WHAT ARE SOME OTHER FALL PREVENTION TIPS?  Wear closed-toe shoes that fit well and support your feet. Wear shoes that have rubber soles or low heels.  When you use a stepladder, make sure that it is completely opened and that the sides are firmly locked. Have someone hold the ladder while you   are using it. Do not climb a closed stepladder.  Add color or contrast paint or tape to grab bars and handrails in your home. Place contrasting color strips on the first and last steps.  Use mobility aids as needed, such as canes, walkers, scooters, and crutches.  Turn on lights if it is dark. Replace any light bulbs that burn out.  Set up furniture so that there are clear paths. Keep the furniture in the same spot.  Fix any uneven floor surfaces.  Choose a carpet design that does not hide the edge of steps of a stairway.  Be aware of any and all pets.  Review your medicines with your healthcare provider. Some medicines can cause dizziness or changes in blood pressure, which increase your risk of falling. Talk  with your health care provider about other ways that you can decrease your risk of falls. This may include working with a physical therapist or trainer to improve your strength, balance, and endurance.   This information is not intended to replace advice given to you by your health care provider. Make sure you discuss any questions you have with your health care provider.   Document Released: 06/02/2002 Document Revised: 10/27/2014 Document Reviewed: 07/17/2014 Elsevier Interactive Patient Education 2016 [employer]  

## 2015-12-04 LAB — CMP14+EGFR
ALBUMIN: 4.4 g/dL (ref 3.5–4.7)
ALK PHOS: 69 IU/L (ref 39–117)
ALT: 23 IU/L (ref 0–32)
AST: 30 IU/L (ref 0–40)
Albumin/Globulin Ratio: 1.2 (ref 1.2–2.2)
BILIRUBIN TOTAL: 0.5 mg/dL (ref 0.0–1.2)
BUN / CREAT RATIO: 7 — AB (ref 12–28)
BUN: 4 mg/dL — ABNORMAL LOW (ref 8–27)
CHLORIDE: 97 mmol/L (ref 96–106)
CO2: 27 mmol/L (ref 18–29)
Calcium: 9.6 mg/dL (ref 8.7–10.3)
Creatinine, Ser: 0.59 mg/dL (ref 0.57–1.00)
GFR calc Af Amer: 99 mL/min/{1.73_m2} (ref >59)
GFR calc non Af Amer: 86 mL/min/{1.73_m2} (ref >59)
GLOBULIN, TOTAL: 3.6 g/dL (ref 1.5–4.5)
Glucose: 103 mg/dL — ABNORMAL HIGH (ref 65–99)
POTASSIUM: 3.7 mmol/L (ref 3.5–5.2)
SODIUM: 141 mmol/L (ref 134–144)
Total Protein: 8 g/dL (ref 6.0–8.5)

## 2015-12-04 LAB — LIPID PANEL
CHOLESTEROL TOTAL: 146 mg/dL (ref 100–199)
Chol/HDL Ratio: 2.3 ratio units (ref 0.0–4.4)
HDL: 64 mg/dL (ref >39)
LDL Calculated: 66 mg/dL (ref 0–99)
Triglycerides: 82 mg/dL (ref 0–149)
VLDL Cholesterol Cal: 16 mg/dL (ref 5–40)

## 2015-12-06 ENCOUNTER — Telehealth: Payer: Self-pay | Admitting: Nurse Practitioner

## 2015-12-06 NOTE — Telephone Encounter (Signed)
Patient aware of results.

## 2016-01-07 ENCOUNTER — Encounter: Payer: Self-pay | Admitting: Nurse Practitioner

## 2016-02-17 ENCOUNTER — Other Ambulatory Visit: Payer: Self-pay | Admitting: Nurse Practitioner

## 2016-02-17 DIAGNOSIS — E876 Hypokalemia: Secondary | ICD-10-CM

## 2016-02-17 DIAGNOSIS — I10 Essential (primary) hypertension: Secondary | ICD-10-CM

## 2016-02-17 MED ORDER — AMLODIPINE BESY-BENAZEPRIL HCL 5-10 MG PO CAPS: 1.0000 | ORAL_CAPSULE | Freq: Every day | ORAL | 5 refills | Status: DC

## 2016-02-17 MED ORDER — POTASSIUM CHLORIDE CRYS ER 20 MEQ PO TBCR: EXTENDED_RELEASE_TABLET | ORAL | 5 refills | Status: DC

## 2016-03-09 ENCOUNTER — Ambulatory Visit (INDEPENDENT_AMBULATORY_CARE_PROVIDER_SITE_OTHER): Payer: Medicare Other | Admitting: Nurse Practitioner

## 2016-03-09 ENCOUNTER — Encounter: Payer: Self-pay | Admitting: Nurse Practitioner

## 2016-03-09 VITALS — BP 112/68 | HR 72 | Temp 97.8°F | Ht 61.0 in | Wt 146.0 lb

## 2016-03-09 DIAGNOSIS — E785 Hyperlipidemia, unspecified: Secondary | ICD-10-CM

## 2016-03-09 DIAGNOSIS — E876 Hypokalemia: Secondary | ICD-10-CM | POA: Diagnosis not present

## 2016-03-09 DIAGNOSIS — H409 Unspecified glaucoma: Secondary | ICD-10-CM | POA: Diagnosis not present

## 2016-03-09 DIAGNOSIS — I10 Essential (primary) hypertension: Secondary | ICD-10-CM

## 2016-03-09 DIAGNOSIS — Z6828 Body mass index (BMI) 28.0-28.9, adult: Secondary | ICD-10-CM | POA: Diagnosis not present

## 2016-03-09 DIAGNOSIS — M47896 Other spondylosis, lumbar region: Secondary | ICD-10-CM

## 2016-03-09 DIAGNOSIS — E119 Type 2 diabetes mellitus without complications: Secondary | ICD-10-CM | POA: Diagnosis not present

## 2016-03-09 LAB — BAYER DCA HB A1C WAIVED: HB A1C (BAYER DCA - WAIVED): 5.3 % (ref <7.0)

## 2016-03-09 NOTE — Progress Notes (Signed)
Subjective:    Patient ID: Christina Delgado, female    DOB: [birth year], 82 y.o.   MRN: [id number]  Patient here today for follow up of chronic medical problems. No changes since last visit. No complaints today.  Outpatient Encounter Prescriptions as of 03/09/2016  Medication Sig  . amLODipine-benazepril (LOTREL) 5-10 MG capsule Take 1 capsule by mouth daily.  Marland Kitchen aspirin EC 81 MG tablet Take 1 tablet (81 mg total) by mouth daily.  . Calcium Carbonate-Vitamin D (CALCIUM 600+D) 600-400 MG-UNIT per tablet Take 1 tablet by mouth daily.  Marland Kitchen latanoprost (XALATAN) 0.005 % ophthalmic solution Place 1 drop into the left eye at bedtime.  . Misc Natural Products (OSTEO BI-FLEX TRIPLE STRENGTH) TABS Take 4,000 each by mouth daily. Patient takes 2000 mg(2 tablets at one time daily)  . Multiple Vitamin (MULTIVITAMIN) tablet Take 1 tablet by mouth daily.  . Omega-3 Fatty Acids (FISH OIL) 1200 MG CAPS Take 1,200 mg by mouth 3 (three) times daily.  . potassium chloride SA (K-DUR,KLOR-CON) 20 MEQ tablet TAKE TWO TABLETS BY MOUTH DAILY  . timolol (TIMOPTIC) 0.5 % ophthalmic solution    No facility-administered encounter medications on file as of 03/09/2016.     Hypertension  This is a chronic problem. The current episode started more than 1 year ago. The problem is controlled. Pertinent negatives include no chest pain, headaches, palpitations or shortness of breath. Risk factors for coronary artery disease include dyslipidemia, diabetes mellitus, family history, obesity and post-menopausal state. Past treatments include ACE inhibitors and calcium channel blockers. The current treatment provides moderate improvement.  Hyperlipidemia  This is a chronic problem. The current episode started more than 1 year ago. The problem is controlled. Recent lipid tests were reviewed and are normal. Exacerbating diseases include diabetes and obesity. She has no history of hypothyroidism. Pertinent negatives include no chest pain or  shortness of breath. She is currently on no antihyperlipidemic treatment (makes her hurt and is to expensive.). The current treatment provides moderate improvement of lipids. Risk factors for coronary artery disease include diabetes mellitus, dyslipidemia, hypertension, obesity and post-menopausal.  Diabetes  She presents for her follow-up diabetic visit. She has type 2 diabetes mellitus. No MedicAlert identification noted. Pertinent negatives for hypoglycemia include no dizziness or headaches. Pertinent negatives for diabetes include no chest pain, no polydipsia, no polyphagia, no polyuria and no visual change. Risk factors for coronary artery disease include diabetes mellitus, dyslipidemia, obesity, hypertension and post-menopausal. Current diabetic treatment includes diet. When asked about meal planning, she reported none. She has not had a previous visit with a dietitian. She rarely participates in exercise. Home blood sugar record trend: Patient does not check blood sugars at home. An ACE inhibitor/angiotensin II receptor blocker is being taken. She does not see a podiatrist.Eye exam is current.  hypokalemia K-Dur daily- No C/O lower ext cramping DJD/osteoporosis Currently not taking anything. No c/o back pain today- says only has occasionally. Glaucoma Patient sees eye doctor every 6 months     Review of Systems  Constitutional: Negative.   Respiratory: Negative.  Negative for shortness of breath.   Cardiovascular: Negative for chest pain and palpitations.  Gastrointestinal: Negative for constipation and diarrhea.  Endocrine: Negative for polydipsia, polyphagia and polyuria.  Neurological: Negative for dizziness and headaches.  Psychiatric/Behavioral: Negative.  Negative for sleep disturbance.  All other systems reviewed and are negative.      Objective:   Physical Exam  Constitutional: She is oriented to person, place, and time. She appears well-developed  and well-nourished.  HENT:   Nose: Nose normal.  Mouth/Throat: Oropharynx is clear and moist.  Eyes: EOM are normal.  Neck: Trachea normal, normal range of motion and full passive range of motion without pain. Neck supple. No JVD present. Carotid bruit is not present. No thyromegaly present.  Cardiovascular: Normal rate, regular rhythm, normal heart sounds and intact distal pulses.  Exam reveals no gallop and no friction rub.   No murmur heard. Pulmonary/Chest: Effort normal and breath sounds normal.  Abdominal: Soft. Bowel sounds are normal. She exhibits no distension and no mass. There is no tenderness.  Musculoskeletal: Normal range of motion.  compression hose in place  Lymphadenopathy:    She has no cervical adenopathy.  Neurological: She is alert and oriented to person, place, and time. She has normal reflexes.  Skin: Skin is warm and dry.  Psychiatric: She has a normal mood and affect. Her behavior is normal. Judgment and thought content normal.   BP 112/68   Pulse 72   Temp 97.8 F (36.6 C) (Oral)   Ht '5\' 1"'  (1.549 m)   Wt 146 lb (66.2 kg)   BMI 27.59 kg/m    HGBA1c 5.3%    Assessment & Plan:  1. Essential hypertension Do not add salt to diet - CMP14+EGFR  2. Type 2 diabetes mellitus without complication, without long-term current use of insulin ([name]) Continue to watch carbs in diet - Bayer DCA Hb A1c Waived - Lipid panel  3. Other osteoarthritis of spine, lumbar region  4. BMI 28.0-28.9,adult Discussed diet and exercise for person with BMI >25 Will recheck weight in 3-6 months  5. Glaucoma Keep follow up with eye doctor  6. Hyperlipidemia with target LDL less than 100 Low fta diet  7. Hypokalemia    Labs pending Health maintenance reviewed Diet and exercise encouraged Continue all meds Follow up  In 3 months [place], FNP

## 2016-03-10 LAB — CMP14+EGFR
A/G RATIO: 1.2 (ref 1.2–2.2)
ALK PHOS: 58 IU/L (ref 39–117)
ALT: 21 IU/L (ref 0–32)
AST: 32 IU/L (ref 0–40)
Albumin: 4.1 g/dL (ref 3.5–4.7)
BILIRUBIN TOTAL: 0.5 mg/dL (ref 0.0–1.2)
BUN / CREAT RATIO: 13 (ref 12–28)
BUN: 7 mg/dL — ABNORMAL LOW (ref 8–27)
CALCIUM: 9.4 mg/dL (ref 8.7–10.3)
CHLORIDE: 99 mmol/L (ref 96–106)
CO2: 28 mmol/L (ref 18–29)
Creatinine, Ser: 0.53 mg/dL — ABNORMAL LOW (ref 0.57–1.00)
GFR calc non Af Amer: 88 mL/min/{1.73_m2} (ref >59)
GFR, EST AFRICAN AMERICAN: 102 mL/min/{1.73_m2} (ref >59)
GLUCOSE: 92 mg/dL (ref 65–99)
Globulin, Total: 3.3 g/dL (ref 1.5–4.5)
POTASSIUM: 3.8 mmol/L (ref 3.5–5.2)
Sodium: 142 mmol/L (ref 134–144)
Total Protein: 7.4 g/dL (ref 6.0–8.5)

## 2016-03-10 LAB — LIPID PANEL
CHOLESTEROL TOTAL: 142 mg/dL (ref 100–199)
Chol/HDL Ratio: 2.5 ratio units (ref 0.0–4.4)
HDL: 57 mg/dL (ref >39)
LDL Calculated: 68 mg/dL (ref 0–99)
Triglycerides: 83 mg/dL (ref 0–149)
VLDL Cholesterol Cal: 17 mg/dL (ref 5–40)

## 2016-05-26 LAB — HM DIABETES EYE EXAM

## 2016-06-09 ENCOUNTER — Ambulatory Visit: Payer: Medicare Other | Admitting: Nurse Practitioner

## 2016-06-20 ENCOUNTER — Encounter: Payer: Self-pay | Admitting: Nurse Practitioner

## 2016-06-20 ENCOUNTER — Ambulatory Visit (INDEPENDENT_AMBULATORY_CARE_PROVIDER_SITE_OTHER): Payer: Medicare Other | Admitting: Nurse Practitioner

## 2016-06-20 VITALS — BP 110/64 | Temp 97.1°F | Ht 61.0 in | Wt 140.0 lb

## 2016-06-20 DIAGNOSIS — Z6828 Body mass index (BMI) 28.0-28.9, adult: Secondary | ICD-10-CM

## 2016-06-20 DIAGNOSIS — M8000XD Age-related osteoporosis with current pathological fracture, unspecified site, subsequent encounter for fracture with routine healing: Secondary | ICD-10-CM

## 2016-06-20 DIAGNOSIS — E119 Type 2 diabetes mellitus without complications: Secondary | ICD-10-CM

## 2016-06-20 DIAGNOSIS — E876 Hypokalemia: Secondary | ICD-10-CM | POA: Diagnosis not present

## 2016-06-20 DIAGNOSIS — I10 Essential (primary) hypertension: Secondary | ICD-10-CM

## 2016-06-20 DIAGNOSIS — K759 Inflammatory liver disease, unspecified: Secondary | ICD-10-CM | POA: Diagnosis not present

## 2016-06-20 DIAGNOSIS — E785 Hyperlipidemia, unspecified: Secondary | ICD-10-CM

## 2016-06-20 DIAGNOSIS — H409 Unspecified glaucoma: Secondary | ICD-10-CM

## 2016-06-20 DIAGNOSIS — M47896 Other spondylosis, lumbar region: Secondary | ICD-10-CM | POA: Diagnosis not present

## 2016-06-20 LAB — BAYER DCA HB A1C WAIVED: HB A1C: 6 % (ref <7.0)

## 2016-06-20 MED ORDER — POTASSIUM CHLORIDE CRYS ER 20 MEQ PO TBCR: EXTENDED_RELEASE_TABLET | ORAL | 5 refills | Status: DC

## 2016-06-20 MED ORDER — AMLODIPINE BESY-BENAZEPRIL HCL 5-10 MG PO CAPS: 1.0000 | ORAL_CAPSULE | Freq: Every day | ORAL | 5 refills | Status: DC

## 2016-06-20 NOTE — Progress Notes (Signed)
Subjective:    Patient ID: Christina Delgado, female    DOB: 1931/01/19, 82 y.o.   MRN: [id number]  Patient here today for follow up of chronic medical problems. No changes since last visit. No complaints today.  Outpatient Encounter Prescriptions as of 06/20/2016  Medication Sig  . aspirin EC 81 MG tablet Take 1 tablet (81 mg total) by mouth daily.  . Calcium Carbonate-Vitamin D (CALCIUM 600+D) 600-400 MG-UNIT per tablet Take 1 tablet by mouth daily.  Marland Kitchen latanoprost (XALATAN) 0.005 % ophthalmic solution Place 1 drop into the left eye at bedtime.  . Misc Natural Products (OSTEO BI-FLEX TRIPLE STRENGTH) TABS Take 4,000 each by mouth daily. Patient takes 2000 mg(2 tablets at one time daily)  . amLODipine-benazepril (LOTREL) 5-10 MG capsule Take 1 capsule by mouth daily. (Patient not taking: Reported on 06/20/2016)  . brimonidine (ALPHAGAN) 0.15 % ophthalmic solution   . dorzolamide-timolol (COSOPT) 22.3-6.8 MG/ML ophthalmic solution   . Multiple Vitamin (MULTIVITAMIN) tablet Take 1 tablet by mouth daily.  . Omega-3 Fatty Acids (FISH OIL) 1200 MG CAPS Take 1,200 mg by mouth 3 (three) times daily.  . potassium chloride SA (K-DUR,KLOR-CON) 20 MEQ tablet TAKE TWO TABLETS BY MOUTH DAILY (Patient not taking: Reported on 06/20/2016)  . timolol (TIMOPTIC) 0.5 % ophthalmic solution    No facility-administered encounter medications on file as of 06/20/2016.     Hypertension  This is a chronic problem. The current episode started more than 1 year ago. The problem is controlled. Pertinent negatives include no chest pain, headaches, palpitations or shortness of breath. Risk factors for coronary artery disease include dyslipidemia, diabetes mellitus, family history, obesity and post-menopausal state. Past treatments include ACE inhibitors and calcium channel blockers. The current treatment provides moderate improvement.  Hyperlipidemia  This is a chronic problem. The current episode started more than 1 year  ago. The problem is controlled. Recent lipid tests were reviewed and are normal. Exacerbating diseases include diabetes and obesity. She has no history of hypothyroidism. Pertinent negatives include no chest pain or shortness of breath. She is currently on no antihyperlipidemic treatment (makes her hurt and is to expensive.). The current treatment provides moderate improvement of lipids. Risk factors for coronary artery disease include diabetes mellitus, dyslipidemia, hypertension, obesity and post-menopausal.  Diabetes  She presents for her follow-up diabetic visit. She has type 2 diabetes mellitus. No MedicAlert identification noted. Pertinent negatives for hypoglycemia include no dizziness or headaches. Pertinent negatives for diabetes include no chest pain, no polydipsia, no polyphagia, no polyuria and no visual change. Risk factors for coronary artery disease include diabetes mellitus, dyslipidemia, obesity, hypertension and post-menopausal. Current diabetic treatment includes diet. When asked about meal planning, she reported none. She has not had a previous visit with a dietitian. She rarely participates in exercise. Home blood sugar record trend: Patient does not check blood sugars at home. An ACE inhibitor/angiotensin II receptor blocker is being taken. She does not see a podiatrist.Eye exam is current.  hypokalemia K-Dur daily- No C/O lower ext cramping DJD/osteoporosis Currently not taking anything. No c/o back pain today- says only has occasionally. Glaucoma Patient sees eye doctor every 6 months  * Daughter is with her today and says taht she has had a biad cough through the holidays.- not taking any OTC meds other then using cough drops  Review of Systems  Constitutional: Negative.   Respiratory: Negative.  Negative for shortness of breath.   Cardiovascular: Negative for chest pain and palpitations.  Gastrointestinal: Negative for constipation  and diarrhea.  Endocrine: Negative for  polydipsia, polyphagia and polyuria.  Neurological: Negative for dizziness and headaches.  Psychiatric/Behavioral: Negative.  Negative for sleep disturbance.  All other systems reviewed and are negative.      Objective:   Physical Exam  Constitutional: She is oriented to person, place, and time. She appears well-developed and well-nourished.  HENT:  Nose: Nose normal.  Mouth/Throat: Oropharynx is clear and moist.  Eyes: EOM are normal.  Neck: Trachea normal, normal range of motion and full passive range of motion without pain. Neck supple. No JVD present. Carotid bruit is not present. No thyromegaly present.  Cardiovascular: Normal rate, regular rhythm, normal heart sounds and intact distal pulses.  Exam reveals no gallop and no friction rub.   No murmur heard. Pulmonary/Chest: Effort normal and breath sounds normal.  Abdominal: Soft. Bowel sounds are normal. She exhibits no distension and no mass. There is no tenderness.  Musculoskeletal: Normal range of motion.  compression hose in place  Lymphadenopathy:    She has no cervical adenopathy.  Neurological: She is alert and oriented to person, place, and time. She has normal reflexes.  Skin: Skin is warm and dry.  Psychiatric: She has a normal mood and affect. Her behavior is normal. Judgment and thought content normal.   Temp 97.1 F (36.2 C) (Oral)   Ht '5\' 1"'  (1.549 m)   Wt 140 lb (63.5 kg)   BMI 26.45 kg/m     HGBA1c 6.0%    Assessment & Plan:  1. Essential hypertension Low sodium diet - CMP14+EGFR  2. Type 2 diabetes mellitus without complication, without long-term current use of insulin ([name]) Watch carbs in diet - Bayer DCA Hb A1c Waived - Microalbumin / creatinine urine ratio  3. Hyperlipidemia with target LDL less than 100 Low fat diet - Lipid panel  4. Hepatitis  5. Other osteoarthritis of spine, lumbar region Unable to do weight bearing exercises  6. Pathological fracture due to osteoporosis with  routine healing, unspecified fracture site, unspecified osteoporosis type, subsequent encounter  7. BMI 28.0-28.9,adult Discussed diet and exercise for person with BMI >25 Will recheck weight in 3-6 months  8. Glaucoma, unspecified glaucoma type, unspecified laterality Keep follow up with eye doctor  9. Hypokalemia   Add boost daily to diet- no more weight loss Labs pending Health maintenance reviewed Diet and exercise encouraged Continue all meds Follow up  In 3 months   [place], FNP

## 2016-06-20 NOTE — Patient Instructions (Signed)
Fall Prevention in the Home Introduction Falls can cause injuries. They can happen to people of all ages. There are many things you can do to make your home safe and to help prevent falls. What can I do on the outside of my home?  Regularly fix the edges of walkways and driveways and fix any cracks.  Remove anything that might make you trip as you walk through a door, such as a raised step or threshold.  Trim any bushes or trees on the path to your home.  Use bright outdoor lighting.  Clear any walking paths of anything that might make someone trip, such as rocks or tools.  Regularly check to see if handrails are loose or broken. Make sure that both sides of any steps have handrails.  Any raised decks and porches should have guardrails on the edges.  Have any leaves, snow, or ice cleared regularly.  Use sand or salt on walking paths during winter.  Clean up any spills in your garage right away. This includes oil or grease spills. What can I do in the bathroom?  Use night lights.  Install grab bars by the toilet and in the tub and shower. Do not use towel bars as grab bars.  Use non-skid mats or decals in the tub or shower.  If you need to sit down in the shower, use a plastic, non-slip stool.  Keep the floor dry. Clean up any water that spills on the floor as soon as it happens.  Remove soap buildup in the tub or shower regularly.  Attach bath mats securely with double-sided non-slip rug tape.  Do not have throw rugs and other things on the floor that can make you trip. What can I do in the bedroom?  Use night lights.  Make sure that you have a light by your bed that is easy to reach.  Do not use any sheets or blankets that are too big for your bed. They should not hang down onto the floor.  Have a firm chair that has side arms. You can use this for support while you get dressed.  Do not have throw rugs and other things on the floor that can make you trip. What can  I do in the kitchen?  Clean up any spills right away.  Avoid walking on wet floors.  Keep items that you use a lot in easy-to-reach places.  If you need to reach something above you, use a strong step stool that has a grab bar.  Keep electrical cords out of the way.  Do not use floor polish or wax that makes floors slippery. If you must use wax, use non-skid floor wax.  Do not have throw rugs and other things on the floor that can make you trip. What can I do with my stairs?  Do not leave any items on the stairs.  Make sure that there are handrails on both sides of the stairs and use them. Fix handrails that are broken or loose. Make sure that handrails are as long as the stairways.  Check any carpeting to make sure that it is firmly attached to the stairs. Fix any carpet that is loose or worn.  Avoid having throw rugs at the top or bottom of the stairs. If you do have throw rugs, attach them to the floor with carpet tape.  Make sure that you have a light switch at the top of the stairs and the bottom of the stairs. If you   do not have them, ask someone to add them for you. What else can I do to help prevent falls?  Wear shoes that:  Do not have high heels.  Have rubber bottoms.  Are comfortable and fit you well.  Are closed at the toe. Do not wear sandals.  If you use a stepladder:  Make sure that it is fully opened. Do not climb a closed stepladder.  Make sure that both sides of the stepladder are locked into place.  Ask someone to hold it for you, if possible.  Clearly mark and make sure that you can see:  Any grab bars or handrails.  First and last steps.  Where the edge of each step is.  Use tools that help you move around (mobility aids) if they are needed. These include:  Canes.  Walkers.  Scooters.  Crutches.  Turn on the lights when you go into a dark area. Replace any light bulbs as soon as they burn out.  Set up your furniture so you have a  clear path. Avoid moving your furniture around.  If any of your floors are uneven, fix them.  If there are any pets around you, be aware of where they are.  Review your medicines with your doctor. Some medicines can make you feel dizzy. This can increase your chance of falling. Ask your doctor what other things that you can do to help prevent falls. This information is not intended to replace advice given to you by your health care provider. Make sure you discuss any questions you have with your health care provider. Document Released: 04/08/2009 Document Revised: 11/18/2015 Document Reviewed: 07/17/2014  2017 Elsevier  

## 2016-06-21 LAB — CMP14+EGFR
A/G RATIO: 1.1 — AB (ref 1.2–2.2)
ALT: 22 IU/L (ref 0–32)
AST: 33 IU/L (ref 0–40)
Albumin: 3.8 g/dL (ref 3.5–4.7)
Alkaline Phosphatase: 53 IU/L (ref 39–117)
BILIRUBIN TOTAL: 0.3 mg/dL (ref 0.0–1.2)
BUN/Creatinine Ratio: 10 — ABNORMAL LOW (ref 12–28)
BUN: 6 mg/dL — ABNORMAL LOW (ref 8–27)
CHLORIDE: 102 mmol/L (ref 96–106)
CO2: 25 mmol/L (ref 18–29)
Calcium: 9.4 mg/dL (ref 8.7–10.3)
Creatinine, Ser: 0.58 mg/dL (ref 0.57–1.00)
GFR calc Af Amer: 99 mL/min/{1.73_m2} (ref >59)
GFR calc non Af Amer: 86 mL/min/{1.73_m2} (ref >59)
Globulin, Total: 3.5 g/dL (ref 1.5–4.5)
Glucose: 103 mg/dL — ABNORMAL HIGH (ref 65–99)
POTASSIUM: 4.2 mmol/L (ref 3.5–5.2)
Sodium: 142 mmol/L (ref 134–144)
Total Protein: 7.3 g/dL (ref 6.0–8.5)

## 2016-06-21 LAB — MICROALBUMIN / CREATININE URINE RATIO
Creatinine, Urine: 128 mg/dL
Microalb/Creat Ratio: 12.4 mg/g{creat} (ref 0.0–30.0)
Microalbumin, Urine: 15.9 ug/mL

## 2016-06-21 LAB — LIPID PANEL
Chol/HDL Ratio: 3.3 ratio (ref 0.0–4.4)
Cholesterol, Total: 144 mg/dL (ref 100–199)
HDL: 44 mg/dL
LDL Calculated: 86 mg/dL (ref 0–99)
Triglycerides: 68 mg/dL (ref 0–149)
VLDL Cholesterol Cal: 14 mg/dL (ref 5–40)

## 2016-06-22 ENCOUNTER — Telehealth: Payer: Self-pay | Admitting: Nurse Practitioner

## 2016-06-22 ENCOUNTER — Ambulatory Visit: Payer: Medicare Other | Admitting: Nurse Practitioner

## 2016-06-22 ENCOUNTER — Encounter: Payer: Self-pay | Admitting: Family Medicine

## 2016-06-22 NOTE — Telephone Encounter (Signed)
Pt wanted to make you aware of new eye drops

## 2016-06-29 ENCOUNTER — Ambulatory Visit (INDEPENDENT_AMBULATORY_CARE_PROVIDER_SITE_OTHER): Payer: Medicare Other | Admitting: *Deleted

## 2016-06-29 VITALS — BP 146/66 | Ht 61.0 in | Wt 141.0 lb

## 2016-06-29 DIAGNOSIS — Z0000 Encounter for general adult medical examination without abnormal findings: Secondary | ICD-10-CM | POA: Diagnosis not present

## 2016-06-29 NOTE — Progress Notes (Addendum)
Subjective:   Christina Delgado is a 83 y.o. female who presents for an Initial Medicare Annual Wellness Visit. [name] lives at home alone. She has 4 adult children that help her out. Her husband of 57 years passed a way last year. She is coping well. She is accompanied by her daughter today.   Review of Systems    Reports some problems with balance s/p ankle fracture 2 years ago and subsequent use of cam walker for about 1 year. She uses a cane and moves slowly to avoid falls.   Cardiac Risk Factors include: advanced age (>4755men, 57>65 women);hypertension;sedentary lifestyle   Other systems negative.     Objective:    Today's Vitals   06/29/16 1611  BP: (!) 146/66  Weight: 141 lb (64 kg)  Height: 5\' 1"  (1.549 m)   Body mass index is 26.64 kg/m.   Current Medications (verified) Outpatient Encounter Prescriptions as of 06/29/2016  Medication Sig  . amLODipine-benazepril (LOTREL) 5-10 MG capsule Take 1 capsule by mouth daily.  . brimonidine (ALPHAGAN) 0.15 % ophthalmic solution Place 1 drop into both eyes 2 (two) times daily.  . Calcium Carbonate-Vitamin D (CALCIUM 600+D) 600-400 MG-UNIT per tablet Take 1 tablet by mouth daily.  . DORZOLAMIDE HCL-TIMOLOL MAL OP Apply 1 drop to eye 2 (two) times daily.  Marland Kitchen. latanoprost (XALATAN) 0.005 % ophthalmic solution Place 1 drop into the left eye at bedtime.  . Misc Natural Products (OSTEO BI-FLEX TRIPLE STRENGTH) TABS Take 4,000 each by mouth daily. Patient takes 2000 mg(2 tablets at one time daily)  . Multiple Vitamins-Minerals ([employer] SPECTRAVITE PO) Take 1 capsule by mouth daily.  . Omega-3 Fatty Acids (FISH OIL) 1200 MG CAPS Take 1,200 mg by mouth 3 (three) times daily.  . potassium chloride SA (K-DUR,KLOR-CON) 20 MEQ tablet TAKE TWO TABLETS BY MOUTH DAILY  . [DISCONTINUED] Multiple Vitamin (MULTIVITAMIN) tablet Take 1 tablet by mouth daily.  . [DISCONTINUED] timolol (TIMOPTIC) 0.5 % ophthalmic solution    No facility-administered  encounter medications on file as of 06/29/2016.     Allergies (verified) Patient has no known allergies.   History: Past Medical History:  Diagnosis Date  . Ankle fracture, left   . Diabetes mellitus without complication ([name])   . Generalized osteoarthrosis, involving multiple sites   . Glaucoma   . Hepatitis, unspecified   . Hypertension   . Hypopotassemia   . Osteoporosis    Past Surgical History:  Procedure Laterality Date  . FRACTURE SURGERY Left       . REFRACTIVE SURGERY     Family History  Problem Relation Age of Onset  . Stroke Father [id number]  . Diabetes Sister   . Stroke Mother [id number]  . Diabetes Mother   . Stroke Brother   . Hypertension Sister   . Hyperlipidemia Sister   . Cancer Brother     LUNG  . Diabetes Brother    Social History   Occupational History  . Not on file.   Social History Main Topics  . Smoking status: Never Smoker  . Smokeless tobacco: Never Used  . Alcohol use No  . Drug use: No  . Sexual activity: Yes    Tobacco Counseling No tobacco use  Activities of Daily Living In your present state of health, do you have any difficulty performing the following activities: 06/29/2016  Hearing? Y  Vision? N  Difficulty concentrating or making decisions? N  Walking or climbing stairs? N  Dressing or bathing? N  Doing  errands, shopping? Y  Preparing Food and eating ? N  Using the Toilet? N  In the past six months, have you accidently leaked urine? (No Data)  Do you have problems with loss of bowel control? N  Managing your Medications? N  Managing your Finances? N  Housekeeping or managing your Housekeeping? N  Some recent data might be hidden   Patient reports no difficulty with hearing but daughter states otherwise. Patient does no want an audiology referral at this time.   Immunizations and Health Maintenance Immunization History  Administered Date(s) Administered  . Influenza, High Dose Seasonal PF 04/26/2015  . Influenza,inj,Quad  PF,36+ Mos 04/02/2013  . Influenza,inj,quad, With Preservative 03/31/2014  . Pneumococcal Conjugate-13 01/01/2015  . Pneumococcal Polysaccharide-23 04/26/2009  . Td 03/27/2007  . Zoster 03/10/2013   Health Maintenance Due  Topic Date Due  . OPHTHALMOLOGY EXAM  12/02/2014    Patient Care Team: Bennie Pierini, FNP as PCP - General (Nurse Practitioner) Thana Farr, MD-opthalmologist      Assessment:   This is a routine wellness examination for [name].   Hearing/Vision screen Some decreased hearing noted during visit. No vision deficits noted.   Dietary issues and exercise activities discussed: Current Exercise Habits: The patient does not participate in regular exercise at present, Exercise limited by: Other - see comments (Balance)   Reports eating 2-3 meals per day. She prepares some meals and eats canned soup at other times. She is careful to buy low sodium soup.   Goals    . Exercise 3x per week (30 min per time)          Walk 3 times a week. Start out with 10 minutes a day and increase as tolerated.  Do chair exercises daily.       Depression Screen PHQ 2/9 Scores 06/29/2016 06/20/2016 03/09/2016 12/03/2015 08/26/2015 05/27/2015 01/01/2015  PHQ - 2 Score 0 0 0 0 0 0 0  PHQ- 9 Score - - - - - - -    Fall Risk Fall Risk  06/29/2016 06/20/2016 03/09/2016 12/03/2015 08/26/2015  Falls in the past year? No No No No No    Cognitive Function: MMSE - Mini Mental State Exam 06/29/2016  Orientation to time 5  Orientation to Place 5  Registration 3  Attention/ Calculation 4  Recall 2  Language- name 2 objects 2  Language- repeat 1  Language- follow 3 step command 3  Language- read & follow direction 1  Write a sentence 1  Copy design 1  Total score 28      Very normal exam.  Screening Tests Health Maintenance  Topic Date Due  . OPHTHALMOLOGY EXAM  12/02/2014  . MAMMOGRAM  12/02/2016 (Originally 01/20/2015)  . HEMOGLOBIN A1C  12/19/2016  . FOOT EXAM  03/09/2017  .  TETANUS/TDAP  03/26/2017  . INFLUENZA VACCINE  Completed  . DEXA SCAN  Completed  . ZOSTAVAX  Completed  . PNA vac Low Risk Adult  Completed      Plan:   Keep f/u with [name] in March 2018.  During the course of the visit, Ife was educated and counseled about the following appropriate screening and preventive services:   Vaccines to include Pneumoccal-up to date, Influenza-up to date, Td-up to date, Zostavax-up to date  Bone density screening-last in 2015. No additional neede  Glaucoma screening-last week with Dr Dione Booze. Will request report  Mammography/PAP-not indicated  Nutrition counseling  Patient Instructions (the written plan) were given to the patient.  Demetrios Loll, RN  06/29/2016   I have reviewed and agree with the above AWV documentation.   Mary-Margaret Daphine Deutscher, FNP

## 2016-06-29 NOTE — Patient Instructions (Signed)
  Ms. Serita ShellerBlackstock ,  Thank you for taking time to come for your Medicare Wellness Visit. I appreciate your ongoing commitment to your health goals. Please review the following plan we discussed and let me know if I can assist you in the future.   These are the goals we discussed: Goals    . Exercise 3x per week (30 min per time)          Walk 3 times a week. Start out with 10 minutes a day and increase as tolerated.  Do chair exercises daily.       See sheet for instrucitons on chair exercises.   This is a list of the screening recommended for you and due dates:  Health Maintenance  Topic Date Due  . Eye exam for diabetics  12/02/2014  . Mammogram  12/02/2016*  . Hemoglobin A1C  12/19/2016  . Complete foot exam   03/09/2017  . Tetanus Vaccine  03/26/2017  . Flu Shot  Completed  . DEXA scan (bone density measurement)  Completed  . Shingles Vaccine  Completed  . Pneumonia vaccines  Completed  *Topic was postponed. The date shown is not the original due date.

## 2016-08-10 ENCOUNTER — Telehealth: Payer: Self-pay | Admitting: Nurse Practitioner

## 2016-08-10 NOTE — Telephone Encounter (Signed)
Pt questioning whether she should be taking low dose ASA Explained to pt this would be beneficial Pt does not want to take at this time

## 2016-09-19 ENCOUNTER — Ambulatory Visit (INDEPENDENT_AMBULATORY_CARE_PROVIDER_SITE_OTHER): Payer: Medicare Other | Admitting: Nurse Practitioner

## 2016-09-19 ENCOUNTER — Encounter: Payer: Self-pay | Admitting: Nurse Practitioner

## 2016-09-19 VITALS — BP 114/62 | HR 55 | Temp 96.8°F | Ht 61.0 in | Wt 139.0 lb

## 2016-09-19 DIAGNOSIS — I10 Essential (primary) hypertension: Secondary | ICD-10-CM

## 2016-09-19 DIAGNOSIS — K759 Inflammatory liver disease, unspecified: Secondary | ICD-10-CM

## 2016-09-19 DIAGNOSIS — Z6828 Body mass index (BMI) 28.0-28.9, adult: Secondary | ICD-10-CM

## 2016-09-19 DIAGNOSIS — H409 Unspecified glaucoma: Secondary | ICD-10-CM

## 2016-09-19 DIAGNOSIS — E785 Hyperlipidemia, unspecified: Secondary | ICD-10-CM | POA: Diagnosis not present

## 2016-09-19 DIAGNOSIS — M8000XD Age-related osteoporosis with current pathological fracture, unspecified site, subsequent encounter for fracture with routine healing: Secondary | ICD-10-CM

## 2016-09-19 DIAGNOSIS — M47896 Other spondylosis, lumbar region: Secondary | ICD-10-CM | POA: Diagnosis not present

## 2016-09-19 DIAGNOSIS — E876 Hypokalemia: Secondary | ICD-10-CM | POA: Diagnosis not present

## 2016-09-19 DIAGNOSIS — E119 Type 2 diabetes mellitus without complications: Secondary | ICD-10-CM

## 2016-09-19 LAB — BAYER DCA HB A1C WAIVED: HB A1C: 5.8 % (ref <7.0)

## 2016-09-19 LAB — LIPID PANEL
Chol/HDL Ratio: 2.6 ratio (ref 0.0–4.4)
Cholesterol, Total: 147 mg/dL (ref 100–199)
HDL: 57 mg/dL
LDL Calculated: 74 mg/dL (ref 0–99)
Triglycerides: 80 mg/dL (ref 0–149)
VLDL Cholesterol Cal: 16 mg/dL (ref 5–40)

## 2016-09-19 LAB — CMP14+EGFR
A/G RATIO: 1.1 — AB (ref 1.2–2.2)
ALT: 24 IU/L (ref 0–32)
AST: 30 IU/L (ref 0–40)
Albumin: 4.2 g/dL (ref 3.5–4.7)
Alkaline Phosphatase: 63 IU/L (ref 39–117)
BUN/Creatinine Ratio: 14 (ref 12–28)
BUN: 9 mg/dL (ref 8–27)
Bilirubin Total: 0.4 mg/dL (ref 0.0–1.2)
CO2: 25 mmol/L (ref 18–29)
CREATININE: 0.65 mg/dL (ref 0.57–1.00)
Calcium: 9.8 mg/dL (ref 8.7–10.3)
Chloride: 101 mmol/L (ref 96–106)
GFR calc Af Amer: 95 mL/min/{1.73_m2} (ref >59)
GFR calc non Af Amer: 82 mL/min/{1.73_m2} (ref >59)
Globulin, Total: 3.7 g/dL (ref 1.5–4.5)
Glucose: 107 mg/dL — ABNORMAL HIGH (ref 65–99)
POTASSIUM: 3.9 mmol/L (ref 3.5–5.2)
Sodium: 142 mmol/L (ref 134–144)
Total Protein: 7.9 g/dL (ref 6.0–8.5)

## 2016-09-19 NOTE — Progress Notes (Signed)
Subjective:    Patient ID: Christina Delgado, female    DOB: 12-Apr-1931, 83 y.o.   MRN: [id number]  Patient here today for follow up of chronic medical problems. No changes since last visit. No complaints today. Had cough over the holidays but has since resolved.  Outpatient Encounter Prescriptions as of 09/19/2016  Medication Sig  . amLODipine-benazepril (LOTREL) 5-10 MG capsule Take 1 capsule by mouth daily.  . brimonidine (ALPHAGAN) 0.15 % ophthalmic solution Place 1 drop into both eyes 2 (two) times daily.  . Calcium Carbonate-Vitamin D (CALCIUM 600+D) 600-400 MG-UNIT per tablet Take 1 tablet by mouth daily.  . DORZOLAMIDE HCL-TIMOLOL MAL OP Apply 1 drop to eye 2 (two) times daily.  Marland Kitchen latanoprost (XALATAN) 0.005 % ophthalmic solution Place 1 drop into the left eye at bedtime.  . Misc Natural Products (OSTEO BI-FLEX TRIPLE STRENGTH) TABS Take 4,000 each by mouth daily. Patient takes 2000 mg(2 tablets at one time daily)  . Multiple Vitamins-Minerals ([employer] SPECTRAVITE PO) Take 1 capsule by mouth daily.  . Omega-3 Fatty Acids (FISH OIL) 1200 MG CAPS Take 1,200 mg by mouth 3 (three) times daily.  . potassium chloride SA (K-DUR,KLOR-CON) 20 MEQ tablet TAKE TWO TABLETS BY MOUTH DAILY   No facility-administered encounter medications on file as of 09/19/2016.     Hypertension  This is a chronic problem. The current episode started more than 1 year ago. The problem is controlled. Pertinent negatives include no chest pain, headaches, palpitations or shortness of breath. Risk factors for coronary artery disease include dyslipidemia, diabetes mellitus, family history, obesity and post-menopausal state. Past treatments include ACE inhibitors and calcium channel blockers. The current treatment provides moderate improvement.  Hyperlipidemia  This is a chronic problem. The current episode started more than 1 year ago. The problem is controlled. Recent lipid tests were reviewed and are normal. Exacerbating  diseases include diabetes and obesity. She has no history of hypothyroidism. Pertinent negatives include no chest pain or shortness of breath. She is currently on no antihyperlipidemic treatment (makes her hurt and is to expensive.). The current treatment provides moderate improvement of lipids. Risk factors for coronary artery disease include diabetes mellitus, dyslipidemia, hypertension, obesity and post-menopausal.  Diabetes  She presents for her follow-up diabetic visit. She has type 2 diabetes mellitus. No MedicAlert identification noted. Pertinent negatives for hypoglycemia include no dizziness or headaches. Pertinent negatives for diabetes include no chest pain, no polydipsia, no polyphagia, no polyuria and no visual change. Risk factors for coronary artery disease include diabetes mellitus, dyslipidemia, obesity, hypertension and post-menopausal. Current diabetic treatment includes diet. When asked about meal planning, she reported none. She has not had a previous visit with a dietitian. She rarely participates in exercise. Home blood sugar record trend: Patient does not check blood sugars at home. An ACE inhibitor/angiotensin II receptor blocker is being taken. She does not see a podiatrist.Eye exam is current.  hypokalemia K-Dur daily- No C/O lower ext cramping DJD/osteoporosis Currently not taking anything. No c/o back pain today- says only has occasionally. Glaucoma Patient sees eye doctor every 6 months   Review of Systems  Constitutional: Negative.   Respiratory: Negative.  Negative for shortness of breath.   Cardiovascular: Negative for chest pain and palpitations.  Gastrointestinal: Negative for constipation and diarrhea.  Endocrine: Negative for polydipsia, polyphagia and polyuria.  Neurological: Negative for dizziness and headaches.  Psychiatric/Behavioral: Negative.  Negative for sleep disturbance.  All other systems reviewed and are negative.      Objective:  Physical  Exam  Constitutional: She is oriented to person, place, and time. She appears well-developed and well-nourished.  HENT:  Nose: Nose normal.  Mouth/Throat: Oropharynx is clear and moist.  Eyes: EOM are normal.  Neck: Trachea normal, normal range of motion and full passive range of motion without pain. Neck supple. No JVD present. Carotid bruit is not present. No thyromegaly present.  Cardiovascular: Normal rate, regular rhythm, normal heart sounds and intact distal pulses.  Exam reveals no gallop and no friction rub.   No murmur heard. Pulmonary/Chest: Effort normal and breath sounds normal.  Abdominal: Soft. Bowel sounds are normal. She exhibits no distension and no mass. There is no tenderness.  Musculoskeletal: Normal range of motion.  compression hose in place  Lymphadenopathy:    She has no cervical adenopathy.  Neurological: She is alert and oriented to person, place, and time. She has normal reflexes.  Skin: Skin is warm and dry.  Psychiatric: She has a normal mood and affect. Her behavior is normal. Judgment and thought content normal.     BP 114/62   Pulse (!) 55   Temp (!) 96.8 F (36 C) (Oral)   Ht _0  (1.549 m)   Wt 139 lb (63 kg)   BMI 26.26 kg/m    HGBA1c 5.8%    Assessment & Plan:  1. Hyperlipidemia with target LDL less than 100 Low fat diet - Lipid panel  2. Type 2 diabetes mellitus without complication, without long-term current use of insulin ([name]) continue to watch carbs in diet - Bayer DCA Hb A1c Waived - Microalbumin / creatinine urine ratio  3. Essential hypertension Low sodium diet - CMP14+EGFR  4. Hepatitis  5. Other osteoarthritis of spine, lumbar region Weight bearing exercise wil get dexa at next appointment  6. BMI 28.0-28.9,adult Discussed diet and exercise for person with BMI >25 Will recheck weight in 3-6 months  7. Glaucoma, unspecified glaucoma type, unspecified laterality Keep follow up with eye doctor  8.  Hypokalemia  9. Pathological fracture due to osteoporosis with routine healing, unspecified fracture site, unspecified osteoporosis type, subsequent encounter Fall precautions    Labs pending Health maintenance reviewed Diet and exercise encouraged Continue all meds Follow up  In 3 months   [place], FNP

## 2016-09-20 LAB — MICROALBUMIN / CREATININE URINE RATIO
Creatinine, Urine: 71.1 mg/dL
MICROALB/CREAT RATIO: 8.9 mg/g{creat} (ref 0.0–30.0)
Microalbumin, Urine: 6.3 ug/mL

## 2016-11-21 ENCOUNTER — Other Ambulatory Visit: Payer: Self-pay | Admitting: Nurse Practitioner

## 2016-11-21 DIAGNOSIS — E876 Hypokalemia: Secondary | ICD-10-CM

## 2016-12-21 ENCOUNTER — Other Ambulatory Visit: Payer: Self-pay | Admitting: Nurse Practitioner

## 2016-12-21 ENCOUNTER — Encounter: Payer: Self-pay | Admitting: Nurse Practitioner

## 2016-12-21 ENCOUNTER — Ambulatory Visit (INDEPENDENT_AMBULATORY_CARE_PROVIDER_SITE_OTHER): Payer: Medicare Other | Admitting: Nurse Practitioner

## 2016-12-21 VITALS — BP 142/70 | HR 72 | Temp 97.2°F

## 2016-12-21 DIAGNOSIS — E876 Hypokalemia: Secondary | ICD-10-CM

## 2016-12-21 DIAGNOSIS — E119 Type 2 diabetes mellitus without complications: Secondary | ICD-10-CM

## 2016-12-21 DIAGNOSIS — I10 Essential (primary) hypertension: Secondary | ICD-10-CM

## 2016-12-21 DIAGNOSIS — M8000XD Age-related osteoporosis with current pathological fracture, unspecified site, subsequent encounter for fracture with routine healing: Secondary | ICD-10-CM

## 2016-12-21 DIAGNOSIS — Z6828 Body mass index (BMI) 28.0-28.9, adult: Secondary | ICD-10-CM | POA: Diagnosis not present

## 2016-12-21 DIAGNOSIS — E785 Hyperlipidemia, unspecified: Secondary | ICD-10-CM | POA: Diagnosis not present

## 2016-12-21 LAB — BAYER DCA HB A1C WAIVED: HB A1C (BAYER DCA - WAIVED): 5.3 % (ref <7.0)

## 2016-12-21 NOTE — Progress Notes (Signed)
Subjective:    Patient ID: ADAISHA Delgado, female    DOB: 03/20/1931, 83 y.o.   MRN: [id number]  HPI  Christina Delgado is here today for follow up of chronic medical problem.  Outpatient Encounter Prescriptions as of 12/21/2016  Medication Sig  . amLODipine-benazepril (LOTREL) 5-10 MG capsule Take 1 Capsule by mouth once daily  . brimonidine (ALPHAGAN) 0.15 % ophthalmic solution Place 1 drop into both eyes 2 (two) times daily.  . Calcium Carbonate-Vitamin D (CALCIUM 600+D) 600-400 MG-UNIT per tablet Take 1 tablet by mouth daily.  . DORZOLAMIDE HCL-TIMOLOL MAL OP Apply 1 drop to eye 2 (two) times daily.  Marland Kitchen latanoprost (XALATAN) 0.005 % ophthalmic solution Place 1 drop into the left eye at bedtime.  . Misc Natural Products (OSTEO BI-FLEX TRIPLE STRENGTH) TABS Take 4,000 each by mouth daily. Patient takes 2000 mg(2 tablets at one time daily)  . Multiple Vitamins-Minerals ([employer] SPECTRAVITE PO) Take 1 capsule by mouth daily.  . Omega-3 Fatty Acids (FISH OIL) 1200 MG CAPS Take 1,200 mg by mouth 3 (three) times daily.  . potassium chloride SA (K-DUR,KLOR-CON) 20 MEQ tablet Take 2 Tablets by mouth once daily   No facility-administered encounter medications on file as of 12/21/2016.     1. Hyperlipidemia with target LDL less than 100  Managed with omega-3 fatty acid supplement.  2. Type 2 diabetes mellitus without complication, without long-term current use of insulin ([place])  Patient managed with diet and exercise.  3. Essential hypertension  Managed with combination amlodipine-benazepril.  Patient check blood pressure  4. Hypokalemia  Potassium supplement taken daily.  5. BMI 28.0-28.9,adult  No recent weight gain or loss.  6. Pathological fracture due to osteoporosis with routine healing, unspecified fracture site, unspecified osteoporosis type, subsequent encounter  Patient taking calcium supplement.    New complaints: None today.     Review of Systems  Constitutional: Negative  for activity change, appetite change and fatigue.  Respiratory: Negative for cough, shortness of breath and wheezing.   Cardiovascular: Negative for chest pain and palpitations.  Gastrointestinal: Negative for abdominal distention and abdominal pain.  Neurological: Negative for dizziness and headaches.  All other systems reviewed and are negative.      Objective:   Physical Exam  Constitutional: She is oriented to person, place, and time. She appears well-developed and well-nourished. No distress.  HENT:  Head: Normocephalic.  Right Ear: External ear normal.  Left Ear: External ear normal.  Mouth/Throat: Oropharynx is clear and moist.  Eyes: Pupils are equal, round, and reactive to light.  Neck: Normal range of motion. Neck supple. No JVD present. No thyromegaly present.  Cardiovascular: Normal rate, regular rhythm, normal heart sounds and intact distal pulses.   No murmur heard. Pulmonary/Chest: Effort normal and breath sounds normal. No respiratory distress.  Abdominal: Soft. Bowel sounds are normal. She exhibits no distension. There is no tenderness.  Musculoskeletal: Normal range of motion.  Lymphadenopathy:    She has no cervical adenopathy.  Neurological: She is alert and oriented to person, place, and time.  Skin: Skin is warm and dry.  2cm raised sebaceous cyst on right lower neck  Psychiatric: She has a normal mood and affect. Her behavior is normal. Judgment and thought content normal.   BP (!) 142/70   Pulse 72   Temp 97.2 F (36.2 C) (Oral)   SpO2 100%   Hgba1c 5.3%    Assessment & Plan:  1. Hyperlipidemia with target LDL less than 100 Low fat  diet - Lipid panel  2. Type 2 diabetes mellitus without complication, without long-term current use of insulin ([name]) Continue to watch carbsin diet - Bayer DCA Hb A1c Waived  3. Essential hypertension Low sodium diet - CMP14+EGFR  4. Hypokalemia  5. BMI 28.0-28.9,adult Discussed diet and exercise for person  with BMI >25 Will recheck weight in 3-6 months  6. Pathological fracture due to osteoporosis with routine healing, unspecified fracture site, unspecified osteoporosis type, subsequent encounter Fall prevention discussed    Labs pending Health maintenance reviewed Diet and exercise encouraged Continue all meds Follow up  In 3 months   [place], FNP

## 2016-12-21 NOTE — Patient Instructions (Signed)

## 2016-12-22 LAB — CMP14+EGFR
ALBUMIN: 4.1 g/dL (ref 3.5–4.7)
ALT: 27 IU/L (ref 0–32)
AST: 33 IU/L (ref 0–40)
Albumin/Globulin Ratio: 1.2 (ref 1.2–2.2)
Alkaline Phosphatase: 59 IU/L (ref 39–117)
BILIRUBIN TOTAL: 0.5 mg/dL (ref 0.0–1.2)
BUN/Creatinine Ratio: 14 (ref 12–28)
BUN: 9 mg/dL (ref 8–27)
CHLORIDE: 103 mmol/L (ref 96–106)
CO2: 24 mmol/L (ref 20–29)
CREATININE: 0.65 mg/dL (ref 0.57–1.00)
Calcium: 9.3 mg/dL (ref 8.7–10.3)
GFR calc Af Amer: 94 mL/min/{1.73_m2} (ref >59)
GFR calc non Af Amer: 82 mL/min/{1.73_m2} (ref >59)
GLOBULIN, TOTAL: 3.5 g/dL (ref 1.5–4.5)
GLUCOSE: 93 mg/dL (ref 65–99)
Potassium: 3.5 mmol/L (ref 3.5–5.2)
Sodium: 142 mmol/L (ref 134–144)
Total Protein: 7.6 g/dL (ref 6.0–8.5)

## 2016-12-22 LAB — LIPID PANEL
Chol/HDL Ratio: 2.3 ratio (ref 0.0–4.4)
Cholesterol, Total: 135 mg/dL (ref 100–199)
HDL: 59 mg/dL (ref >39)
LDL Calculated: 62 mg/dL (ref 0–99)
TRIGLYCERIDES: 72 mg/dL (ref 0–149)
VLDL Cholesterol Cal: 14 mg/dL (ref 5–40)

## 2017-02-01 ENCOUNTER — Encounter: Payer: Self-pay | Admitting: *Deleted

## 2017-03-20 ENCOUNTER — Ambulatory Visit (INDEPENDENT_AMBULATORY_CARE_PROVIDER_SITE_OTHER): Payer: Medicare Other | Admitting: Nurse Practitioner

## 2017-03-20 ENCOUNTER — Encounter: Payer: Self-pay | Admitting: Nurse Practitioner

## 2017-03-20 VITALS — BP 157/78 | HR 53 | Temp 96.3°F | Ht 61.0 in | Wt 133.2 lb

## 2017-03-20 DIAGNOSIS — E785 Hyperlipidemia, unspecified: Secondary | ICD-10-CM

## 2017-03-20 DIAGNOSIS — M47896 Other spondylosis, lumbar region: Secondary | ICD-10-CM | POA: Diagnosis not present

## 2017-03-20 DIAGNOSIS — E876 Hypokalemia: Secondary | ICD-10-CM

## 2017-03-20 DIAGNOSIS — I10 Essential (primary) hypertension: Secondary | ICD-10-CM | POA: Diagnosis not present

## 2017-03-20 DIAGNOSIS — E119 Type 2 diabetes mellitus without complications: Secondary | ICD-10-CM

## 2017-03-20 DIAGNOSIS — R634 Abnormal weight loss: Secondary | ICD-10-CM | POA: Diagnosis not present

## 2017-03-20 DIAGNOSIS — Z6825 Body mass index (BMI) 25.0-25.9, adult: Secondary | ICD-10-CM

## 2017-03-20 LAB — BAYER DCA HB A1C WAIVED: HB A1C (BAYER DCA - WAIVED): 5.7 %

## 2017-03-20 MED ORDER — AMLODIPINE BESY-BENAZEPRIL HCL 5-10 MG PO CAPS: 1.0000 | ORAL_CAPSULE | Freq: Every day | ORAL | 5 refills | Status: DC

## 2017-03-20 MED ORDER — POTASSIUM CHLORIDE CRYS ER 20 MEQ PO TBCR: 40.0000 meq | EXTENDED_RELEASE_TABLET | Freq: Every day | ORAL | 5 refills | Status: DC

## 2017-03-20 NOTE — Progress Notes (Signed)
Subjective:    Patient ID: Christina Delgado, female    DOB: 1930/10/08, 83 y.o.   MRN: [id number]  HPI ALANE HANSSEN is here today for follow up of chronic medical problem.  Outpatient Encounter Prescriptions as of 03/20/2017  Medication Sig  . amLODipine-benazepril (LOTREL) 5-10 MG capsule Take 1 Capsule by mouth once daily  . brimonidine (ALPHAGAN) 0.15 % ophthalmic solution Place 1 drop into both eyes 2 (two) times daily.  . Calcium Carbonate-Vitamin D (CALCIUM 600+D) 600-400 MG-UNIT per tablet Take 1 tablet by mouth daily.  . DORZOLAMIDE HCL-TIMOLOL MAL OP Apply 1 drop to eye 2 (two) times daily.  Marland Kitchen latanoprost (XALATAN) 0.005 % ophthalmic solution Place 1 drop into the left eye at bedtime.  . Misc Natural Products (OSTEO BI-FLEX TRIPLE STRENGTH) TABS Take 4,000 each by mouth daily. Patient takes 2000 mg(2 tablets at one time daily)  . Multiple Vitamins-Minerals ([employer] SPECTRAVITE PO) Take 1 capsule by mouth daily.  . Omega-3 Fatty Acids (FISH OIL) 1200 MG CAPS Take 1,200 mg by mouth 3 (three) times daily.  . potassium chloride SA (K-DUR,KLOR-CON) 20 MEQ tablet Take 2 Tablets by mouth once daily   No facility-administered encounter medications on file as of 03/20/2017.     1. Hyperlipidemia with target LDL less than 100  Managed with OTC fish oil and regular lab monitoring.  2. Type 2 diabetes mellitus without complication, without long-term current use of insulin ([place])  Blood glucose managed with diet at this point.  Previous A1C 5.3 on 12/21/16.  Patient does not check blood sugar at home.  3. Essential hypertension  Blood pressure well-controlled with Lotrel daily.  Patient checks her BP at home and it is typically 110s/60s.  4. Other osteoarthritis of spine, lumbar region  Patient manages with natural supplement.  5. Hypokalemia  Potassium maintained with daily supplement.  No chest pain or SOB.  6. BMI 28.0-28.9,adult  No significant weight gain or loss.    New  complaints: None today.  Social history: Daughter concerned about her weight loss- has not been eating good since her husband passed away   Review of Systems  Constitutional: Positive for appetite change (not eating as much since husband passed away). Negative for activity change and fatigue.  Respiratory: Negative for cough and shortness of breath.   Cardiovascular: Negative for chest pain and palpitations.  Neurological: Positive for dizziness (associated with eye drops). Negative for light-headedness and numbness.  All other systems reviewed and are negative.      Objective:   Physical Exam  Constitutional: She is oriented to person, place, and time. She appears well-developed and well-nourished. No distress.  HENT:  Head: Normocephalic.  Right Ear: External ear normal.  Left Ear: External ear normal.  Mouth/Throat: Oropharynx is clear and moist.  Eyes: Pupils are equal, round, and reactive to light.  Neck: Normal range of motion. Neck supple. No thyromegaly present.  Cardiovascular: Normal rate, regular rhythm, normal heart sounds and intact distal pulses.   No murmur heard. Pulmonary/Chest: Effort normal and breath sounds normal. No respiratory distress. She has no wheezes.  Abdominal: Soft. Bowel sounds are normal. She exhibits no distension. There is no tenderness.  Musculoskeletal: Normal range of motion. She exhibits no edema.  Lymphadenopathy:    She has no cervical adenopathy.  Neurological: She is alert and oriented to person, place, and time.  Skin: Skin is warm and dry.  Psychiatric: She has a normal mood and affect. Her behavior is normal.  BP (!) 157/78   Pulse (!) 53   Temp (!) 96.3 F (35.7 C) (Oral)   Ht '5\' 1"'  (1.549 m)   Wt 133 lb 3.2 oz (60.4 kg)   BMI 25.17 kg/m  A1C: 5.7     Assessment & Plan:  1. Hyperlipidemia with target LDL less than 100 Low fat diet - Lipid panel  2. Type 2 diabetes mellitus without complication, without long-term  current use of insulin ([name]) Continue to watch carbsin diet - Bayer DCA Hb A1c Waived  3. Essential hypertension Low sodium diet - CMP14+EGFR - amLODipine-benazepril (LOTREL) 5-10 MG capsule; Take 1 capsule by mouth daily.  Dispense: 30 capsule; Refill: 5  4. Other osteoarthritis of spine, lumbar region  5. Hypokalemia - potassium chloride SA (K-DUR,KLOR-CON) 20 MEQ tablet; Take 2 tablets (40 mEq total) by mouth daily.  Dispense: 60 tablet; Refill: 5  6. BMI 25.0-25.9,adult Discussed diet and exercise for person with BMI >25 Will recheck weight in 3-6 months  7. Weight loss Increase diet-  RTO in 1 month if weight is going down will add appetite enhancer    Labs pending Health maintenance reviewed Diet and exercise encouraged Continue all meds Follow up  In 1 month   [place], FNP

## 2017-03-20 NOTE — Patient Instructions (Signed)
Serving Sizes  A serving size is a measured amount of food or drink, such as one slice of bread, that has an associated nutrient content. Knowing the serving size of a food or drink can help you determine how much of that food you should consume.  What is the size of one serving?  The size of one healthy serving depends on the food or drink. To determine a serving size, read the food label. If the food or drink does not have a food label, try to find serving size information online. Or, use the following to estimate the size of one adult serving:  Grain  1 slice bread.  bagel.  cup pasta.  Vegetable   cup cooked or canned vegetables. 1 cup raw, leafy greens.  Fruit   cup canned fruit. 1 medium fruit.  cup dried fruit.  Meat and Other Protein Sources  1 oz meat, poultry, or fish.  cup cooked beans. 1 egg.  cup nuts or seeds. 1 Tbsp nut butter.  cup tofu or tempeh. 2 Tbsp hummus.  Dairy  An individual container of yogurt (6-8 oz). 1 piece of cheese the size of your thumb (1 oz). 1 cup (8 oz) milk or milk alternative.  Fat  A piece the size of one dice. 1 tsp soft margarine. 1 Tbsp mayonnaise. 1 tsp vegetable oil. 1 Tbsp regular salad dressing. 2 Tbsp low-fat salad dressing.  How many servings should I eat from each food group each day?  The following are the suggested number of servings to try and have every day from each food group. You can also look at your eating throughout the week and aim for meeting these requirements on most days for overall healthy eating.  Grain  6-8 servings. Try to have half of your grains from whole grains, such as whole wheat bread, corn tortillas, oatmeal, brown rice, whole wheat pasta, and bulgur.  Vegetable  At least 2-3 servings.  Fruit  2 servings.  Meat and Other Protein Foods  5-6 servings. Aim to have lean proteins, such as chicken, turkey, fish, beans, or tofu.  Dairy  3 servings. Choose low-fat or nonfat if you are trying to control your weight.  Fat  2-3  servings.  Is a serving the same thing as a portion?  No. A portion is the actual amount you eat, which may be more than one serving. Knowing the specific serving size of a food and the nutritional information that goes with it can help you make a healthy decision on what size portion to eat.  What are some tips to help me learn healthy serving sizes?  Check food labels for serving sizes. Many foods that come as a single portion actually contain multiple servings.  Determine the serving size of foods you commonly eat and figure out how large a portion you usually eat.  Measure the number of servings that can be held by the bowls, glasses, cups, and plates you typically use. For example, pour your breakfast cereal into your regular bowl and then pour it into a measuring cup.  For 1-2 days, measure the serving sizes of all the foods you eat.  Practice estimating serving sizes and determining how big your portions should be.  This information is not intended to replace advice given to you by your health care provider. Make sure you discuss any questions you have with your health care provider.  Document Released: 03/11/2003 Document Revised: 02/05/2016 Document Reviewed: 09/09/2013  Elsevier Interactive Patient   Education  2018 [employer]

## 2017-03-21 LAB — CMP14+EGFR
ALK PHOS: 60 IU/L (ref 39–117)
ALT: 19 IU/L (ref 0–32)
AST: 26 IU/L (ref 0–40)
Albumin/Globulin Ratio: 1.1 — ABNORMAL LOW (ref 1.2–2.2)
Albumin: 4.4 g/dL (ref 3.5–4.7)
BUN/Creatinine Ratio: 12 (ref 12–28)
BUN: 7 mg/dL — ABNORMAL LOW (ref 8–27)
Bilirubin Total: 0.4 mg/dL (ref 0.0–1.2)
CO2: 26 mmol/L (ref 20–29)
CREATININE: 0.59 mg/dL (ref 0.57–1.00)
Calcium: 9.8 mg/dL (ref 8.7–10.3)
Chloride: 100 mmol/L (ref 96–106)
GFR calc Af Amer: 97 mL/min/{1.73_m2} (ref >59)
GFR calc non Af Amer: 84 mL/min/{1.73_m2} (ref >59)
GLUCOSE: 101 mg/dL — AB (ref 65–99)
Globulin, Total: 3.9 g/dL (ref 1.5–4.5)
Potassium: 3.6 mmol/L (ref 3.5–5.2)
SODIUM: 142 mmol/L (ref 134–144)
Total Protein: 8.3 g/dL (ref 6.0–8.5)

## 2017-03-21 LAB — LIPID PANEL
CHOL/HDL RATIO: 2.6 ratio (ref 0.0–4.4)
Cholesterol, Total: 140 mg/dL (ref 100–199)
HDL: 54 mg/dL (ref >39)
LDL Calculated: 72 mg/dL (ref 0–99)
TRIGLYCERIDES: 71 mg/dL (ref 0–149)
VLDL Cholesterol Cal: 14 mg/dL (ref 5–40)

## 2017-03-23 ENCOUNTER — Ambulatory Visit: Payer: Medicare Other | Admitting: Nurse Practitioner

## 2017-06-22 ENCOUNTER — Ambulatory Visit (INDEPENDENT_AMBULATORY_CARE_PROVIDER_SITE_OTHER): Payer: Medicare Other

## 2017-06-22 ENCOUNTER — Ambulatory Visit: Payer: Medicare Other | Admitting: Nurse Practitioner

## 2017-06-22 ENCOUNTER — Encounter: Payer: Self-pay | Admitting: Nurse Practitioner

## 2017-06-22 VITALS — BP 98/56 | HR 56 | Temp 96.9°F | Ht 61.0 in | Wt 132.0 lb

## 2017-06-22 DIAGNOSIS — R634 Abnormal weight loss: Secondary | ICD-10-CM

## 2017-06-22 DIAGNOSIS — M8000XD Age-related osteoporosis with current pathological fracture, unspecified site, subsequent encounter for fracture with routine healing: Secondary | ICD-10-CM | POA: Diagnosis not present

## 2017-06-22 DIAGNOSIS — I10 Essential (primary) hypertension: Secondary | ICD-10-CM

## 2017-06-22 DIAGNOSIS — E119 Type 2 diabetes mellitus without complications: Secondary | ICD-10-CM

## 2017-06-22 DIAGNOSIS — Z6824 Body mass index (BMI) 24.0-24.9, adult: Secondary | ICD-10-CM | POA: Diagnosis not present

## 2017-06-22 DIAGNOSIS — E876 Hypokalemia: Secondary | ICD-10-CM | POA: Diagnosis not present

## 2017-06-22 DIAGNOSIS — E785 Hyperlipidemia, unspecified: Secondary | ICD-10-CM

## 2017-06-22 LAB — BAYER DCA HB A1C WAIVED: HB A1C: 5.7 % (ref <7.0)

## 2017-06-22 MED ORDER — MEGESTROL ACETATE 20 MG PO TABS: 20.0000 mg | ORAL_TABLET | Freq: Every day | ORAL | 5 refills | Status: DC

## 2017-06-22 MED ORDER — POTASSIUM CHLORIDE CRYS ER 20 MEQ PO TBCR: 40.0000 meq | EXTENDED_RELEASE_TABLET | Freq: Every day | ORAL | 5 refills | Status: DC

## 2017-06-22 MED ORDER — AMLODIPINE BESY-BENAZEPRIL HCL 5-10 MG PO CAPS: 1.0000 | ORAL_CAPSULE | Freq: Every day | ORAL | 5 refills | Status: DC

## 2017-06-22 NOTE — Progress Notes (Signed)
Subjective:    Patient ID: Christina Delgado, female    DOB: 1930/12/21, 83 y.o.   MRN: [id number]  HPI  Christina Delgado is here today for follow up of chronic medical problem.  Outpatient Encounter Medications as of 06/22/2017  Medication Sig  . amLODipine-benazepril (LOTREL) 5-10 MG capsule Take 1 capsule by mouth daily.  . brimonidine (ALPHAGAN) 0.15 % ophthalmic solution Place 1 drop into both eyes 2 (two) times daily.  . Calcium Carbonate-Vitamin D (CALCIUM 600+D) 600-400 MG-UNIT per tablet Take 1 tablet by mouth daily.  . DORZOLAMIDE HCL-TIMOLOL MAL OP Apply 1 drop to eye 2 (two) times daily.  Marland Kitchen latanoprost (XALATAN) 0.005 % ophthalmic solution Place 1 drop into the left eye at bedtime.  . Misc Natural Products (OSTEO BI-FLEX TRIPLE STRENGTH) TABS Take 4,000 each by mouth daily. Patient takes 2000 mg(2 tablets at one time daily)  . Multiple Vitamins-Minerals ([employer] SPECTRAVITE PO) Take 1 capsule by mouth daily.  . Omega-3 Fatty Acids (FISH OIL) 1200 MG CAPS Take 1,200 mg by mouth 3 (three) times daily.  . potassium chloride SA (K-DUR,KLOR-CON) 20 MEQ tablet Take 2 tablets (40 mEq total) by mouth daily.     1. Hyperlipidemia with target LDL less than 100  Does not watch diet at all  2. Type 2 diabetes mellitus without complication, without long-term current use of insulin ([name]) last HGBA1c was 5.7%. Patient does not check blood sugar sat home.  3. Essential hypertension  No c/o chest pain, sob or headache. Does not check blood pressures at home. BP Readings from Last 3 Encounters:  03/20/17 (!) 157/78  12/21/16 (!) 142/70  09/19/16 114/62     4. Hypokalemia  No c/o lower ext cramping  5. BMI 24.0-24.9,adult  No recent weight changes  6. Pathological fracture due to osteoporosis with routine healing, unspecified fracture site, unspecified osteoporosis type, subsequent encounter  Has occasional back pain. Needs dexa scan    New complaints: Family concerned about weight  loss- does not eat very well.  Social history: Lives by herself and her daughters check on her daily    Review of Systems  Constitutional: Negative for activity change and appetite change.  HENT: Negative.   Eyes: Negative for pain.  Respiratory: Negative for shortness of breath.   Cardiovascular: Negative for chest pain, palpitations and leg swelling.  Gastrointestinal: Negative for abdominal pain.  Endocrine: Negative for polydipsia.  Genitourinary: Negative.   Skin: Negative for rash.  Neurological: Negative for dizziness, weakness and headaches.  Hematological: Does not bruise/bleed easily.  Psychiatric/Behavioral: Negative.   All other systems reviewed and are negative.      Objective:   Physical Exam  Constitutional: She is oriented to person, place, and time. She appears well-developed and well-nourished.  HENT:  Nose: Nose normal.  Mouth/Throat: Oropharynx is clear and moist.  Eyes: EOM are normal.  Neck: Trachea normal, normal range of motion and full passive range of motion without pain. Neck supple. No JVD present. Carotid bruit is not present. No thyromegaly present.  Cardiovascular: Normal rate, regular rhythm, normal heart sounds and intact distal pulses. Exam reveals no gallop and no friction rub.  No murmur heard. Pulmonary/Chest: Effort normal and breath sounds normal.  Abdominal: Soft. Bowel sounds are normal. She exhibits no distension and no mass. There is no tenderness.  Musculoskeletal: Normal range of motion.  Lymphadenopathy:    She has no cervical adenopathy.  Neurological: She is alert and oriented to person, place, and time. She  has normal reflexes.  Skin: Skin is warm and dry.  Psychiatric: She has a normal mood and affect. Her behavior is normal. Judgment and thought content normal.   BP (!) 98/56   Pulse (!) 56   Temp (!) 96.9 F (36.1 C) (Oral)   Ht '5\' 1"'  (1.549 m)   Wt 132 lb (59.9 kg)   BMI 24.94 kg/m    Chest xray- mild  cardiomegaly-Preliminary reading by Ronnald Collum, FNP  [facility]     Assessment & Plan:  1. Hyperlipidemia with target LDL less than 100 Low fat dist - Lipid panel  2. Type 2 diabetes mellitus without complication, without long-term current use of insulin ([name]) Watch carbs oin diet - Bayer DCA Hb A1c Waived - Microalbumin / creatinine urine ratio  3. Essential hypertension Low sodium diet - CMP14+EGFR - amLODipine-benazepril (LOTREL) 5-10 MG capsule; Take 1 capsule by mouth daily.  Dispense: 30 capsule; Refill: 5  4. Hypokalemia - potassium chloride SA (K-DUR,KLOR-CON) 20 MEQ tablet; Take 2 tablets (40 mEq total) by mouth daily.  Dispense: 60 tablet; Refill: 5  5. BMI 24.0-24.9, adult No more weight loss  6. Pathological fracture due to osteoporosis with routine healing, unspecified fracture site, unspecified osteoporosis type, subsequent encounter Weight bearing exercises if has someone near by in case falls  7. Weight loss - DG Chest 2 View; Future - megestrol (MEGACE) 20 MG tablet; Take 1 tablet (20 mg total) by mouth daily.  Dispense: 30 tablet; Refill: 5    Labs pending Health maintenance reviewed Diet and exercise encouraged Continue all meds Follow up  In 3 months   [place], FNP

## 2017-06-22 NOTE — Patient Instructions (Signed)

## 2017-06-23 LAB — MICROALBUMIN / CREATININE URINE RATIO
Creatinine, Urine: 64 mg/dL
MICROALB/CREAT RATIO: 26.4 mg/g{creat} (ref 0.0–30.0)
Microalbumin, Urine: 16.9 ug/mL

## 2017-06-23 LAB — CMP14+EGFR
ALT: 22 IU/L (ref 0–32)
AST: 30 IU/L (ref 0–40)
Albumin/Globulin Ratio: 1.3 (ref 1.2–2.2)
Albumin: 4.4 g/dL (ref 3.5–4.7)
Alkaline Phosphatase: 69 IU/L (ref 39–117)
BUN/Creatinine Ratio: 12 (ref 12–28)
BUN: 7 mg/dL — ABNORMAL LOW (ref 8–27)
Bilirubin Total: 0.4 mg/dL (ref 0.0–1.2)
CALCIUM: 9.6 mg/dL (ref 8.7–10.3)
CO2: 26 mmol/L (ref 20–29)
CREATININE: 0.57 mg/dL (ref 0.57–1.00)
Chloride: 103 mmol/L (ref 96–106)
GFR, EST AFRICAN AMERICAN: 98 mL/min/{1.73_m2} (ref >59)
GFR, EST NON AFRICAN AMERICAN: 85 mL/min/{1.73_m2} (ref >59)
GLOBULIN, TOTAL: 3.5 g/dL (ref 1.5–4.5)
Glucose: 91 mg/dL (ref 65–99)
Potassium: 3.7 mmol/L (ref 3.5–5.2)
Sodium: 144 mmol/L (ref 134–144)
TOTAL PROTEIN: 7.9 g/dL (ref 6.0–8.5)

## 2017-06-23 LAB — LIPID PANEL
CHOL/HDL RATIO: 2.6 ratio (ref 0.0–4.4)
Cholesterol, Total: 154 mg/dL (ref 100–199)
HDL: 60 mg/dL (ref >39)
LDL CALC: 82 mg/dL (ref 0–99)
TRIGLYCERIDES: 62 mg/dL (ref 0–149)
VLDL Cholesterol Cal: 12 mg/dL (ref 5–40)

## 2017-06-27 ENCOUNTER — Encounter: Payer: Self-pay | Admitting: *Deleted

## 2017-07-27 LAB — HM DIABETES EYE EXAM

## 2017-07-30 ENCOUNTER — Ambulatory Visit: Payer: Medicare Other | Admitting: *Deleted

## 2017-07-31 ENCOUNTER — Encounter: Payer: Self-pay | Admitting: Nurse Practitioner

## 2017-08-14 ENCOUNTER — Ambulatory Visit: Payer: Medicare Other | Admitting: *Deleted

## 2017-08-20 ENCOUNTER — Ambulatory Visit: Payer: Medicare Other | Admitting: *Deleted

## 2017-09-05 ENCOUNTER — Ambulatory Visit (INDEPENDENT_AMBULATORY_CARE_PROVIDER_SITE_OTHER): Payer: Medicare Other

## 2017-09-05 ENCOUNTER — Ambulatory Visit (INDEPENDENT_AMBULATORY_CARE_PROVIDER_SITE_OTHER): Payer: Medicare Other | Admitting: *Deleted

## 2017-09-05 ENCOUNTER — Encounter: Payer: Self-pay | Admitting: *Deleted

## 2017-09-05 VITALS — BP 119/64 | HR 69 | Ht 61.0 in | Wt 135.0 lb

## 2017-09-05 DIAGNOSIS — Z780 Asymptomatic menopausal state: Secondary | ICD-10-CM | POA: Diagnosis not present

## 2017-09-05 DIAGNOSIS — Z0000 Encounter for general adult medical examination without abnormal findings: Secondary | ICD-10-CM | POA: Diagnosis not present

## 2017-09-05 DIAGNOSIS — Z23 Encounter for immunization: Secondary | ICD-10-CM

## 2017-09-05 NOTE — Progress Notes (Addendum)
Subjective:   Christina Delgado is a 84 y.o. female who presents for a subsequent [facility] Annual Wellness Visit.  Christina Delgado lives alone. Her husband passed away about 2.5 years ago. She has 2 adult sons and 2 adult daughters, 4 grandchildren, and one great grandchild. She is a retired Engineer, site.   Review of Systems    Health is about the same as last year.   Cardiac Risk Factors include: hypertension;sedentary lifestyle;advanced age (>19men, >10 women)  Other systems negative.    Objective:    Today's Vitals   09/05/17 0856  BP: 119/64  Pulse: 69  Weight: 135 lb (61.2 kg)  Height: 5\' 1"  (1.549 m)   Body mass index is 25.51 kg/m.  Advanced Directives 06/29/2016 06/24/2014  Does Patient Have a Medical Advance Directive? Yes No  Does patient want to make changes to medical advance directive? No - Patient declined -  Would patient like information on creating a medical advance directive? - Yes - Educational materials given    Current Medications (verified) Outpatient Encounter Medications as of 09/05/2017  Medication Sig  . amLODipine-benazepril (LOTREL) 5-10 MG capsule Take 1 capsule by mouth daily.  . brimonidine (ALPHAGAN) 0.15 % ophthalmic solution Place 1 drop into both eyes 2 (two) times daily.  . Calcium Carbonate-Vitamin D (CALCIUM 600+D) 600-400 MG-UNIT per tablet Take 1 tablet by mouth daily.  . DORZOLAMIDE HCL-TIMOLOL MAL OP Apply 1 drop to eye 2 (two) times daily.  Marland Kitchen latanoprost (XALATAN) 0.005 % ophthalmic solution Place 1 drop into the left eye at bedtime.  . megestrol (MEGACE) 20 MG tablet Take 1 tablet (20 mg total) by mouth daily.  . Misc Natural Products (OSTEO BI-FLEX TRIPLE STRENGTH) TABS Take 4,000 each by mouth daily. Patient takes 2000 mg(2 tablets at one time daily)  . Multiple Vitamins-Minerals ([employer]S SPECTRAVITE PO) Take 1 capsule by mouth daily.  . Omega-3 Fatty Acids (FISH OIL) 1200 MG CAPS Take 1,200 mg by mouth 3 (three) times daily.  .  potassium chloride SA (K-DUR,KLOR-CON) 20 MEQ tablet Take 2 tablets (40 mEq total) by mouth daily.   No facility-administered encounter medications on file as of 09/05/2017.     Allergies (verified) Patient has no known allergies.   History: Past Medical History:  Diagnosis Date  . Ankle fracture, left   . Diabetes mellitus without complication ([name])   . Generalized osteoarthrosis, involving multiple sites   . Glaucoma   . Hepatitis, unspecified   . Hypertension   . Hypopotassemia   . Osteoporosis    Past Surgical History:  Procedure Laterality Date  . FRACTURE SURGERY Left       . REFRACTIVE SURGERY     Family History  Problem Relation Age of Onset  . Stroke Father 67  . Diabetes Sister   . Stroke Mother 26  . Diabetes Mother   . Stroke Brother   . Hypertension Sister   . Hyperlipidemia Sister   . Cancer Brother        LUNG  . Diabetes Brother    Social History   Socioeconomic History  . Marital status: Married    Spouse name: Not on file  . Number of children: 4  . Years of education: 16  . Highest education level: Bachelor's degree (e.g., BA, AB, BS)  Social Needs  . Financial resource strain: Not hard at all  . Food insecurity - worry: Never true  . Food insecurity - inability: Never true  . Transportation needs -  medical: No  . Transportation needs - non-medical: No  Occupational History  . 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 Smoking status: Never Smoker  . Smokeless tobacco: Never Used  Substance and Sexual Activity  . Alcohol use: No  . Drug use: No  . Sexual activity: Not Currently  Other Topics Concern  . Not on file  Social History Narrative  . Not on file    Tobacco Counseling Counseling given: Not Answered   Clinical Intake:  Pre-visit preparation completed: No  Pain : No/denies pain        How often do you need to have someone help you when you read instructions,  pamphlets, or other written materials from your doctor or pharmacy?: 1 - Never What is the last grade level you completed in school?: bachelors disease  Interpreter Needed?: No  Information entered by :: Demetrios LollKristen Partick Musselman, RN   Activities of Daily Living In your present state of health, do you have any difficulty performing the following activities: 09/05/2017  Hearing? N  Vision? N  Comment Sees Dr Dione BoozeGroat every four months  Difficulty concentrating or making decisions? N  Walking or climbing stairs? N  Dressing or bathing? N  Doing errands, shopping? N  Preparing Food and eating ? N  Using the Toilet? N  In the past six months, have you accidently leaked urine? N  Do you have problems with loss of bowel control? N  Managing your Medications? N  Comment keeps them organized  Managing your Finances? N  Housekeeping or managing your Housekeeping? N  Comment Keeps house straight and has someone come in once a week to help with heavier cleaning  Some recent data might be hidden     Immunizations and Health Maintenance Immunization History  Administered Date(s) Administered  . Influenza, High Dose Seasonal PF 04/26/2015, 03/15/2017  . Influenza,inj,Quad PF,6+ Mos 04/02/2013  . Influenza,inj,quad, With Preservative 03/31/2014  . Influenza-Unspecified 1930/12/17  . Pneumococcal Conjugate-13 01/01/2015  . Pneumococcal Polysaccharide-23 04/26/2009  . Td 03/27/2007  . Tdap 09/05/2017  . Zoster 03/10/2013   Health Maintenance Due  Topic Date Due  . MAMMOGRAM  01/20/2015  . TETANUS/TDAP  03/26/2017    Patient Care Team: Bennie PieriniMartin, Mary-Margaret, FNP as PCP - General (Nurse Practitioner) Olivia CanterGroat, Richard Scott, MD as Consulting Physician (Ophthalmology)  Indicate any recent Medical Services you may have received from other than [facility] providers in the past year (date may be approximate).     Assessment:   This is a routine wellness examination for Christina Delgado.  Hearing/Vision screen Some  hearing difficulties noted although patient does not endorse this.  Eye exam is up to date.   Dietary issues and exercise activities discussed: Current Exercise Habits: The patient does not participate in regular exercise at present, Exercise limited by: orthopedic condition(s)   Diet Eats 3 meals a day and drinks water and orange juice. Reports eating a healthy diet and limits pork. She takes megace to help with appetite and she has noticed improvement.   Goals    . Exercise 3x per week (30 min per time)     Walk 3 times a week. Start out with 10 minutes a day and increase as tolerated.  Do chair exercises daily.       Depression Screen PHQ 2/9 Scores 09/05/2017 06/22/2017 03/20/2017 03/20/2017 12/21/2016 09/19/2016 06/29/2016  PHQ - 2 Score 0 0 0 2 0 0 0  PHQ- 9 Score - - -  3 - - -    Fall Risk Fall Risk  09/05/2017 06/22/2017 03/20/2017 03/20/2017 12/21/2016  Falls in the past year? No No No No No    Is the patient's home free of loose throw rugs in walkways, pet beds, electrical cords, etc?   yes      Grab bars in the bathroom? no      Handrails on the stairs?   yes      Adequate lighting?   yes   Cognitive Function: MMSE - Mini Mental State Exam 09/05/2017 06/29/2016  Orientation to time 4 5  Orientation to Place 5 5  Registration 3 3  Attention/ Calculation 5 4  Recall 1 2  Language- name 2 objects 2 2  Language- repeat 1 1  Language- follow 3 step command 3 3  Language- read & follow direction 1 1  Write a sentence 1 1  Copy design 1 1  Total score 27 28        Screening Tests Health Maintenance  Topic Date Due  . MAMMOGRAM  01/20/2015  . TETANUS/TDAP  03/26/2017  . FOOT EXAM  09/19/2017  . HEMOGLOBIN A1C  12/21/2017  . OPHTHALMOLOGY EXAM  07/27/2018  . INFLUENZA VACCINE  Completed  . DEXA SCAN  Completed  . PNA vac Low Risk Adult  Completed      Plan:  Move carefully to avoid falls.  Use cane for balance.  Keep f/u with PCP Dexa done today Tdap given  I  have personally reviewed and noted the following in the patient's chart:   . Medical and social history . Use of alcohol, tobacco or illicit drugs  . Current medications and supplements . Functional ability and status . Nutritional status . Physical activity . Advanced directives . List of other physicians . Hospitalizations, surgeries, and ER visits in previous 12 months . Vitals . Screenings to include cognitive, depression, and falls . Referrals and appointments  In addition, I have reviewed and discussed with patient certain preventive protocols, quality metrics, and best practice recommendations. A written personalized care plan for preventive services as well as general preventive health recommendations were provided to patient.     Demetrios Loll, RN   09/05/2017   I have reviewed and agree with the above AWV documentation.   Rex Kras, MD [facility] 09/05/2017, 5:04 PM

## 2017-09-05 NOTE — Patient Instructions (Signed)
Christina Delgado , Thank you for taking time to come for your Medicare Wellness Visit. I appreciate your ongoing commitment to your health goals. Please review the following plan we discussed and let me know if I can assist you in the future.   These are the goals we discussed: Goals    . Exercise 3x per week (30 min per time)     Walk 3 times a week. Start out with 10 minutes a day and increase as tolerated.  Do chair exercises daily.        This is a list of the screening recommended for you and due dates:  Health Maintenance  Topic Date Due  . Mammogram  01/20/2015  . Tetanus Vaccine  03/26/2017  . Complete foot exam   09/19/2017  . Hemoglobin A1C  12/21/2017  . Eye exam for diabetics  07/27/2018  . Flu Shot  Completed  . DEXA scan (bone density measurement)  Completed  . Pneumonia vaccines  Completed    Tdap Vaccine (Tetanus, Diphtheria and Pertussis): What You Need to Know 1. Why get vaccinated? Tetanus, diphtheria and pertussis are very serious diseases. Tdap vaccine can protect [place] from these diseases. And, Tdap vaccine given to pregnant women can protect newborn babies against pertussis. TETANUS (Lockjaw) is rare in the [place] today. It causes painful muscle tightening and stiffness, usually all over the body.  It can lead to tightening of muscles in the head and neck so you can't open your mouth, swallow, or sometimes even breathe. Tetanus kills about 1 out of 10 people who are infected even after receiving the best medical care.  DIPHTHERIA is also rare in the [place] today. It can cause a thick coating to form in the back of the throat.  It can lead to breathing problems, heart failure, paralysis, and death.  PERTUSSIS (Whooping Cough) causes severe coughing spells, which can cause difficulty breathing, vomiting and disturbed sleep.  It can also lead to weight loss, incontinence, and rib fractures. Up to 2 in 100 adolescents and 5 in 100 adults with  pertussis are hospitalized or have complications, which could include pneumonia or death.  These diseases are caused by bacteria. Diphtheria and pertussis are spread from person to person through secretions from coughing or sneezing. Tetanus enters the body through cuts, scratches, or wounds. Before vaccines, as many as 200,000 cases of diphtheria, 200,000 cases of pertussis, and hundreds of cases of tetanus, were reported in the [place] each year. Since vaccination began, reports of cases for tetanus and diphtheria have dropped by about 99% and for pertussis by about 80%. 2. Tdap vaccine Tdap vaccine can protect adolescents and adults from tetanus, diphtheria, and pertussis. One dose of Tdap is routinely given at age 58 or 37. People who did not get Tdap at that age should get it as soon as possible. Tdap is especially important for healthcare professionals and anyone having close contact with a baby younger than 12 months. Pregnant women should get a dose of Tdap during every pregnancy, to protect the newborn from pertussis. Infants are most at risk for severe, life-threatening complications from pertussis. Another vaccine, called Td, protects against tetanus and diphtheria, but not pertussis. A Td booster should be given every 10 years. Tdap may be given as one of these boosters if you have never gotten Tdap before. Tdap may also be given after a severe cut or burn to prevent tetanus infection. Your doctor or the person giving you the vaccine  can give you more information. Tdap may safely be given at the same time as other vaccines. 3. Some people should not get this vaccine  A person who has ever had a life-threatening allergic reaction after a previous dose of any diphtheria, tetanus or pertussis containing vaccine, OR has a severe allergy to any part of this vaccine, should not get Tdap vaccine. Tell the person giving the vaccine about any severe allergies.  Anyone who had coma or long  repeated seizures within 7 days after a childhood dose of DTP or DTaP, or a previous dose of Tdap, should not get Tdap, unless a cause other than the vaccine was found. They can still get Td.  Talk to your doctor if you: ? have seizures or another nervous system problem, ? had severe pain or swelling after any vaccine containing diphtheria, tetanus or pertussis, ? ever had a condition called Guillain-Barr Syndrome (GBS), ? aren't feeling well on the day the shot is scheduled. 4. Risks With any medicine, including vaccines, there is a chance of side effects. These are usually mild and go away on their own. Serious reactions are also possible but are rare. Most people who get Tdap vaccine do not have any problems with it. Mild problems following Tdap: (Did not interfere with activities)  Pain where the shot was given (about 3 in 4 adolescents or 2 in 3 adults)  Redness or swelling where the shot was given (about 1 person in 5)  Mild fever of at least 100.46F (up to about 1 in 25 adolescents or 1 in 100 adults)  Headache (about 3 or 4 people in 10)  Tiredness (about 1 person in 3 or 4)  Nausea, vomiting, diarrhea, stomach ache (up to 1 in 4 adolescents or 1 in 10 adults)  Chills, sore joints (about 1 person in 10)  Body aches (about 1 person in 3 or 4)  Rash, swollen glands (uncommon)  Moderate problems following Tdap: (Interfered with activities, but did not require medical attention)  Pain where the shot was given (up to 1 in 5 or 6)  Redness or swelling where the shot was given (up to about 1 in 16 adolescents or 1 in 12 adults)  Fever over 102F (about 1 in 100 adolescents or 1 in 250 adults)  Headache (about 1 in 7 adolescents or 1 in 10 adults)  Nausea, vomiting, diarrhea, stomach ache (up to 1 or 3 people in 100)  Swelling of the entire arm where the shot was given (up to about 1 in 500).  Severe problems following Tdap: (Unable to perform usual activities;  required medical attention)  Swelling, severe pain, bleeding and redness in the arm where the shot was given (rare).  Problems that could happen after any vaccine:  People sometimes faint after a medical procedure, including vaccination. Sitting or lying down for about 15 minutes can help prevent fainting, and injuries caused by a fall. Tell your doctor if you feel dizzy, or have vision changes or ringing in the ears.  Some people get severe pain in the shoulder and have difficulty moving the arm where a shot was given. This happens very rarely.  Any medication can cause a severe allergic reaction. Such reactions from a vaccine are very rare, estimated at fewer than 1 in a million doses, and would happen within a few minutes to a few hours after the vaccination. As with any medicine, there is a very remote chance of a vaccine causing a serious injury  or death. The safety of vaccines is always being monitored. For more information, visit: http://floyd.org/ 5. What if there is a serious problem? What should I look for? Look for anything that concerns you, such as signs of a severe allergic reaction, very high fever, or unusual behavior. Signs of a severe allergic reaction can include hives, swelling of the face and throat, difficulty breathing, a fast heartbeat, dizziness, and weakness. These would usually start a few minutes to a few hours after the vaccination. What should I do?  If you think it is a severe allergic reaction or other emergency that can't wait, call 9-1-1 or get the person to the nearest hospital. Otherwise, call your doctor.  Afterward, the reaction should be reported to the Vaccine Adverse Event Reporting System (VAERS). Your doctor might file this report, or you can do it yourself through the VAERS web site at www.vaers.LAgents.no, or by calling 1-(684)801-0939. ? VAERS does not give medical advice. 6. The National Vaccine Injury Compensation Program The [employer]  Vaccine Injury Compensation Program (VICP) is a federal program that was created to compensate people who may have been injured by certain vaccines. Persons who believe they may have been injured by a vaccine can learn about the program and about filing a claim by calling 1-431-029-4079 or visiting the VICP website at SpiritualWord.at. There is a time limit to file a claim for compensation. 7. How can I learn more?  Ask your doctor. He or she can give you the vaccine package insert or suggest other sources of information.  Call your local or state health department.  Contact the Centers for Disease Control and Prevention (CDC): ? Call 843 822 1055 (1-800-CDC-INFO) or ? Visit CDC's website at PicCapture.uy CDC Tdap Vaccine VIS (08/19/13) This information is not intended to replace advice given to you by your health care provider. Make sure you discuss any questions you have with your health care provider. Document Released: 12/12/2011 Document Revised: 03/02/2016 Document Reviewed: 03/02/2016 Elsevier Interactive Patient Education  2017 [employer].

## 2017-09-21 ENCOUNTER — Encounter: Payer: Self-pay | Admitting: Nurse Practitioner

## 2017-09-21 ENCOUNTER — Ambulatory Visit: Payer: Medicare Other | Admitting: Nurse Practitioner

## 2017-09-21 VITALS — BP 108/58 | HR 64 | Temp 97.0°F | Ht 61.0 in | Wt 135.0 lb

## 2017-09-21 DIAGNOSIS — Z6825 Body mass index (BMI) 25.0-25.9, adult: Secondary | ICD-10-CM

## 2017-09-21 DIAGNOSIS — M47896 Other spondylosis, lumbar region: Secondary | ICD-10-CM | POA: Diagnosis not present

## 2017-09-21 DIAGNOSIS — K759 Inflammatory liver disease, unspecified: Secondary | ICD-10-CM

## 2017-09-21 DIAGNOSIS — E876 Hypokalemia: Secondary | ICD-10-CM

## 2017-09-21 DIAGNOSIS — I10 Essential (primary) hypertension: Secondary | ICD-10-CM | POA: Diagnosis not present

## 2017-09-21 DIAGNOSIS — E785 Hyperlipidemia, unspecified: Secondary | ICD-10-CM

## 2017-09-21 DIAGNOSIS — E119 Type 2 diabetes mellitus without complications: Secondary | ICD-10-CM

## 2017-09-21 DIAGNOSIS — M8000XD Age-related osteoporosis with current pathological fracture, unspecified site, subsequent encounter for fracture with routine healing: Secondary | ICD-10-CM | POA: Diagnosis not present

## 2017-09-21 LAB — CMP14+EGFR
ALBUMIN: 3.8 g/dL (ref 3.5–4.7)
ALK PHOS: 52 IU/L (ref 39–117)
ALT: 22 IU/L (ref 0–32)
AST: 23 IU/L (ref 0–40)
Albumin/Globulin Ratio: 1.2 (ref 1.2–2.2)
BILIRUBIN TOTAL: 0.4 mg/dL (ref 0.0–1.2)
BUN / CREAT RATIO: 10 — AB (ref 12–28)
BUN: 6 mg/dL — AB (ref 8–27)
CHLORIDE: 105 mmol/L (ref 96–106)
CO2: 22 mmol/L (ref 20–29)
CREATININE: 0.63 mg/dL (ref 0.57–1.00)
Calcium: 9.5 mg/dL (ref 8.7–10.3)
GFR calc non Af Amer: 83 mL/min/{1.73_m2} (ref >59)
GFR, EST AFRICAN AMERICAN: 95 mL/min/{1.73_m2} (ref >59)
GLOBULIN, TOTAL: 3.2 g/dL (ref 1.5–4.5)
GLUCOSE: 94 mg/dL (ref 65–99)
Potassium: 3.8 mmol/L (ref 3.5–5.2)
Sodium: 141 mmol/L (ref 134–144)
Total Protein: 7 g/dL (ref 6.0–8.5)

## 2017-09-21 LAB — LIPID PANEL
CHOLESTEROL TOTAL: 117 mg/dL (ref 100–199)
Chol/HDL Ratio: 3.3 ratio (ref 0.0–4.4)
HDL: 36 mg/dL — ABNORMAL LOW (ref >39)
LDL CALC: 69 mg/dL (ref 0–99)
Triglycerides: 61 mg/dL (ref 0–149)
VLDL CHOLESTEROL CAL: 12 mg/dL (ref 5–40)

## 2017-09-21 LAB — BAYER DCA HB A1C WAIVED: HB A1C: 5.2 % (ref <7.0)

## 2017-09-21 NOTE — Progress Notes (Signed)
Subjective:    Patient ID: Christina MONDA, female    DOB: October 21, 1930, 84 y.o.   MRN: [id number]  HPI  Christina Delgado is here today for follow up of chronic medical problem.  Outpatient Encounter Medications as of 09/21/2017  Medication Sig  . amLODipine-benazepril (LOTREL) 5-10 MG capsule Take 1 capsule by mouth daily.  . brimonidine (ALPHAGAN) 0.15 % ophthalmic solution Place 1 drop into both eyes 2 (two) times daily.  . Calcium Carbonate-Vitamin D (CALCIUM 600+D) 600-400 MG-UNIT per tablet Take 1 tablet by mouth daily.  . DORZOLAMIDE HCL-TIMOLOL MAL OP Apply 1 drop to eye 2 (two) times daily.  Marland Kitchen latanoprost (XALATAN) 0.005 % ophthalmic solution Place 1 drop into the left eye at bedtime.  . megestrol (MEGACE) 20 MG tablet Take 1 tablet (20 mg total) by mouth daily.  . Misc Natural Products (OSTEO BI-FLEX TRIPLE STRENGTH) TABS Take 4,000 each by mouth daily. Patient takes 2000 mg(2 tablets at one time daily)  . Multiple Vitamins-Minerals (CVS SPECTRAVITE PO) Take 1 capsule by mouth daily.  . Omega-3 Fatty Acids (FISH OIL) 1200 MG CAPS Take 1,200 mg by mouth 3 (three) times daily.  . potassium chloride SA (K-DUR,KLOR-CON) 20 MEQ tablet Take 2 tablets (40 mEq total) by mouth daily.     1. Essential hypertension  No c/o chest pain, sob or headache. Does not check blood pressures at home  2. Hepatitis no longer sees GI- has no problems that she s awareof LFT's have been in nromal range with lab work.  3. Type 2 diabetes mellitus without complication, without long-term current use of insulin ([place]) last HGA1C was 5.7%. She does not check her blood sugars very often.  4. Other osteoarthritis of spine, lumbar region  Has back pain daily.  She just takes motrin or tylenol when needed.  5. Pathological fracture due to osteoporosis with routine healing, unspecified fracture site, unspecified osteoporosis type, subsequent encounter . Last dexascan was 09/05/17 with t score of -1.9 which now  shows osteopenia.   6. BMI 25.0-25.9, adult no recent weight changes  7. Hyperlipidemia with target LDL less than 100  Does not watch diet. Very little exercise.  8. Hypokalemia  No c/o lower ext cramping. Takes no supplements. Last potassium level was 3.7.    New complaints: None today  Social history: Patient lives by herself- she denies having Delgado medical alert system. Says her daughters check on her often    Review of Systems  Constitutional: Negative for activity change and appetite change.  HENT: Negative.   Eyes: Negative for pain.  Respiratory: Negative for shortness of breath.   Cardiovascular: Negative for chest pain, palpitations and leg swelling.  Gastrointestinal: Negative for abdominal pain.  Endocrine: Negative for polydipsia.  Genitourinary: Negative.   Skin: Negative for rash.  Neurological: Negative for dizziness, weakness and headaches.  Hematological: Does not bruise/bleed easily.  Psychiatric/Behavioral: Negative.   All other systems reviewed and are negative.      Objective:   Physical Exam  Constitutional: She is oriented to person, place, and time. She appears well-developed and well-nourished.  HENT:  Nose: Nose normal.  Mouth/Throat: Oropharynx is clear and moist.  Eyes: EOM are normal.  Neck: Trachea normal, normal range of motion and full passive range of motion without pain. Neck supple. No JVD present. Carotid bruit is not present. No thyromegaly present.  Cardiovascular: Normal rate, regular rhythm, normal heart sounds and intact distal pulses. Exam reveals no gallop and no friction rub.  No murmur heard. Pulmonary/Chest: Effort normal and breath sounds normal.  Abdominal: Soft. Bowel sounds are normal. She exhibits no distension and no mass. There is no tenderness.  Musculoskeletal: Normal range of motion.  Lymphadenopathy:    She has no cervical adenopathy.  Neurological: She is alert and oriented to person, place, and time. She has  normal reflexes.  Skin: Skin is warm and dry.  Psychiatric: She has Delgado normal mood and affect. Her behavior is normal. Judgment and thought content normal.   BP (!) 108/58   Pulse 64   Temp (!) 97 F (36.1 C) (Oral)   Ht _0  (1.549 m)   Wt 135 lb (61.2 kg)   BMI 25.51 kg/m        Assessment & Plan:  1. Essential hypertension Low sodium diet  2. Hepatitis  3. Type 2 diabetes mellitus without complication, without long-term current use of insulin ([name]) Continue to watch carbsin diet  4. Other osteoarthritis of spine, lumbar region Fall prevention  5. Pathological fracture due to osteoporosis with routine healing, unspecified fracture site, unspecified osteoporosis type, subsequent encounter Does not want to do anymore dexascans  6. BMI 25.0-25.9,adult Discussed diet and exercise for person with BMI >25 Will recheck weight in 3-6 months   7. Hyperlipidemia with target LDL less than 100 Low fat diet  8. Hypokalemia Labs pending  Orders Placed This Encounter  Procedures  . CMP14+EGFR  . Lipid panel  . Bayer DCA Hb A1c Waived    Labs pending Health maintenance reviewed Diet and exercise encouraged Continue all meds Follow up  In 3 months   [place], FNP

## 2017-09-21 NOTE — Patient Instructions (Signed)

## 2017-11-28 ENCOUNTER — Emergency Department (HOSPITAL_BASED_OUTPATIENT_CLINIC_OR_DEPARTMENT_OTHER)
Admission: EM | Admit: 2017-11-28 | Discharge: 2017-11-28 | Disposition: A | Discharge status: Home / Self Care | Payer: Medicare Other | Attending: Emergency Medicine | Admitting: Emergency Medicine

## 2017-11-28 ENCOUNTER — Emergency Department (HOSPITAL_BASED_OUTPATIENT_CLINIC_OR_DEPARTMENT_OTHER): Payer: Medicare Other

## 2017-11-28 ENCOUNTER — Other Ambulatory Visit: Payer: Self-pay

## 2017-11-28 ENCOUNTER — Encounter (HOSPITAL_BASED_OUTPATIENT_CLINIC_OR_DEPARTMENT_OTHER): Payer: Self-pay | Admitting: Adult Health

## 2017-11-28 DIAGNOSIS — Z79899 Other long term (current) drug therapy: Secondary | ICD-10-CM | POA: Insufficient documentation

## 2017-11-28 DIAGNOSIS — I10 Essential (primary) hypertension: Secondary | ICD-10-CM | POA: Insufficient documentation

## 2017-11-28 DIAGNOSIS — R404 Transient alteration of awareness: Secondary | ICD-10-CM | POA: Insufficient documentation

## 2017-11-28 DIAGNOSIS — W19XXXA Unspecified fall, initial encounter: Secondary | ICD-10-CM

## 2017-11-28 DIAGNOSIS — Y92012 Bathroom of single-family (private) house as the place of occurrence of the external cause: Secondary | ICD-10-CM | POA: Insufficient documentation

## 2017-11-28 DIAGNOSIS — Y999 Unspecified external cause status: Secondary | ICD-10-CM | POA: Insufficient documentation

## 2017-11-28 DIAGNOSIS — W1811XA Fall from or off toilet without subsequent striking against object, initial encounter: Secondary | ICD-10-CM | POA: Diagnosis not present

## 2017-11-28 DIAGNOSIS — Y9389 Activity, other specified: Secondary | ICD-10-CM | POA: Insufficient documentation

## 2017-11-28 DIAGNOSIS — Y92009 Unspecified place in unspecified non-institutional (private) residence as the place of occurrence of the external cause: Secondary | ICD-10-CM

## 2017-11-28 LAB — CBC WITH DIFFERENTIAL/PLATELET
BASOS ABS: 0 10*3/uL (ref 0.0–0.1)
Basophils Relative: 0 %
Eosinophils Absolute: 0 10*3/uL (ref 0.0–0.7)
Eosinophils Relative: 0 %
HEMATOCRIT: 34.4 % — AB (ref 36.0–46.0)
Hemoglobin: 11.7 g/dL — ABNORMAL LOW (ref 12.0–15.0)
LYMPHS ABS: 1.3 10*3/uL (ref 0.7–4.0)
LYMPHS PCT: 13 %
MCH: 30.1 pg (ref 26.0–34.0)
MCHC: 34 g/dL (ref 30.0–36.0)
MCV: 88.4 fL (ref 78.0–100.0)
Monocytes Absolute: 0.5 10*3/uL (ref 0.1–1.0)
Monocytes Relative: 5 %
NEUTROS ABS: 8.5 10*3/uL — AB (ref 1.7–7.7)
Neutrophils Relative %: 82 %
Platelets: 149 10*3/uL — ABNORMAL LOW (ref 150–400)
RBC: 3.89 MIL/uL (ref 3.87–5.11)
RDW: 13.1 % (ref 11.5–15.5)
WBC: 10.3 10*3/uL (ref 4.0–10.5)

## 2017-11-28 LAB — BASIC METABOLIC PANEL
ANION GAP: 7 (ref 5–15)
BUN: 14 mg/dL (ref 6–20)
CHLORIDE: 111 mmol/L (ref 101–111)
CO2: 23 mmol/L (ref 22–32)
Calcium: 9.4 mg/dL (ref 8.9–10.3)
Creatinine, Ser: 0.7 mg/dL (ref 0.44–1.00)
GFR calc Af Amer: >60 mL/min (ref >60)
GFR calc non Af Amer: >60 mL/min (ref >60)
Glucose, Bld: 209 mg/dL — ABNORMAL HIGH (ref 65–99)
POTASSIUM: 4.2 mmol/L (ref 3.5–5.1)
Sodium: 141 mmol/L (ref 135–145)

## 2017-11-28 LAB — URINALYSIS, ROUTINE W REFLEX MICROSCOPIC
Bilirubin Urine: NEGATIVE
GLUCOSE, UA: NEGATIVE mg/dL
Hgb urine dipstick: NEGATIVE
Ketones, ur: NEGATIVE mg/dL
Nitrite: NEGATIVE
PH: 6 (ref 5.0–8.0)
PROTEIN: NEGATIVE mg/dL
SPECIFIC GRAVITY, URINE: 1.02 (ref 1.005–1.030)

## 2017-11-28 LAB — URINALYSIS, MICROSCOPIC (REFLEX)

## 2017-11-28 NOTE — ED Notes (Signed)
Pt daughter at bedside

## 2017-11-28 NOTE — ED Notes (Signed)
Pt reports "I was using the bathroom and went to get up and fell". Pt denies LOC. Pt states she remembers event and says "her family calls all the time and when she did not answer EMS came". Pt unable to tell this RN how long she was on the floor. Pt gate unbalanced. Pt appears to be weak. Pt neuro intact.

## 2017-11-28 NOTE — ED Provider Notes (Signed)
[facility] Provider Note: Lowella DellJ. Lane Tomeko Scoville, MD, FACEP  CSN: [id number] MRN: [id number] ARRIVAL: 11/28/17 at 0025 ROOM: [facility]04   CHIEF COMPLAINT  Fall   HISTORY OF PRESENT ILLNESS  11/28/17 4:31 AM Christina Delgado is a 84 y.o. female who fell in the bathroom at home yesterday evening.  She was unable to get up on her own.  She denies injury.  She states she just lost her balance denies passing out.  Her daughter could not get in touch with her by phone so sent the police to check on her and they found her on the floor.  She has not had a fever.  She denies pain.  She is a little unsteady on her feet at baseline and walks with a cane.  She is at her baseline mentally currently.   Past Medical History:  Diagnosis Date  . Ankle fracture, left   . Generalized osteoarthrosis, involving multiple sites   . Glaucoma   . Hepatitis, unspecified   . Hypertension   . Hypopotassemia   . Osteoporosis     Past Surgical History:  Procedure Laterality Date  . FRACTURE SURGERY Left       . REFRACTIVE SURGERY      Family History  Problem Relation Age of Onset  . Stroke Father 1190  . Diabetes Sister   . Stroke Mother [id number]  . Diabetes Mother   . Stroke Brother   . Hypertension Sister   . Hyperlipidemia Sister   . Cancer Brother        LUNG  . Diabetes Brother     Social History   Tobacco Use  . Smoking status: Never Smoker  . Smokeless tobacco: Never Used  Substance Use Topics  . Alcohol use: No  . Drug use: No    Prior to Admission medications   Medication Sig Start Date End Date Taking? Authorizing Provider  amLODipine-benazepril (LOTREL) 5-10 MG capsule Take 1 capsule by mouth daily. 06/22/17   Daphine DeutscherMartin, Mary-Margaret, FNP  brimonidine (ALPHAGAN) 0.15 % ophthalmic solution Place 1 drop into both eyes 2 (two) times daily.    [provider]  Calcium Carbonate-Vitamin D (CALCIUM 600+D) 600-400 MG-UNIT per tablet Take 1 tablet by mouth daily.    [provider]  DORZOLAMIDE HCL-TIMOLOL MAL OP Apply 1 drop to eye 2 (two) times daily.    [provider]  latanoprost (XALATAN) 0.005 % ophthalmic solution Place 1 drop into the left eye at bedtime.    [provider]  megestrol (MEGACE) 20 MG tablet Take 1 tablet (20 mg total) by mouth daily. 06/22/17   Bennie PieriniMartin, Mary-Margaret, FNP  Misc Natural Products (OSTEO BI-FLEX TRIPLE STRENGTH) TABS Take 4,000 each by mouth daily. Patient takes 2000 mg(2 tablets at one time daily)    [provider]  Multiple Vitamins-Minerals ([employer] SPECTRAVITE PO) Take 1 capsule by mouth daily.    [provider]  Omega-3 Fatty Acids (FISH OIL) 1200 MG CAPS Take 1,200 mg by mouth 3 (three) times daily.    [provider]  potassium chloride SA (K-DUR,KLOR-CON) 20 MEQ tablet Take 2 tablets (40 mEq total) by mouth daily. 06/22/17   Bennie PieriniMartin, Mary-Margaret, FNP    Allergies Patient has no known allergies.   REVIEW OF SYSTEMS  Negative except as noted here or in the History of Present Illness.   PHYSICAL EXAMINATION  Initial Vital Signs Blood pressure (!) 121/53, pulse 82, temperature 98.4 F (36.9 C), resp. rate 15, height 5'  2" (1.575 m), weight 60.3 kg (133 lb), SpO2 100 %.  Examination General: Well-developed, well-nourished female in no acute distress; appearance consistent with age of record HENT: normocephalic; atraumatic Eyes: pupils equal, round and reactive to light; extraocular muscles intact Neck: supple Heart: regular rate and rhythm Lungs: clear to auscultation bilaterally Abdomen: soft; nondistended; nontender; bowel sounds present Extremities: No deformity; full range of motion; pulses normal Neurologic: Awake, alert and oriented x 3; motor function intact in all extremities and symmetric; no facial droop; normal finger-to-nose; able to ambulate with assistance Skin: Warm and dry Psychiatric: Normal mood and affect   RESULTS  Summary of this visit's  results, reviewed by myself:   EKG Interpretation  Date/Time:    Ventricular Rate:    PR Interval:    QRS Duration:   QT Interval:    QTC Calculation:   R Axis:     Text Interpretation:        Laboratory Studies: Results for orders placed or performed during the hospital encounter of 11/28/17 (from the past 24 hour(s))  CBC with Differential     Status: Abnormal   Collection Time: 11/28/17 12:47 AM  Result Value Ref Range   WBC 10.3 4.0 - 10.5 K/uL   RBC 3.89 3.87 - 5.11 MIL/uL   Hemoglobin 11.7 (L) 12.0 - 15.0 g/dL   HCT 82.9 (L) 56.2 - 13.0 %   MCV 88.4 78.0 - 100.0 fL   MCH 30.1 26.0 - 34.0 pg   MCHC 34.0 30.0 - 36.0 g/dL   RDW 86.5 78.4 - 69.6 %   Platelets 149 (L) 150 - 400 K/uL   Neutrophils Relative % 82 %   Neutro Abs 8.5 (H) 1.7 - 7.7 K/uL   Lymphocytes Relative 13 %   Lymphs Abs 1.3 0.7 - 4.0 K/uL   Monocytes Relative 5 %   Monocytes Absolute 0.5 0.1 - 1.0 K/uL   Eosinophils Relative 0 %   Eosinophils Absolute 0.0 0.0 - 0.7 K/uL   Basophils Relative 0 %   Basophils Absolute 0.0 0.0 - 0.1 K/uL  Basic metabolic panel     Status: Abnormal   Collection Time: 11/28/17 12:47 AM  Result Value Ref Range   Sodium 141 135 - 145 mmol/L   Potassium 4.2 3.5 - 5.1 mmol/L   Chloride 111 101 - 111 mmol/L   CO2 23 22 - 32 mmol/L   Glucose, Bld 209 (H) 65 - 99 mg/dL   BUN 14 6 - 20 mg/dL   Creatinine, Ser 2.95 0.44 - 1.00 mg/dL   Calcium 9.4 8.9 - 28.4 mg/dL   GFR calc non Af Amer >60 >60 mL/min   GFR calc Af Amer >60 >60 mL/min   Anion gap 7 5 - 15  Urinalysis, Routine w reflex microscopic     Status: Abnormal   Collection Time: 11/28/17  4:56 AM  Result Value Ref Range   Color, Urine YELLOW YELLOW   APPearance CLEAR CLEAR   Specific Gravity, Urine 1.020 1.005 - 1.030   pH 6.0 5.0 - 8.0   Glucose, UA NEGATIVE NEGATIVE mg/dL   Hgb urine dipstick NEGATIVE NEGATIVE   Bilirubin Urine NEGATIVE NEGATIVE   Ketones, ur NEGATIVE NEGATIVE mg/dL   Protein, ur NEGATIVE  NEGATIVE mg/dL   Nitrite NEGATIVE NEGATIVE   Leukocytes, UA TRACE (A) NEGATIVE  Urinalysis, Microscopic (reflex)     Status: Abnormal   Collection Time: 11/28/17  4:56 AM  Result Value Ref Range   RBC / HPF 0-5  0 - 5 RBC/hpf   WBC, UA 6-10 0 - 5 WBC/hpf   Bacteria, UA FEW (A) NONE SEEN   Squamous Epithelial / LPF 0-5 0 - 5   Imaging Studies: Ct Head Wo Contrast  Result Date: 11/28/2017 CLINICAL DATA:  Acute onset of altered level of consciousness. Concern for fall. Initial encounter. EXAM: CT HEAD WITHOUT CONTRAST TECHNIQUE: Contiguous axial images were obtained from the base of the skull through the vertex without intravenous contrast. COMPARISON:  None. FINDINGS: Brain: No evidence of acute infarction, hemorrhage, hydrocephalus, extra-axial collection or mass lesion / mass effect. Prominence of the ventricles and sulci reflects mild cortical volume loss. Scattered periventricular and subcortical white matter change likely reflects small vessel ischemic microangiopathy. The brainstem and fourth ventricle are within normal limits. The basal ganglia are unremarkable in appearance. The cerebral hemispheres demonstrate grossly normal gray-white differentiation. No mass effect or midline shift is seen. Vascular: No hyperdense vessel or unexpected calcification. Skull: There is no evidence of fracture; visualized osseous structures are unremarkable in appearance. Sinuses/Orbits: The orbits are within normal limits. The paranasal sinuses and mastoid air cells are well-aerated. Other: No significant soft tissue abnormalities are seen. IMPRESSION: 1. No evidence of traumatic intracranial injury or fracture. 2. Mild cortical volume loss and scattered small vessel ischemic microangiopathy. Electronically Signed   By: Roanna Raider M.D.   On: 11/28/2017 02:44    ED COURSE and MDM  Nursing notes and initial vitals signs, including pulse oximetry, reviewed.  Vitals:   11/28/17 0152 11/28/17 0200 11/28/17  0315 11/28/17 0509  BP:  (!) 121/53  (!) 136/57  Pulse:  85 82 88  Resp:  17 15 17   Temp: 98.4 F (36.9 C)     TempSrc:      SpO2:  99% 100% 100%  Weight:      Height:       Patient and daughter advised of reassuring diagnostic studies.  Her urinalysis is nondiagnostic of urinary tract infection in the absence of symptoms.  It was sent for culture as a precaution.  Her daughter will stay with her today and observe her and follow-up with her primary care physician if there are continued concerns.  The daughter states her elevated blood sugar is likely due to fondness for butterscotch candies.  She states her mother's A1c is usually normal but she is concerned that her mother is not eating healthy.   PROCEDURES    ED DIAGNOSES     ICD-10-CM   1. Fall in home, initial encounter W19.XXXA    Y92.009        Paula Libra, MD 11/28/17 [phone]

## 2017-11-28 NOTE — ED Notes (Signed)
Assisted up to BR 

## 2017-11-28 NOTE — ED Triage Notes (Signed)
Patient presents with daughter. Pt lives in [place] and while using the bathroom this evening she fell. SHe staes she was trying to get up and the police broke in her home and called her daughter. HER daughter is concerned because the patient has been "foggy headed" and fell this evening. PT lives alone. She is unable to tell me how long she was on the bathroom floor. HEr daughter last spoke to her this AM at 411 am. She drove out to [place] to bring her here to be checked out. THey would like to know why she fell. PT denies injury. She is alert and talking and answering all questions appropriately.

## 2017-11-28 NOTE — ED Notes (Signed)
Patient transported to CT 

## 2017-11-29 LAB — URINE CULTURE: Culture: <10000 — AB

## 2017-12-06 ENCOUNTER — Encounter: Payer: Self-pay | Admitting: Nurse Practitioner

## 2017-12-06 ENCOUNTER — Ambulatory Visit: Payer: Medicare Other | Admitting: Nurse Practitioner

## 2017-12-06 VITALS — BP 137/64 | HR 79 | Temp 97.0°F | Ht 62.0 in | Wt 134.0 lb

## 2017-12-06 DIAGNOSIS — Z9181 History of falling: Secondary | ICD-10-CM | POA: Diagnosis not present

## 2017-12-06 DIAGNOSIS — F0390 Unspecified dementia without behavioral disturbance: Secondary | ICD-10-CM

## 2017-12-06 DIAGNOSIS — Z09 Encounter for follow-up examination after completed treatment for conditions other than malignant neoplasm: Secondary | ICD-10-CM

## 2017-12-06 DIAGNOSIS — W19XXXD Unspecified fall, subsequent encounter: Secondary | ICD-10-CM

## 2017-12-06 NOTE — Progress Notes (Addendum)
   Subjective:    Patient ID: Christina Delgado Maina, female    DOB: 12/22/1930, 84 y.o.   MRN: [id number]   Chief Complaint: Hospital follow up from fall (Needs home health referral)   HPI Patient come sin today for hospital follow up. She fell at home. Patient says she slipped and fell and landed on her butt. Denies hitting her head. She could not get up on her own. They took her to ER. CT scan was negative. All labs were normal except blood sugar was over 200. Urine was clear. Her daughter Is with her today and is concerned about her. Daughter is concerned about her living alone and taking meds properly. Would like home health to come in and check vitals and make sure she is ok. Daughter would also like for PT to come in and ork with her on her balance. She has some slight dementia but is always oriented to person, place and time.   Review of Systems  Constitutional: Negative for activity change and appetite change.  HENT: Negative.   Eyes: Negative for pain.  Respiratory: Negative for shortness of breath.   Cardiovascular: Negative for chest pain, palpitations and leg swelling.  Gastrointestinal: Negative for abdominal pain.  Endocrine: Negative for polydipsia.  Genitourinary: Negative.   Skin: Negative for rash.  Neurological: Negative for dizziness, weakness and headaches.  Hematological: Does not bruise/bleed easily.  Psychiatric/Behavioral: Negative.   All other systems reviewed and are negative.      Objective:   Physical Exam  Constitutional: She is oriented to person, place, and time. She appears well-developed and well-nourished. No distress.  Cardiovascular: Normal rate and regular rhythm.  Pulmonary/Chest: Breath sounds normal.  Musculoskeletal: Normal range of motion. She exhibits no edema.  Gait slow and steady- walking with cane  Neurological: She is alert and oriented to person, place, and time. No cranial nerve deficit.  Skin: Skin is warm and dry.  Psychiatric: She  has a normal mood and affect. Her behavior is normal. Judgment and thought content normal.  Nursing note and vitals reviewed.  BP 137/64   Pulse 79   Temp (!) 97 F (36.1 C) (Oral)   Ht 5\' 2"  (1.575 m)   Wt 134 lb (60.8 kg)   BMI 24.51 kg/m      Assessment & Plan:  Christina Delgado Baty in today with chief complaint of Hospital follow up from fall (Needs home health referral)   1. Fall, subsequent encounter - Ambulatory referral to Home Health  2. Hospital discharge follow-up Hospital records reviewed  3. Dementia  Ordered home health to come in and evaluate and make sure vitals are normal and she is taking her meds Will be evaluated for PT for balance. Encouraged patient to wear her medical alert necklace at al times - never know when may need to use it. RTO for regular follow up  Mary-Margaret Daphine DeutscherMartin, FNP

## 2017-12-06 NOTE — Patient Instructions (Signed)

## 2017-12-21 ENCOUNTER — Ambulatory Visit (INDEPENDENT_AMBULATORY_CARE_PROVIDER_SITE_OTHER): Payer: Medicare Other

## 2017-12-21 DIAGNOSIS — W010XXD Fall on same level from slipping, tripping and stumbling without subsequent striking against object, subsequent encounter: Secondary | ICD-10-CM

## 2017-12-21 DIAGNOSIS — M6281 Muscle weakness (generalized): Secondary | ICD-10-CM

## 2017-12-21 DIAGNOSIS — F0390 Unspecified dementia without behavioral disturbance: Secondary | ICD-10-CM | POA: Diagnosis not present

## 2017-12-21 DIAGNOSIS — R2689 Other abnormalities of gait and mobility: Secondary | ICD-10-CM | POA: Diagnosis not present

## 2017-12-31 ENCOUNTER — Encounter: Payer: Self-pay | Admitting: Nurse Practitioner

## 2017-12-31 ENCOUNTER — Ambulatory Visit: Payer: Medicare Other | Admitting: Nurse Practitioner

## 2017-12-31 VITALS — BP 137/67 | HR 68 | Temp 97.2°F | Ht 62.0 in | Wt 136.0 lb

## 2017-12-31 DIAGNOSIS — K759 Inflammatory liver disease, unspecified: Secondary | ICD-10-CM | POA: Diagnosis not present

## 2017-12-31 DIAGNOSIS — M47896 Other spondylosis, lumbar region: Secondary | ICD-10-CM

## 2017-12-31 DIAGNOSIS — I10 Essential (primary) hypertension: Secondary | ICD-10-CM

## 2017-12-31 DIAGNOSIS — E785 Hyperlipidemia, unspecified: Secondary | ICD-10-CM

## 2017-12-31 DIAGNOSIS — E876 Hypokalemia: Secondary | ICD-10-CM

## 2017-12-31 DIAGNOSIS — E119 Type 2 diabetes mellitus without complications: Secondary | ICD-10-CM | POA: Diagnosis not present

## 2017-12-31 DIAGNOSIS — M8000XD Age-related osteoporosis with current pathological fracture, unspecified site, subsequent encounter for fracture with routine healing: Secondary | ICD-10-CM | POA: Diagnosis not present

## 2017-12-31 DIAGNOSIS — Z6824 Body mass index (BMI) 24.0-24.9, adult: Secondary | ICD-10-CM

## 2017-12-31 LAB — BAYER DCA HB A1C WAIVED: HB A1C: 5.4 % (ref <7.0)

## 2017-12-31 MED ORDER — AMLODIPINE BESY-BENAZEPRIL HCL 5-10 MG PO CAPS: 1.0000 | ORAL_CAPSULE | Freq: Every day | ORAL | 5 refills | Status: DC

## 2017-12-31 MED ORDER — POTASSIUM CHLORIDE CRYS ER 20 MEQ PO TBCR: 40.0000 meq | EXTENDED_RELEASE_TABLET | Freq: Every day | ORAL | 5 refills | Status: DC

## 2017-12-31 NOTE — Progress Notes (Signed)
Subjective:    Patient ID: Christina Delgado, female    DOB: Aug 31, 1930, 84 y.o.   MRN: [id number]   Chief Complaint: Medical Management of Chronic issues  HPI:  1. Essential hypertension  No c/o chest pain, sob or headache. Does not check blood pressure at home. BP Readings from Last 3 Encounters:  12/06/17 137/64  11/28/17 (!) 136/57  09/21/17 (!) 108/58     2. Hepatitis  Does not see GI. Is not having any problems that she is aware of.   3. Type 2 diabetes mellitus without complication, without long-term current use of insulin ([place]) last HGBA1c 5.2% . She does not check blood sugars very often but when she does she says they are below 130. She is currently just doing diet control  4. Pathological fracture due to osteoporosis with routine healing, unspecified fracture site, unspecified osteoporosis type, subsequent encounter  Last dexascan was done 09/05/17 with tscore of -1.9. Showed osteopenia. She is currently just on calcium and vitamin d supplement.  5. Hypokalemia  Takes potassium supplement daily. Denies any lower ext cramping  6. Hyperlipidemia with target LDL less than 100  Does not watch diet and does very little exercise.  7. BMI 24.0-24.9, adult  No recent weight changes  8. Other osteoarthritis of spine, lumbar region  Has back pain but says is usually tolerable. She takes tylenol or ibuprofen as needed. raytes pain around 1-2/10.    Outpatient Encounter Medications as of 12/31/2017  Medication Sig  . amLODipine-benazepril (LOTREL) 5-10 MG capsule Take 1 capsule by mouth daily.  . brimonidine (ALPHAGAN) 0.15 % ophthalmic solution Place 1 drop into both eyes 2 (two) times daily.  . Calcium Carbonate-Vitamin D (CALCIUM 600+D) 600-400 MG-UNIT per tablet Take 1 tablet by mouth daily.  . DORZOLAMIDE HCL-TIMOLOL MAL OP Apply 1 drop to eye 2 (two) times daily.  Marland Kitchen latanoprost (XALATAN) 0.005 % ophthalmic solution Place 1 drop into the left eye at bedtime.  . megestrol  (MEGACE) 20 MG tablet Take 1 tablet (20 mg total) by mouth daily.  . Misc Natural Products (OSTEO BI-FLEX TRIPLE STRENGTH) TABS Take 4,000 each by mouth daily. Patient takes 2000 mg(2 tablets at one time daily)  . Multiple Vitamins-Minerals ([employer] SPECTRAVITE PO) Take 1 capsule by mouth daily.  . Omega-3 Fatty Acids (FISH OIL) 1200 MG CAPS Take 1,200 mg by mouth 3 (three) times daily.  . potassium chloride SA (K-DUR,KLOR-CON) 20 MEQ tablet Take 2 tablets (40 mEq total) by mouth daily.       New complaints: None  today  Social history: Lives alone - she has 2 daughters that check on her often.   Review of Systems  Constitutional: Negative for activity change and appetite change.  HENT: Negative.   Eyes: Negative for pain.  Respiratory: Negative for shortness of breath.   Cardiovascular: Negative for chest pain, palpitations and leg swelling.  Gastrointestinal: Negative for abdominal pain.  Endocrine: Negative for polydipsia.  Genitourinary: Negative.   Musculoskeletal: Positive for gait problem.  Skin: Negative for rash.  Neurological: Negative for dizziness, weakness and headaches.  Hematological: Does not bruise/bleed easily.  Psychiatric/Behavioral: Negative.   All other systems reviewed and are negative.      Objective:   Physical Exam  Constitutional: She is oriented to person, place, and time. She appears well-developed and well-nourished. No distress.  HENT:  Head: Normocephalic.  Nose: Nose normal.  Mouth/Throat: Oropharynx is clear and moist.  Eyes: Pupils are equal, round, and reactive to  light. EOM are normal.  Neck: Normal range of motion. Neck supple. No JVD present. Carotid bruit is not present.  Cardiovascular: Normal rate, regular rhythm, normal heart sounds and intact distal pulses.  Pulmonary/Chest: Effort normal and breath sounds normal. No respiratory distress. She has no wheezes. She has no rales. She exhibits no tenderness.  Abdominal: Soft. Normal  appearance, normal aorta and bowel sounds are normal. She exhibits no distension, no abdominal bruit, no pulsatile midline mass and no mass. There is no splenomegaly or hepatomegaly. There is no tenderness.  Musculoskeletal: Normal range of motion. She exhibits no edema (compression hose in place).  Gait slow and steady- uses cane for added balance.  Lymphadenopathy:    She has no cervical adenopathy.  Neurological: She is alert and oriented to person, place, and time. She has normal reflexes.  Skin: Skin is warm and dry.  Psychiatric: She has a normal mood and affect. Her behavior is normal. Judgment and thought content normal.   BP 137/67   Pulse 68   Temp (!) 97.2 F (36.2 C) (Oral)   Ht 5' 2" (1.575 m)   Wt 136 lb (61.7 kg)   BMI 24.87 kg/m        Assessment & Plan:  Christina Delgado comes in today with chief complaint of Medical Management of Chronic Issues   Diagnosis and orders addressed:  1. Essential hypertension Low sodium diet - amLODipine-benazepril (LOTREL) 5-10 MG capsule; Take 1 capsule by mouth daily.  Dispense: 30 capsule; Refill: 5 - CMP14+EGFR  2. Hepatitis  3. Type 2 diabetes mellitus without complication, without long-term current use of insulin ([name]) Continue to watcth carbs in diet - Bayer DCA Hb A1c Waived  4. Pathological fracture due to osteoporosis with routine healing, unspecified fracture site, unspecified osteoporosis type, subsequent encounter Fall precautions  5. Hypokalemia - potassium chloride SA (K-DUR,KLOR-CON) 20 MEQ tablet; Take 2 tablets (40 mEq total) by mouth daily.  Dispense: 60 tablet; Refill: 5  6. Hyperlipidemia with target LDL less than 100 Low fat diet - Lipid panel  7. BMI 24.0-24.9, adult Discussed diet and exercise for person with BMI >25 Will recheck weight in 3-6 months  8. Other osteoarthritis of spine, lumbar region   Labs pending Health Maintenance reviewed Diet and exercise encouraged  Follow up  plan: 3 months   Lilly, FNP

## 2017-12-31 NOTE — Patient Instructions (Signed)

## 2018-01-01 ENCOUNTER — Other Ambulatory Visit: Payer: Self-pay | Admitting: Nurse Practitioner

## 2018-01-01 DIAGNOSIS — R634 Abnormal weight loss: Secondary | ICD-10-CM

## 2018-01-01 LAB — CMP14+EGFR
ALBUMIN: 3.8 g/dL (ref 3.5–4.7)
ALT: 21 IU/L (ref 0–32)
AST: 22 IU/L (ref 0–40)
Albumin/Globulin Ratio: 1.2 (ref 1.2–2.2)
Alkaline Phosphatase: 47 IU/L (ref 39–117)
BUN / CREAT RATIO: 24 (ref 12–28)
BUN: 16 mg/dL (ref 8–27)
Bilirubin Total: 0.4 mg/dL (ref 0.0–1.2)
CO2: 22 mmol/L (ref 20–29)
CREATININE: 0.68 mg/dL (ref 0.57–1.00)
Calcium: 9.4 mg/dL (ref 8.7–10.3)
Chloride: 108 mmol/L — ABNORMAL HIGH (ref 96–106)
GFR, EST AFRICAN AMERICAN: 92 mL/min/{1.73_m2} (ref >59)
GFR, EST NON AFRICAN AMERICAN: 80 mL/min/{1.73_m2} (ref >59)
GLUCOSE: 87 mg/dL (ref 65–99)
Globulin, Total: 3.3 g/dL (ref 1.5–4.5)
Potassium: 4 mmol/L (ref 3.5–5.2)
Sodium: 141 mmol/L (ref 134–144)
TOTAL PROTEIN: 7.1 g/dL (ref 6.0–8.5)

## 2018-01-01 LAB — LIPID PANEL
Chol/HDL Ratio: 3.2 ratio (ref 0.0–4.4)
Cholesterol, Total: 115 mg/dL (ref 100–199)
HDL: 36 mg/dL — ABNORMAL LOW (ref >39)
LDL CALC: 67 mg/dL (ref 0–99)
Triglycerides: 60 mg/dL (ref 0–149)
VLDL CHOLESTEROL CAL: 12 mg/dL (ref 5–40)

## 2018-03-30 ENCOUNTER — Other Ambulatory Visit: Payer: Self-pay | Admitting: Nurse Practitioner

## 2018-03-30 DIAGNOSIS — E876 Hypokalemia: Secondary | ICD-10-CM

## 2018-03-30 DIAGNOSIS — I10 Essential (primary) hypertension: Secondary | ICD-10-CM

## 2018-04-11 ENCOUNTER — Telehealth: Payer: Self-pay | Admitting: Nurse Practitioner

## 2018-04-12 NOTE — Telephone Encounter (Signed)
Ok to write letter for mrs. Pedretti

## 2018-04-15 NOTE — Telephone Encounter (Signed)
Letter complete and placed at front desk.  Christina Delgado aware and will pick up at Christina Delgado's appointment with [name] tomorrow.

## 2018-04-16 ENCOUNTER — Encounter: Payer: Self-pay | Admitting: Nurse Practitioner

## 2018-04-16 ENCOUNTER — Ambulatory Visit (INDEPENDENT_AMBULATORY_CARE_PROVIDER_SITE_OTHER): Payer: Medicare Other | Admitting: Nurse Practitioner

## 2018-04-16 VITALS — BP 140/86 | HR 97 | Temp 96.8°F | Ht 62.0 in | Wt 143.0 lb

## 2018-04-16 DIAGNOSIS — K759 Inflammatory liver disease, unspecified: Secondary | ICD-10-CM | POA: Diagnosis not present

## 2018-04-16 DIAGNOSIS — E119 Type 2 diabetes mellitus without complications: Secondary | ICD-10-CM | POA: Diagnosis not present

## 2018-04-16 DIAGNOSIS — Z23 Encounter for immunization: Secondary | ICD-10-CM

## 2018-04-16 DIAGNOSIS — M8000XD Age-related osteoporosis with current pathological fracture, unspecified site, subsequent encounter for fracture with routine healing: Secondary | ICD-10-CM | POA: Diagnosis not present

## 2018-04-16 DIAGNOSIS — R413 Other amnesia: Secondary | ICD-10-CM

## 2018-04-16 DIAGNOSIS — I10 Essential (primary) hypertension: Secondary | ICD-10-CM

## 2018-04-16 DIAGNOSIS — E876 Hypokalemia: Secondary | ICD-10-CM

## 2018-04-16 MED ORDER — AMLODIPINE BESY-BENAZEPRIL HCL 5-10 MG PO CAPS: 1.0000 | ORAL_CAPSULE | Freq: Every day | ORAL | 5 refills | Status: DC

## 2018-04-16 MED ORDER — DONEPEZIL HCL 5 MG PO TABS
5.0000 mg | ORAL_TABLET | Freq: Every day | ORAL | 5 refills | Status: DC
Start: 2018-04-16 — End: 2018-08-02

## 2018-04-16 MED ORDER — POTASSIUM CHLORIDE CRYS ER 20 MEQ PO TBCR: 40.0000 meq | EXTENDED_RELEASE_TABLET | Freq: Every day | ORAL | 5 refills | Status: DC

## 2018-04-16 NOTE — Patient Instructions (Signed)

## 2018-04-16 NOTE — Progress Notes (Signed)
Subjective:    Patient ID: Christina Delgado, female    DOB: 1931/04/02, 84 y.o.   MRN: [id number]   Chief Complaint: Medical Management of Chronic Issues   HPI:  1. Essential hypertension  -does not check blood pressure at home -does not watch salt intake -no c/o HA/CP/SOB BP Readings from Last 3 Encounters:  12/31/17 137/67  12/06/17 137/64  11/28/17 (!) 136/57     2. Hepatitis  -no issues she is aware of -does not have a GI physician  3. Type 2 diabetes mellitus without complication, without long-term current use of insulin ([name])  -does not check her blood sugars at home -no signs/symptoms of hypo or hyperglycemia -does not watch diet -likes to eat candy, daughter says she eats a lot of candy  4. Pathological fracture due to osteoporosis with routine healing, unspecified fracture site, unspecified osteoporosis type, subsequent encounter  -last DEXA 09/05/17 with t score of -1.9 and osteopenia  5. Hypokalemia  -no crampings    Outpatient Encounter Medications as of 04/16/2018  Medication Sig  . amLODipine-benazepril (LOTREL) 5-10 MG capsule Take 1 capsule by mouth daily.  . brimonidine (ALPHAGAN) 0.15 % ophthalmic solution Place 1 drop into both eyes 2 (two) times daily.  . Calcium Carbonate-Vitamin D (CALCIUM 600+D) 600-400 MG-UNIT per tablet Take 1 tablet by mouth daily.  . DORZOLAMIDE HCL-TIMOLOL MAL OP Apply 1 drop to eye 2 (two) times daily.  Marland Kitchen latanoprost (XALATAN) 0.005 % ophthalmic solution Place 1 drop into the left eye at bedtime.  . megestrol (MEGACE) 20 MG tablet Take 1 tablet (20 mg total) by mouth daily.  . Misc Natural Products (OSTEO BI-FLEX TRIPLE STRENGTH) TABS Take 4,000 each by mouth daily. Patient takes 2000 mg(2 tablets at one time daily)  . Multiple Vitamins-Minerals ([employer] SPECTRAVITE PO) Take 1 capsule by mouth daily.  . Omega-3 Fatty Acids (FISH OIL) 1200 MG CAPS Take 1,200 mg by mouth 3 (three) times daily.  . potassium chloride SA  (K-DUR,KLOR-CON) 20 MEQ tablet Take 2 tablets (40 mEq total) by mouth daily.   No facility-administered encounter medications on file as of 04/16/2018.     New complaints: None today but daughter says she had a recent teeth cleaning and dentist said she has tooth abscesses; daughter hopes pt will follow up with with dentist and get abscess taken care of  Social history:  Lives by herself in [place]   Review of Systems  Constitutional: Negative for activity change, appetite change, chills, fatigue, fever and unexpected weight change.  HENT: Negative for congestion, ear pain, rhinorrhea, sinus pressure, sinus pain and sore throat.   Eyes: Negative for pain, redness and visual disturbance.  Respiratory: Negative for cough, chest tightness, shortness of breath and wheezing.   Cardiovascular: Negative for chest pain, palpitations and leg swelling.  Gastrointestinal: Negative for abdominal pain, constipation, diarrhea, nausea and vomiting.  Endocrine: Negative for cold intolerance, heat intolerance, polydipsia, polyphagia and polyuria.  Genitourinary: Negative for difficulty urinating, dysuria and urgency.  Musculoskeletal: Negative for arthralgias, gait problem, joint swelling and myalgias.  Skin: Negative for rash and wound.  Allergic/Immunologic: Negative for environmental allergies and food allergies.  Neurological: Negative for dizziness, tremors, weakness and numbness.  Hematological: Does not bruise/bleed easily.  Psychiatric/Behavioral: Negative for behavioral problems, confusion, decreased concentration, sleep disturbance and suicidal ideas. The patient is not nervous/anxious.        Objective:   Physical Exam  Constitutional: She is oriented to person, place, and time. She appears well-developed and  well-nourished.  HENT:  Head: Normocephalic and atraumatic.  Right Ear: External ear normal. Decreased hearing is noted.  Left Ear: External ear normal. Decreased hearing is  noted.  Nose: Nose normal.  Mouth/Throat: Uvula is midline and oropharynx is clear and moist. She has dentures. No oropharyngeal exudate.  Eyes: Pupils are equal, round, and reactive to light. Conjunctivae and EOM are normal.  Neck: Normal range of motion. Neck supple. No thyromegaly present.  Cardiovascular: Regular rhythm, normal heart sounds and intact distal pulses. Tachycardia present.  Pulmonary/Chest: Effort normal. She has decreased breath sounds in the right lower field and the left lower field.  Abdominal: Soft. Bowel sounds are normal.  Musculoskeletal: Normal range of motion.  Neurological: She is alert and oriented to person, place, and time. She displays normal reflexes. No cranial nerve deficit.  Skin: Skin is warm and dry.  Psychiatric: She has a normal mood and affect. Her behavior is normal. Judgment and thought content normal. She exhibits abnormal recent memory (per daughter).  Nursing note and vitals reviewed.   BP 140/86   Pulse 97   Temp (!) 96.8 F (36 C) (Oral)   Ht _0  (1.575 m)   Wt 143 lb (64.9 kg)   BMI 26.16 kg/m     Assessment & Plan:  Christina Delgado comes in today with chief complaint of Medical Management of Chronic Issues   Diagnosis and orders addressed:  1. Essential hypertension - CMP14+EGFR - amLODipine-benazepril (LOTREL) 5-10 MG capsule; Take 1 capsule by mouth daily.  Dispense: 30 capsule; Refill: 5 -low salt diet -encouraged to check BP  2. Hepatitis -low fat diet   3. Pathological fracture due to osteoporosis with routine healing, unspecified fracture site, unspecified osteoporosis type, subsequent encounter -continue Vit D and Calcium supplementation  4. Hypokalemia - potassium chloride SA (K-DUR,KLOR-CON) 20 MEQ tablet; Take 2 tablets (40 mEq total) by mouth daily.  Dispense: 60 tablet; Refill: 5  5. Memory loss -reorient daily - donepezil (ARICEPT) 5 MG tablet; Take 1 tablet (5 mg total) by mouth at bedtime.   Dispense: 30 tablet; Refill: 5   Labs pending Health Maintenance reviewed Diet and exercise encouraged  Follow up plan: 3 months   Christina Hassell Done, FNP

## 2018-04-17 LAB — CMP14+EGFR
A/G RATIO: 1.3 (ref 1.2–2.2)
ALBUMIN: 4.3 g/dL (ref 3.5–4.7)
ALT: 41 IU/L — ABNORMAL HIGH (ref 0–32)
AST: 32 IU/L (ref 0–40)
Alkaline Phosphatase: 59 IU/L (ref 39–117)
BILIRUBIN TOTAL: 0.5 mg/dL (ref 0.0–1.2)
BUN / CREAT RATIO: 22 (ref 12–28)
BUN: 15 mg/dL (ref 8–27)
CHLORIDE: 104 mmol/L (ref 96–106)
CO2: 23 mmol/L (ref 20–29)
Calcium: 9.7 mg/dL (ref 8.7–10.3)
Creatinine, Ser: 0.68 mg/dL (ref 0.57–1.00)
GFR calc non Af Amer: 80 mL/min/{1.73_m2} (ref >59)
GFR, EST AFRICAN AMERICAN: 92 mL/min/{1.73_m2} (ref >59)
Globulin, Total: 3.2 g/dL (ref 1.5–4.5)
Glucose: 91 mg/dL (ref 65–99)
POTASSIUM: 4 mmol/L (ref 3.5–5.2)
SODIUM: 143 mmol/L (ref 134–144)
TOTAL PROTEIN: 7.5 g/dL (ref 6.0–8.5)

## 2018-06-21 ENCOUNTER — Encounter: Payer: Self-pay | Admitting: Family

## 2018-06-21 ENCOUNTER — Encounter (INDEPENDENT_AMBULATORY_CARE_PROVIDER_SITE_OTHER): Payer: Self-pay

## 2018-06-21 ENCOUNTER — Ambulatory Visit: Payer: Medicare Other | Admitting: Family

## 2018-06-21 VITALS — BP 134/65 | HR 79 | Temp 97.7°F | Ht 62.0 in | Wt 148.8 lb

## 2018-06-21 DIAGNOSIS — N3000 Acute cystitis without hematuria: Secondary | ICD-10-CM | POA: Diagnosis not present

## 2018-06-21 DIAGNOSIS — E875 Hyperkalemia: Secondary | ICD-10-CM | POA: Diagnosis not present

## 2018-06-21 DIAGNOSIS — R410 Disorientation, unspecified: Secondary | ICD-10-CM | POA: Diagnosis not present

## 2018-06-21 MED ORDER — CEPHALEXIN 500 MG PO CAPS: 500.0000 mg | ORAL_CAPSULE | Freq: Two times a day (BID) | ORAL | 0 refills | Status: DC

## 2018-06-21 NOTE — Progress Notes (Signed)
   Subjective:    Patient ID: TALEYA WHITCHER, female    DOB: 1931/03/24, 84 y.o.   MRN: [id number]  Chief Complaint  Patient presents with  . Altered Mental Status    HPI Pt presents to the office today with daughters that they noticed their mother seemed more confused over the last 2-3 days. Patient denies any urinary frequency or dysuria.   Daughter states she noticed her mother had take several extra potassium supplements and would like her potassium checked today.    Review of Systems  All other systems reviewed and are negative.      Objective:   Physical Exam Vitals signs reviewed.  Constitutional:      General: She is not in acute distress.    Appearance: She is well-developed.  HENT:     Head: Normocephalic and atraumatic.     Right Ear: External ear normal.  Eyes:     Pupils: Pupils are equal, round, and reactive to light.  Neck:     Musculoskeletal: Normal range of motion and neck supple.     Thyroid: No thyromegaly.  Cardiovascular:     Rate and Rhythm: Normal rate and regular rhythm.     Heart sounds: Normal heart sounds. No murmur.  Pulmonary:     Effort: Pulmonary effort is normal. No respiratory distress.     Breath sounds: Normal breath sounds. No wheezing.  Abdominal:     General: Bowel sounds are normal. There is no distension.     Palpations: Abdomen is soft.     Tenderness: There is no abdominal tenderness.  Musculoskeletal: Normal range of motion.        General: No tenderness.  Skin:    General: Skin is warm and dry.  Neurological:     Mental Status: She is alert and oriented to person, place, and time.     Cranial Nerves: No cranial nerve deficit.     Deep Tendon Reflexes: Reflexes are normal and symmetric.  Psychiatric:        Behavior: Behavior normal.        Thought Content: Thought content normal.        Judgment: Judgment normal.       BP 134/65   Pulse 79   Temp 97.7 F (36.5 C) (Oral)   Ht _0  (1.575 m)   Wt 148 lb  12.8 oz (67.5 kg)   BMI 27.22 kg/m      Assessment & Plan:  DARICE VICARIO comes in today with chief complaint of Altered Mental Status   Diagnosis and orders addressed:  1. Confusion - Urinalysis, Complete - Urinalysis - Urine Culture - BMP8+EGFR  2. Acute cystitis without hematuria Force fluids Culture pending - cephALEXin (KEFLEX) 500 MG capsule; Take 1 capsule (500 mg total) by mouth 2 (two) times daily.  Dispense: 14 capsule; Refill: 0 - BMP8+EGFR  3. Potassium excess Labs pending - BMP8+EGFR   Evelina Dun, FNP

## 2018-06-21 NOTE — Patient Instructions (Signed)
Urinary Tract Infection, Adult A urinary tract infection (UTI) is an infection of any part of the urinary tract. The urinary tract includes:  The kidneys.  The ureters.  The bladder.  The urethra. These organs make, store, and get rid of pee (urine) in the body. What are the causes? This is caused by germs (bacteria) in your genital area. These germs grow and cause swelling (inflammation) of your urinary tract. What increases the risk? You are more likely to develop this condition if:  You have a small, thin tube (catheter) to drain pee.  You cannot control when you pee or poop (incontinence).  You are female, and: ? You use these methods to prevent pregnancy: ? A medicine that kills sperm (spermicide). ? A device that blocks sperm (diaphragm). ? You have low levels of a female hormone (estrogen). ? You are pregnant.  You have genes that add to your risk.  You are sexually active.  You take antibiotic medicines.  You have trouble peeing because of: ? A prostate that is bigger than normal, if you are female. ? A blockage in the part of your body that drains pee from the bladder (urethra). ? A kidney stone. ? A nerve condition that affects your bladder (neurogenic bladder). ? Not getting enough to drink. ? Not peeing often enough.  You have other conditions, such as: ? Diabetes. ? A weak disease-fighting system (immune system). ? Sickle cell disease. ? Gout. ? Injury of the spine. What are the signs or symptoms? Symptoms of this condition include:  Needing to pee right away (urgently).  Peeing often.  Peeing small amounts often.  Pain or burning when peeing.  Blood in the pee.  Pee that smells bad or not like normal.  Trouble peeing.  Pee that is cloudy.  Fluid coming from the vagina, if you are female.  Pain in the belly or lower back. Other symptoms include:  Throwing up (vomiting).  No urge to eat.  Feeling mixed up (confused).  Being tired  and grouchy (irritable).  A fever.  Watery poop (diarrhea). How is this treated? This condition may be treated with:  Antibiotic medicine.  Other medicines.  Drinking enough water. Follow these instructions at home:  Medicines  Take over-the-counter and prescription medicines only as told by your doctor.  If you were prescribed an antibiotic medicine, take it as told by your doctor. Do not stop taking it even if you start to feel better. General instructions  Make sure you: ? Pee until your bladder is empty. ? Do not hold pee for a long time. ? Empty your bladder after sex. ? Wipe from front to back after pooping if you are a female. Use each tissue one time when you wipe.  Drink enough fluid to keep your pee pale yellow.  Keep all follow-up visits as told by your doctor. This is important. Contact a doctor if:  You do not get better after 1-2 days.  Your symptoms go away and then come back. Get help right away if:  You have very bad back pain.  You have very bad pain in your lower belly.  You have a fever.  You are sick to your stomach (nauseous).  You are throwing up. Summary  A urinary tract infection (UTI) is an infection of any part of the urinary tract.  This condition is caused by germs in your genital area.  There are many risk factors for a UTI. These include having a small, thin   tube to drain pee and not being able to control when you pee or poop.  Treatment includes antibiotic medicines for germs.  Drink enough fluid to keep your pee pale yellow. This information is not intended to replace advice given to you by your health care provider. Make sure you discuss any questions you have with your health care provider. Document Released: 11/29/2007 Document Revised: 12/20/2017 Document Reviewed: 12/20/2017 Elsevier Interactive Patient Education  2019 Elsevier Inc.  

## 2018-06-22 LAB — BMP8+EGFR
BUN / CREAT RATIO: 26 (ref 12–28)
BUN: 18 mg/dL (ref 8–27)
CHLORIDE: 108 mmol/L — AB (ref 96–106)
CO2: 21 mmol/L (ref 20–29)
Calcium: 9.4 mg/dL (ref 8.7–10.3)
Creatinine, Ser: 0.7 mg/dL (ref 0.57–1.00)
GFR calc Af Amer: 91 mL/min/{1.73_m2} (ref >59)
GFR calc non Af Amer: 79 mL/min/{1.73_m2} (ref >59)
Glucose: 127 mg/dL — ABNORMAL HIGH (ref 65–99)
Potassium: 3.9 mmol/L (ref 3.5–5.2)
SODIUM: 145 mmol/L — AB (ref 134–144)

## 2018-06-22 LAB — URINE CULTURE

## 2018-06-24 LAB — URINALYSIS
BILIRUBIN UA: NEGATIVE
Glucose, UA: NEGATIVE
Ketones, UA: NEGATIVE
Nitrite, UA: NEGATIVE
PH UA: 6 (ref 5.0–7.5)
PROTEIN UA: NEGATIVE
RBC UA: NEGATIVE
SPEC GRAV UA: 1.015 (ref 1.005–1.030)
Urobilinogen, Ur: 0.2 mg/dL (ref 0.2–1.0)

## 2018-06-29 ENCOUNTER — Other Ambulatory Visit: Payer: Self-pay | Admitting: Nurse Practitioner

## 2018-06-29 DIAGNOSIS — R634 Abnormal weight loss: Secondary | ICD-10-CM

## 2018-07-22 ENCOUNTER — Ambulatory Visit: Payer: Medicare Other | Admitting: Nurse Practitioner

## 2018-07-26 ENCOUNTER — Other Ambulatory Visit: Payer: Self-pay | Admitting: Nurse Practitioner

## 2018-07-26 DIAGNOSIS — R634 Abnormal weight loss: Secondary | ICD-10-CM

## 2018-08-02 ENCOUNTER — Encounter: Payer: Self-pay | Admitting: Nurse Practitioner

## 2018-08-02 ENCOUNTER — Ambulatory Visit: Payer: Medicare Other | Admitting: Nurse Practitioner

## 2018-08-02 VITALS — BP 140/63 | HR 78 | Temp 96.8°F | Ht 62.0 in | Wt 153.0 lb

## 2018-08-02 DIAGNOSIS — M8000XD Age-related osteoporosis with current pathological fracture, unspecified site, subsequent encounter for fracture with routine healing: Secondary | ICD-10-CM

## 2018-08-02 DIAGNOSIS — R413 Other amnesia: Secondary | ICD-10-CM

## 2018-08-02 DIAGNOSIS — E119 Type 2 diabetes mellitus without complications: Secondary | ICD-10-CM

## 2018-08-02 DIAGNOSIS — I10 Essential (primary) hypertension: Secondary | ICD-10-CM | POA: Diagnosis not present

## 2018-08-02 DIAGNOSIS — E785 Hyperlipidemia, unspecified: Secondary | ICD-10-CM | POA: Diagnosis not present

## 2018-08-02 DIAGNOSIS — E876 Hypokalemia: Secondary | ICD-10-CM | POA: Diagnosis not present

## 2018-08-02 DIAGNOSIS — M47896 Other spondylosis, lumbar region: Secondary | ICD-10-CM

## 2018-08-02 DIAGNOSIS — Z6824 Body mass index (BMI) 24.0-24.9, adult: Secondary | ICD-10-CM

## 2018-08-02 LAB — BAYER DCA HB A1C WAIVED: HB A1C: 5.9 % (ref <7.0)

## 2018-08-02 MED ORDER — POTASSIUM CHLORIDE CRYS ER 20 MEQ PO TBCR: 40.0000 meq | EXTENDED_RELEASE_TABLET | Freq: Every day | ORAL | 5 refills | Status: DC

## 2018-08-02 MED ORDER — DONEPEZIL HCL 5 MG PO TABS: 5.0000 mg | ORAL_TABLET | Freq: Every day | ORAL | 5 refills | Status: DC

## 2018-08-02 MED ORDER — AMLODIPINE BESY-BENAZEPRIL HCL 5-10 MG PO CAPS: 1.0000 | ORAL_CAPSULE | Freq: Every day | ORAL | 5 refills | Status: DC

## 2018-08-02 NOTE — Progress Notes (Signed)
Subjective:    Patient ID: Christina Delgado, female    DOB: Dec 14, 1930, 85 y.o.   MRN: [id number]   Chief Complaint: medical management of chroinic issues  HPI:  1. Essential hypertension  No c/o chest pain, sob or headache.does not check blood pressure at home. BP Readings from Last 3 Encounters:  06/21/18 134/65  04/16/18 140/86  12/31/17 137/67     2. Type 2 diabetes mellitus without complication, without long-term current use of insulin ([name]) last hgba1c was 5.4%. she does not check her blood sugars at home and is on no medications.  3. Hypokalemia  deneis any lower ext cramping  4. Hyperlipidemia with target LDL less than 100  Does not really watch diet but says she never has been a fried food eater  5. Pathological fracture due to osteoporosis with routine healing, unspecified fracture site, unspecified osteoporosis type, subsequent encounter no c/o lower back pain- is no longer doing dexascans  6. Other osteoarthritis of spine, lumbar region  Says she is doing well. Just has a lot of stiffness  7. BMI 24.0-24.9, adult  Weight is up 4 lbs since last visit.    Outpatient Encounter Medications as of 08/02/2018  Medication Sig  . amLODipine-benazepril (LOTREL) 5-10 MG capsule Take 1 capsule by mouth daily.  . brimonidine (ALPHAGAN) 0.15 % ophthalmic solution Place 1 drop into both eyes 2 (two) times daily.  . Calcium Carbonate-Vitamin D (CALCIUM 600+D) 600-400 MG-UNIT per tablet Take 1 tablet by mouth daily.  . cephALEXin (KEFLEX) 500 MG capsule Take 1 capsule (500 mg total) by mouth 2 (two) times daily.  Marland Kitchen. donepezil (ARICEPT) 5 MG tablet Take 1 tablet (5 mg total) by mouth at bedtime.  . DORZOLAMIDE HCL-TIMOLOL MAL OP Apply 1 drop to eye 2 (two) times daily.  Marland Kitchen. latanoprost (XALATAN) 0.005 % ophthalmic solution Place 1 drop into the left eye at bedtime.  . megestrol (MEGACE) 20 MG tablet TAKE 1 TABLET DAILY  . Misc Natural Products (OSTEO BI-FLEX TRIPLE STRENGTH) TABS Take  4,000 each by mouth daily. Patient takes 2000 mg(2 tablets at one time daily)  . Multiple Vitamins-Minerals ([employer] SPECTRAVITE PO) Take 1 capsule by mouth daily.  . Omega-3 Fatty Acids (FISH OIL) 1200 MG CAPS Take 1,200 mg by mouth 3 (three) times daily.  . potassium chloride SA (K-DUR,KLOR-CON) 20 MEQ tablet Take 2 tablets (40 mEq total) by mouth daily.     New complaints: None today  Social history: Lives by herself. Family has some one come in and check on her daily.   Review of Systems  Constitutional: Negative for activity change and appetite change.  HENT: Negative.   Eyes: Negative for pain.  Respiratory: Negative for shortness of breath.   Cardiovascular: Negative for chest pain, palpitations and leg swelling.  Gastrointestinal: Negative for abdominal pain.  Endocrine: Negative for polydipsia.  Genitourinary: Negative.   Skin: Negative for rash.  Neurological: Negative for dizziness, weakness and headaches.  Hematological: Does not bruise/bleed easily.  Psychiatric/Behavioral: Negative.   All other systems reviewed and are negative.      Objective:   Physical Exam Vitals signs and nursing note reviewed.  Constitutional:      General: She is not in acute distress.    Appearance: Normal appearance. She is well-developed and normal weight.  HENT:     Head: Normocephalic.     Nose: Nose normal.  Eyes:     Pupils: Pupils are equal, round, and reactive to light.  Neck:  Musculoskeletal: Normal range of motion and neck supple.     Vascular: No carotid bruit or JVD.  Cardiovascular:     Rate and Rhythm: Normal rate and regular rhythm.     Heart sounds: Normal heart sounds.  Pulmonary:     Effort: Pulmonary effort is normal. No respiratory distress.     Breath sounds: Normal breath sounds. No wheezing or rales.  Chest:     Chest wall: No tenderness.  Abdominal:     General: Bowel sounds are normal. There is no distension or abdominal bruit.     Palpations:  Abdomen is soft. There is no hepatomegaly, splenomegaly, mass or pulsatile mass.     Tenderness: There is no abdominal tenderness.  Musculoskeletal: Normal range of motion.  Lymphadenopathy:     Cervical: No cervical adenopathy.  Skin:    General: Skin is warm and dry.  Neurological:     Mental Status: She is alert and oriented to person, place, and time.     Deep Tendon Reflexes: Reflexes are normal and symmetric.  Psychiatric:        Behavior: Behavior normal.        Thought Content: Thought content normal.        Judgment: Judgment normal.    BP 140/63   Pulse 78   Temp (!) 96.8 F (36 C) (Oral)   Ht 5\' 2"  (1.575 m)   Wt 153 lb (69.4 kg)   BMI 27.98 kg/m        Assessment & Plan:  Christina Delgado comes in today with chief complaint of Medical Management of Chronic Issues   Diagnosis and orders addressed:  1. Essential hypertension Low sodium diet - amLODipine-benazepril (LOTREL) 5-10 MG capsule; Take 1 capsule by mouth daily.  Dispense: 30 capsule; Refill: 5  2. Type 2 diabetes mellitus without complication, without long-term current use of insulin ([name]) Continue to watch carbs in diet  3. Hypokalemia - potassium chloride SA (K-DUR,KLOR-CON) 20 MEQ tablet; Take 2 tablets (40 mEq total) by mouth daily.  Dispense: 60 tablet; Refill: 5  4. Hyperlipidemia with target LDL less than 100 Low fat diet  5. Pathological fracture due to osteoporosis with routine healing, unspecified fracture site, unspecified osteoporosis type, subsequent encounter Walk with walker Fall precautions  6. Other osteoarthritis of spine, lumbar region  7. BMI 24.0-24.9, adult Discussed diet and exercise for person with BMI >25 Will recheck weight in 3-6 months  8. Memory loss [place] daily - donepezil (ARICEPT) 5 MG tablet; Take 1 tablet (5 mg total) by mouth at bedtime.  Dispense: 30 tablet; Refill: 5   Labs pending Health Maintenance reviewed Diet and exercise encouraged  Follow  up plan: 6 months   Christina Daphine Deutscher, FNP

## 2018-08-02 NOTE — Addendum Note (Signed)
Addended by: Bennie Pierini on: 08/02/2018 04:43 PM   Modules accepted: Orders

## 2018-08-02 NOTE — Patient Instructions (Signed)
Fall Prevention in the Home, Adult  Falls can cause injuries. They can happen to people of all ages. There are many things you can do to make your home safe and to help prevent falls. Ask for help when making these changes, if needed.  What actions can I take to prevent falls?  General Instructions  · Use good lighting in all rooms. Replace any light bulbs that burn out.  · Turn on the lights when you go into a dark area. Use night-lights.  · Keep items that you use often in easy-to-reach places. Lower the shelves around your home if necessary.  · Set up your furniture so you have a clear path. Avoid moving your furniture around.  · Do not have throw rugs and other things on the floor that can make you trip.  · Avoid walking on wet floors.  · If any of your floors are uneven, fix them.  · Add color or contrast paint or tape to clearly mark and help you see:  ? Any grab bars or handrails.  ? First and last steps of stairways.  ? Where the edge of each step is.  · If you use a stepladder:  ? Make sure that it is fully opened. Do not climb a closed stepladder.  ? Make sure that both sides of the stepladder are locked into place.  ? Ask someone to hold the stepladder for you while you use it.  · If there are any pets around you, be aware of where they are.  What can I do in the bathroom?         · Keep the floor dry. Clean up any water that spills onto the floor as soon as it happens.  · Remove soap buildup in the tub or shower regularly.  · Use non-skid mats or decals on the floor of the tub or shower.  · Attach bath mats securely with double-sided, non-slip rug tape.  · If you need to sit down in the shower, use a plastic, non-slip stool.  · Install grab bars by the toilet and in the tub and shower. Do not use towel bars as grab bars.  What can I do in the bedroom?  · Make sure that you have a light by your bed that is easy to reach.  · Do not use any sheets or blankets that are too big for your bed. They should  not hang down onto the floor.  · Have a firm chair that has side arms. You can use this for support while you get dressed.  What can I do in the kitchen?  · Clean up any spills right away.  · If you need to reach something above you, use a strong step stool that has a grab bar.  · Keep electrical cords out of the way.  · Do not use floor polish or wax that makes floors slippery. If you must use wax, use non-skid floor wax.  What can I do with my stairs?  · Do not leave any items on the stairs.  · Make sure that you have a light switch at the top of the stairs and the bottom of the stairs. If you do not have them, ask someone to add them for you.  · Make sure that there are handrails on both sides of the stairs, and use them. Fix handrails that are broken or loose. Make sure that handrails are as long as the stairways.  ·   Install non-slip stair treads on all stairs in your home.  · Avoid having throw rugs at the top or bottom of the stairs. If you do have throw rugs, attach them to the floor with carpet tape.  · Choose a carpet that does not hide the edge of the steps on the stairway.  · Check any carpeting to make sure that it is firmly attached to the stairs. Fix any carpet that is loose or worn.  What can I do on the outside of my home?  · Use bright outdoor lighting.  · Regularly fix the edges of walkways and driveways and fix any cracks.  · Remove anything that might make you trip as you walk through a door, such as a raised step or threshold.  · Trim any bushes or trees on the path to your home.  · Regularly check to see if handrails are loose or broken. Make sure that both sides of any steps have handrails.  · Install guardrails along the edges of any raised decks and porches.  · Clear walking paths of anything that might make someone trip, such as tools or rocks.  · Have any leaves, snow, or ice cleared regularly.  · Use sand or salt on walking paths during winter.  · Clean up any spills in your garage right  away. This includes grease or oil spills.  What other actions can I take?  · Wear shoes that:  ? Have a low heel. Do not wear high heels.  ? Have rubber bottoms.  ? Are comfortable and fit you well.  ? Are closed at the toe. Do not wear open-toe sandals.  · Use tools that help you move around (mobility aids) if they are needed. These include:  ? Canes.  ? Walkers.  ? Scooters.  ? Crutches.  · Review your medicines with your doctor. Some medicines can make you feel dizzy. This can increase your chance of falling.  Ask your doctor what other things you can do to help prevent falls.  Where to find more information  · Centers for Disease Control and Prevention, [employer]: https://cdc.gov  · National Institute on Aging: https://go4life.nia.nih.gov  Contact a doctor if:  · You are afraid of falling at home.  · You feel weak, drowsy, or dizzy at home.  · You fall at home.  Summary  · There are many simple things that you can do to make your home safe and to help prevent falls.  · Ways to make your home safe include removing tripping hazards and installing grab bars in the bathroom.  · Ask for help when making these changes in your home.  This information is not intended to replace advice given to you by your health care provider. Make sure you discuss any questions you have with your health care provider.  Document Released: 04/08/2009 Document Revised: 01/25/2017 Document Reviewed: 01/25/2017  Elsevier Interactive Patient Education © 2019 [employer] Inc.

## 2018-08-02 NOTE — Addendum Note (Signed)
Addended by: Leona Pressly, MARY-MARGARET on: 08/02/2018 04:44 PM   Modules accepted: Orders  

## 2018-08-03 LAB — CMP14+EGFR
A/G RATIO: 1.4 (ref 1.2–2.2)
ALT: 21 IU/L (ref 0–32)
AST: 25 IU/L (ref 0–40)
Albumin: 4.3 g/dL (ref 3.6–4.6)
Alkaline Phosphatase: 58 IU/L (ref 39–117)
BUN/Creatinine Ratio: 15 (ref 12–28)
BUN: 12 mg/dL (ref 8–27)
CALCIUM: 9.5 mg/dL (ref 8.7–10.3)
CHLORIDE: 107 mmol/L — AB (ref 96–106)
CO2: 22 mmol/L (ref 20–29)
Creatinine, Ser: 0.78 mg/dL (ref 0.57–1.00)
GFR calc Af Amer: 80 mL/min/{1.73_m2} (ref >59)
GFR calc non Af Amer: 70 mL/min/{1.73_m2} (ref >59)
Globulin, Total: 3.1 g/dL (ref 1.5–4.5)
Glucose: 123 mg/dL — ABNORMAL HIGH (ref 65–99)
Potassium: 4 mmol/L (ref 3.5–5.2)
Sodium: 142 mmol/L (ref 134–144)
Total Protein: 7.4 g/dL (ref 6.0–8.5)

## 2018-08-03 LAB — LIPID PANEL
Chol/HDL Ratio: 2.9 ratio (ref 0.0–4.4)
Cholesterol, Total: 130 mg/dL (ref 100–199)
HDL: 45 mg/dL (ref >39)
LDL Calculated: 69 mg/dL (ref 0–99)
TRIGLYCERIDES: 81 mg/dL (ref 0–149)
VLDL Cholesterol Cal: 16 mg/dL (ref 5–40)

## 2018-08-17 ENCOUNTER — Encounter: Payer: Self-pay | Admitting: Podiatry

## 2018-08-17 ENCOUNTER — Ambulatory Visit: Payer: Medicare Other | Admitting: Podiatry

## 2018-08-17 DIAGNOSIS — B351 Tinea unguium: Secondary | ICD-10-CM | POA: Diagnosis not present

## 2018-08-17 DIAGNOSIS — M79674 Pain in right toe(s): Secondary | ICD-10-CM

## 2018-08-17 DIAGNOSIS — M79675 Pain in left toe(s): Secondary | ICD-10-CM | POA: Diagnosis not present

## 2018-08-17 NOTE — Progress Notes (Addendum)
Complaint:  Visit Type: Patient presents  to my office for  preventative foot care services. Complaint: Patient states" my nails have grown long and thick and become painful to walk and wear shoes" Patient presents the office today with her daughter for treatment. The patient presents for preventative foot care services. No changes to ROS.  Her daughter claims she is not diabetic.  Podiatric Exam: Vascular: dorsalis pedis  are palpable bilateral.   Posterior tibial pulses are not palpable  B/L.  Capillary return is immediate. Temperature gradient is WNL. Skin turgor WNL  Sensorium: Normal Semmes Weinstein monofilament test. Normal tactile sensation bilaterally. Nail Exam: Pt has thick disfigured discolored nails with subungual debris noted bilateral entire nail hallux through fifth toenails Ulcer Exam: There is no evidence of ulcer or pre-ulcerative changes or infection. Orthopedic Exam: Muscle tone and strength are WNL. No limitations in general ROM. No crepitus or effusions noted. Foot type and digits show no abnormalities. Bony prominences are unremarkable. Skin: No Porokeratosis. No infection or ulcers.   Dry peeling skin.  Diagnosis:  Onychomycosis, , Pain in right toe, pain in left toes  Treatment & Plan Procedures and Treatment: Consent by patient was obtained for treatment procedures.   Debridement of mycotic and hypertrophic toenails, 1 through 5 bilateral and clearing of subungual debris. No ulceration, no infection noted.  Return Visit-Office Procedure: Patient instructed to return to the office for a follow up visit 3 months for continued evaluation and treatment. Use moisturizer for feet.    Helane Gunther DPM

## 2018-08-28 ENCOUNTER — Other Ambulatory Visit: Payer: Self-pay | Admitting: Nurse Practitioner

## 2018-08-28 DIAGNOSIS — R634 Abnormal weight loss: Secondary | ICD-10-CM

## 2018-10-25 ENCOUNTER — Other Ambulatory Visit: Payer: Self-pay | Admitting: Nurse Practitioner

## 2018-10-25 DIAGNOSIS — R634 Abnormal weight loss: Secondary | ICD-10-CM

## 2018-11-23 ENCOUNTER — Ambulatory Visit: Payer: Medicare Other | Admitting: Podiatry

## 2018-11-25 ENCOUNTER — Other Ambulatory Visit: Payer: Self-pay | Admitting: Nurse Practitioner

## 2018-11-25 DIAGNOSIS — R634 Abnormal weight loss: Secondary | ICD-10-CM

## 2018-12-24 ENCOUNTER — Other Ambulatory Visit: Payer: Self-pay | Admitting: Nurse Practitioner

## 2018-12-24 DIAGNOSIS — R634 Abnormal weight loss: Secondary | ICD-10-CM

## 2019-01-11 ENCOUNTER — Emergency Department (HOSPITAL_BASED_OUTPATIENT_CLINIC_OR_DEPARTMENT_OTHER): Payer: Medicare Other

## 2019-01-11 ENCOUNTER — Other Ambulatory Visit: Payer: Self-pay

## 2019-01-11 ENCOUNTER — Emergency Department (HOSPITAL_BASED_OUTPATIENT_CLINIC_OR_DEPARTMENT_OTHER)
Admission: EM | Admit: 2019-01-11 | Discharge: 2019-01-11 | Disposition: A | Discharge status: Home / Self Care | Payer: Medicare Other | Attending: Emergency Medicine | Admitting: Emergency Medicine

## 2019-01-11 ENCOUNTER — Encounter (HOSPITAL_BASED_OUTPATIENT_CLINIC_OR_DEPARTMENT_OTHER): Payer: Self-pay | Admitting: Emergency Medicine

## 2019-01-11 DIAGNOSIS — R4689 Other symptoms and signs involving appearance and behavior: Secondary | ICD-10-CM | POA: Insufficient documentation

## 2019-01-11 DIAGNOSIS — G253 Myoclonus: Secondary | ICD-10-CM | POA: Insufficient documentation

## 2019-01-11 DIAGNOSIS — G479 Sleep disorder, unspecified: Secondary | ICD-10-CM | POA: Diagnosis not present

## 2019-01-11 DIAGNOSIS — R531 Weakness: Secondary | ICD-10-CM | POA: Diagnosis present

## 2019-01-11 DIAGNOSIS — I10 Essential (primary) hypertension: Secondary | ICD-10-CM | POA: Insufficient documentation

## 2019-01-11 LAB — URINALYSIS, ROUTINE W REFLEX MICROSCOPIC
Bilirubin Urine: NEGATIVE
Glucose, UA: NEGATIVE mg/dL
Hgb urine dipstick: NEGATIVE
Ketones, ur: NEGATIVE mg/dL
Leukocytes,Ua: NEGATIVE
Nitrite: NEGATIVE
Protein, ur: NEGATIVE mg/dL
Specific Gravity, Urine: 1.015 (ref 1.005–1.030)
pH: 8.5 — ABNORMAL HIGH (ref 5.0–8.0)

## 2019-01-11 LAB — BASIC METABOLIC PANEL
Anion gap: 11 (ref 5–15)
BUN: 8 mg/dL (ref 8–23)
CO2: 23 mmol/L (ref 22–32)
Calcium: 9.3 mg/dL (ref 8.9–10.3)
Chloride: 108 mmol/L (ref 98–111)
Creatinine, Ser: 0.8 mg/dL (ref 0.44–1.00)
GFR calc Af Amer: >60 mL/min (ref >60)
GFR calc non Af Amer: >60 mL/min (ref >60)
Glucose, Bld: 108 mg/dL — ABNORMAL HIGH (ref 70–99)
Potassium: 3.5 mmol/L (ref 3.5–5.1)
Sodium: 142 mmol/L (ref 135–145)

## 2019-01-11 LAB — CBG MONITORING, ED: Glucose-Capillary: 95 mg/dL (ref 70–99)

## 2019-01-11 LAB — CBC
HCT: 37.2 % (ref 36.0–46.0)
Hemoglobin: 11.7 g/dL — ABNORMAL LOW (ref 12.0–15.0)
MCH: 29.3 pg (ref 26.0–34.0)
MCHC: 31.5 g/dL (ref 30.0–36.0)
MCV: 93 fL (ref 80.0–100.0)
Platelets: 162 10*3/uL (ref 150–400)
RBC: 4 MIL/uL (ref 3.87–5.11)
RDW: 13.2 % (ref 11.5–15.5)
WBC: 6.5 10*3/uL (ref 4.0–10.5)
nRBC: 0 % (ref 0.0–0.2)

## 2019-01-11 MED ORDER — TRAZODONE HCL 50 MG PO TABS: 50.0000 mg | ORAL_TABLET | Freq: Every day | ORAL | 0 refills | Status: DC

## 2019-01-11 MED ORDER — SODIUM CHLORIDE 0.9 % IV BOLUS
500.0000 mL | Freq: Once | INTRAVENOUS | Status: AC
  Administered 2019-01-11: 500 mL via INTRAVENOUS

## 2019-01-11 NOTE — ED Notes (Signed)
Purewick was placed on patient and sheets were changed due to being soiled.

## 2019-01-11 NOTE — ED Notes (Signed)
2 person assist needed to help pt into POV. Family confirmed that they will have two people and walker to assist patient on arrival at residence.

## 2019-01-11 NOTE — ED Triage Notes (Signed)
Per daughter, pt c/o "twitching" and weakness since this morning. Denies cough, N/V.

## 2019-01-11 NOTE — ED Provider Notes (Signed)
[facility] Emergency Department Provider Note MRN:  [id number]  Arrival date & time: 01/11/19     Chief Complaint   Weakness   History of Present Illness   Christina Delgado is a 85 y.o. year-old female with a history of diabetes, dementia presenting to the ED with chief complaint of weakness.  Increased weakness and somnolence for the past 2 days.  Patient has had recent cataract surgery and has been not sleeping for the past 2 nights.  History obtained from daughter, who denies recent fever, no recent cough, patient not complaining of any chest pain or abdominal pain, no shortness of breath.  Patient has also been exhibiting these intermittent tremulous motions often for the past 2 days.  I was unable to obtain an accurate HPI, PMH, or ROS due to the patient's dementia, altered mental status.  Review of Systems  A complete 10 system review of systems was obtained and all systems are negative except as noted in the HPI and PMH.   Patient's Health History    Past Medical History:  Diagnosis Date  . Ankle fracture, left   . Generalized osteoarthrosis, involving multiple sites   . Glaucoma   . Hepatitis, unspecified   . Hypertension   . Hypopotassemia   . Osteoporosis     Past Surgical History:  Procedure Laterality Date  . FRACTURE SURGERY Left       . REFRACTIVE SURGERY      Family History  Problem Relation Age of Onset  . Stroke Father 1290  . Diabetes Sister   . Stroke Mother 5158  . Diabetes Mother   . Stroke Brother   . Hypertension Sister   . Hyperlipidemia Sister   . Cancer Brother        LUNG  . Diabetes Brother     Social History   Socioeconomic History  . Marital status: Married    Spouse name: Not on file  . Number of children: 4  . Years of education: 16  . Highest education level: Bachelor's degree (e.g., BA, AB, BS)  Occupational History  . Occupation: Magazine features editorTeacher    [employer]er: [employer]    Comment: Retired   Engineer, productionocial Needs  . Financial resource strain: Not hard at all  . Food insecurity    Worry: Never true    Inability: Never true  . Transportation needs    Medical: No    Non-medical: No  Tobacco Use  . Smoking status: Never Smoker  . Smokeless tobacco: Never Used  Substance and Sexual Activity  . Alcohol use: No  . Drug use: No  . Sexual activity: Not Currently  Lifestyle  . Physical activity    Days per week: 7 days    Minutes per session: 30 min  . Stress: Only a little  Relationships  . Social connections    Talks on phone: More than three times a week    Gets together: More than three times a week    Attends religious service: More than 4 times per year    Active member of club or organization: Yes    Attends meetings of clubs or organizations: More than 4 times per year    Relationship status: Widowed  . Intimate partner violence    Fear of current or ex partner: Not on file    Emotionally abused: Not on file    Physically abused: Not on file    Forced sexual activity: Not on file  Other Topics  Concern  . Not on file  Social History Narrative  . Not on file     Physical Exam  Vital Signs and Nursing Notes reviewed Vitals:   01/11/19 1326 01/11/19 1400  BP: (!) 160/114 (!) 156/71  Pulse: 90 72  Resp: 13 15  Temp:    SpO2: 100% 99%    CONSTITUTIONAL: Well-appearing, NAD NEURO: Somnolent, wakes to voice, oriented to name, no focal neurological deficits EYES:  eyes equal and reactive ENT/NECK:  no LAD, no JVD CARDIO: Regular rate, well-perfused, normal S1 and S2 PULM:  CTAB no wheezing or rhonchi GI/GU:  normal bowel sounds, non-distended, non-tender MSK/SPINE:  No gross deformities, no edema SKIN:  no rash, atraumatic PSYCH:  Appropriate speech and behavior  Diagnostic and Interventional Summary    EKG Interpretation  Date/Time:  Saturday January 11 2019 11:23:16 EDT Ventricular Rate:  69 PR Interval:    QRS Duration: 87 QT Interval:  393 QTC  Calculation: 421 R Axis:   -36 Text Interpretation:  Sinus rhythm Left axis deviation Abnormal R-wave progression, early transition Confirmed by Gerlene Fee [phone]) on 01/11/2019 11:36:01 AM Also confirmed by Gerlene Fee [phone]), editor Philomena Doheny [phone])  on 01/11/2019 2:19:07 PM      Labs Reviewed  BASIC METABOLIC PANEL - Abnormal; Notable for the following components:      Result Value   Glucose, Bld 108 (*)    All other components within normal limits  CBC - Abnormal; Notable for the following components:   Hemoglobin 11.7 (*)    All other components within normal limits  URINALYSIS, ROUTINE W REFLEX MICROSCOPIC - Abnormal; Notable for the following components:   pH 8.5 (*)    All other components within normal limits  CULTURE, BLOOD (SINGLE)  CBG MONITORING, ED    CT Head Wo Contrast  Final Result    DG Chest Port 1 View  Final Result      Medications  sodium chloride 0.9 % bolus 500 mL (0 mLs Intravenous Stopped 01/11/19 1303)     Procedures Critical Care  ED Course and Medical Decision Making  I have reviewed the triage vital signs and the nursing notes.  Pertinent labs & imaging results that were available during my care of the patient were reviewed by me and considered in my medical decision making (see below for details).  Considering metabolic disarray, UTI, subdural hematoma in this 85 year old female with increased somnolence, tremulous behavior.  Patient is 99.2 degrees rectally, also considering sepsis and rigors, will obtain blood culture and await laboratory evaluation.  Work-up is unrevealing, nonacute CT head, normal urinalysis, normal blood work.  Patient is exhibited no signs of sepsis during her entire time here in the emergency department, no fever, no white count.  Suspect myoclonic jerking as the reason for her behavior especially given her poor sleep for the past 2 nights.  Daughter requesting something to aid in the return to her normal sleep  cycle, will provide short course trazodone to be taken nightly.  I requested that someone stay with her at night if we are going to try this new medication for sleep.  Daughter agrees. Close PCP follow-up.  After the discussed management above, the patient was determined to be safe for discharge.  The patient was in agreement with this plan and all questions regarding their care were answered.  ED return precautions were discussed and the patient will return to the ED with any significant worsening of condition.  Barth Kirks. Sedonia Small, MD  [facility] [email or web address]  Final Clinical Impressions(s) / ED Diagnoses     ICD-10-CM   1. Myoclonic jerking  G25.3   2. Altered behavior  R46.89 DG [facility]    DG Chest Port 1 View  3. Sleep disturbance  G47.9     ED Discharge Orders         Ordered    traZODone (DESYREL) 50 MG tablet  Daily at bedtime     01/11/19 1443             Sabas SousBero, Michael M, MD 01/11/19 1453

## 2019-01-11 NOTE — ED Notes (Signed)
ED Provider at bedside. 

## 2019-01-11 NOTE — Discharge Instructions (Addendum)
You were evaluated in the Emergency Department and after careful evaluation, we did not find any emergent condition requiring admission or further testing in the hospital.  Your symptoms today seem to be due to myoclonic jerking due to sleep disturbance.  Your testing today was overall very reassuring.  You can try the trazodone medication before bed to aid with returning to a normal sleep cycle.  If we are going to try this medication at home we do recommend that someone stay with her at night.  Please return to the Emergency Department if you experience any worsening of your condition.  We encourage you to follow up with a primary care provider.  Thank you for allowing [place] to be a part of your care.

## 2019-01-11 NOTE — ED Notes (Signed)
Contact # for Christina Delgado [phone] (pt's daughter). Pt alert, watching TV. Purewick in place draining clear yellow urine

## 2019-01-16 LAB — CULTURE, BLOOD (SINGLE)
Culture: NO GROWTH
Special Requests: ADEQUATE

## 2019-01-22 ENCOUNTER — Other Ambulatory Visit: Payer: Self-pay | Admitting: Nurse Practitioner

## 2019-01-22 ENCOUNTER — Telehealth: Payer: Self-pay | Admitting: Nurse Practitioner

## 2019-01-22 DIAGNOSIS — R634 Abnormal weight loss: Secondary | ICD-10-CM

## 2019-01-22 DIAGNOSIS — R413 Other amnesia: Secondary | ICD-10-CM

## 2019-01-22 NOTE — Telephone Encounter (Signed)
Wants a referral for Forgetfulness to Dr. Merlene Laughter.

## 2019-01-31 ENCOUNTER — Ambulatory Visit: Payer: Medicare Other | Admitting: Nurse Practitioner

## 2019-02-17 ENCOUNTER — Ambulatory Visit (INDEPENDENT_AMBULATORY_CARE_PROVIDER_SITE_OTHER): Payer: Medicare Other | Admitting: Family Medicine

## 2019-02-17 ENCOUNTER — Encounter: Payer: Self-pay | Admitting: Family Medicine

## 2019-02-17 ENCOUNTER — Other Ambulatory Visit: Payer: Self-pay

## 2019-02-17 VITALS — BP 135/65 | HR 69 | Temp 98.4°F | Ht 62.0 in | Wt 166.0 lb

## 2019-02-17 DIAGNOSIS — I10 Essential (primary) hypertension: Secondary | ICD-10-CM

## 2019-02-17 DIAGNOSIS — F0390 Unspecified dementia without behavioral disturbance: Secondary | ICD-10-CM | POA: Diagnosis not present

## 2019-02-17 DIAGNOSIS — Z7409 Other reduced mobility: Secondary | ICD-10-CM

## 2019-02-17 DIAGNOSIS — N39498 Other specified urinary incontinence: Secondary | ICD-10-CM

## 2019-02-17 DIAGNOSIS — E119 Type 2 diabetes mellitus without complications: Secondary | ICD-10-CM | POA: Diagnosis not present

## 2019-02-17 LAB — BAYER DCA HB A1C WAIVED: HB A1C (BAYER DCA - WAIVED): 6.2 % (ref <7.0)

## 2019-02-17 NOTE — Progress Notes (Addendum)
Subjective: CC: est care, DM2, incontinence/ Dementia PCP: Raliegh IpGottschalk, Chrisette Man M, DO ZOX:WRUEHPI:Christina Delgado is a 85 y.o. female presenting to clinic today for:  1. DM2 with hypertension and hyperlipidemia Patient has diet-controlled type 2 diabetes.  She is on Lotrel 5-10 mg daily for blood pressure control.  Her daughter also notes that she tries to make sure she has a low-salt diet.  Patient is not on any medications for cholesterol.  She has a history of hepatitis in the past.  No chest pain, shortness of breath.  She does have lower extremity edema occasionally.  Her daughter also feels like she has an occasional wheeze that is expiratory.  No known orthopnea.  No known heart failure.  2.  Dementia/incontinence Patient with known dementia.  Her neurologist, Dr. Gerilyn Pilgrimoonquah, recently increased her Aricept to 10 mg daily.  She does report that she seems to be a little bit more sleepy than normal but this seems to be getting better over the last week.  Her mother typically is ambulatory with assistance and resides independently.  She does have help during the day and is monitored via camera systems at nighttime.  No known falls.  She is incontinent of urine at nighttime and her daughter would like us to arrange a bedside commode and incontinence supplies.  She would also like to get a prescription for a wheelchair as there is quite a bit of difficulty getting the patient to and from appointments and around the community.  At baseline her mental status is that she has good long-term memory but has difficulty with short-term memory.  Her healthcare power of attorney is Dixon BoosSandra Tripoli, her daughter.  Her other power of attorney's include her other children who reside in [place], [place] and [place].  There is also another daughter who lives in [place].   ROS: Per HPI  No Known Allergies Past Medical History:  Diagnosis Date  . Ankle fracture, left   . Generalized osteoarthrosis, involving  multiple sites   . Glaucoma   . Hepatitis, unspecified   . Hypertension   . Hypopotassemia   . Osteoporosis     Current Outpatient Medications:  .  amLODipine-benazepril (LOTREL) 5-10 MG capsule, Take 1 capsule by mouth daily., Disp: 30 capsule, Rfl: 5 .  brimonidine (ALPHAGAN) 0.15 % ophthalmic solution, Place 1 drop into both eyes 2 (two) times daily., Disp: , Rfl:  .  Calcium Carbonate-Vitamin D (CALCIUM 600+D) 600-400 MG-UNIT per tablet, Take 1 tablet by mouth daily., Disp: , Rfl:  .  donepezil (ARICEPT) 5 MG tablet, Take 1 tablet (5 mg total) by mouth at bedtime. (Patient taking differently: Take 10 mg by mouth at bedtime. ), Disp: 30 tablet, Rfl: 5 .  DORZOLAMIDE HCL-TIMOLOL MAL OP, Apply 1 drop to eye 2 (two) times daily., Disp: , Rfl:  .  latanoprost (XALATAN) 0.005 % ophthalmic solution, Place 1 drop into the left eye at bedtime., Disp: , Rfl:  .  Misc Natural Products (OSTEO BI-FLEX TRIPLE STRENGTH) TABS, Take 4,000 each by mouth daily. Patient takes 2000 mg(2 tablets at one time daily), Disp: , Rfl:  .  Multiple Vitamins-Minerals (CVS SPECTRAVITE PO), Take 1 capsule by mouth daily., Disp: , Rfl:  .  Omega-3 Fatty Acids (FISH OIL) 1200 MG CAPS, Take 1,200 mg by mouth 3 (three) times daily., Disp: , Rfl:  .  potassium chloride SA (K-DUR,KLOR-CON) 20 MEQ tablet, Take 2 tablets (40 mEq total) by mouth daily., Disp: 60 tablet, Rfl: 5 Social History  Socioeconomic History  . Marital status: Married    Spouse name: Not on file  . Number of children: 4  . Years of education: 16  . Highest education level: Bachelor's degree (e.g., BA, AB, BS)  Occupational History  . 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 Financial resource strain: Not hard at all  . Food insecurity    Worry: Never true    Inability: Never true  . Transportation needs    Medical: No    Non-medical: No  Tobacco Use  . Smoking status: Never Smoker  . Smokeless  tobacco: Never Used  Substance and Sexual Activity  . Alcohol use: No  . Drug use: No  . Sexual activity: Not Currently  Lifestyle  . Physical activity    Days per week: 7 days    Minutes per session: 30 min  . Stress: Only a little  Relationships  . Social connections    Talks on phone: More than three times a week    Gets together: More than three times a week    Attends religious service: More than 4 times per year    Active member of club or organization: Yes    Attends meetings of clubs or organizations: More than 4 times per year    Relationship status: Widowed  . Intimate partner violence    Fear of current or ex partner: Not on file    Emotionally abused: Not on file    Physically abused: Not on file    Forced sexual activity: Not on file  Other Topics Concern  . Not on file  Social History Narrative  . Not on file   Family History  Problem Relation Age of Onset  . Stroke Father 62  . Diabetes Sister   . Stroke Mother 24  . Diabetes Mother   . Stroke Brother   . Hypertension Sister   . Hyperlipidemia Sister   . Cancer Brother        LUNG  . Diabetes Brother     Objective: Office vital signs reviewed. BP 135/65   Pulse 69   Temp 98.4 F (36.9 C) (Temporal)   Ht 5\' 2"  (1.575 m)   Wt 166 lb (75.3 kg)   BMI 30.36 kg/m   Physical Examination:  General: Awake, alert, well nourished, No acute distress HEENT: Normal, sclera white. Cardio: regular rate and rhythm, S1S2 heard, no murmurs appreciated Pulm: clear to auscultation bilaterally, no wheezes, rhonchi or rales; normal work of breathing on room air Extremities: warm, well perfused, pedal edema noted. No cyanosis or clubbing; +2 pulses bilaterally MSK: arrives in wheelchair Skin: fungal changes to the feet noted Neuro:  See DM foot; appears sleepy but arouses easily.  Responds to questions appropriately.  Diabetic Foot Exam - Simple   Simple Foot Form Diabetic Foot exam was performed with the  following findings: Yes 02/17/2019  5:48 PM  Visual Inspection See comments: Yes Sensation Testing Intact to touch and monofilament testing bilaterally: Yes Pulse Check Posterior Tibialis and Dorsalis pulse intact bilaterally: Yes Comments She has mild pedal edema bilaterally.  Onychomycotic changes to the skin and nails noted.  She has thickening of the great toenails bilaterally.    Results for orders placed or performed in visit on 02/17/19 (from the past 24 hour(s))  Bayer DCA Hb A1c Waived     Status: None   Collection Time: 02/17/19  3:52 PM  Result Value  Ref Range   HB A1C (BAYER DCA - WAIVED) 6.2 <7.0 %   Narrative   Performed at:  [address] - [facility] [place] [address], [place], [place]  [id number] Lab Director: Rockie Neighboursatricia [facility], Phone:  [phone]   Assessment/ Plan: 85 y.o. female   1. Impaired mobility and ADLs DME [facility] for bedside commode and incontinent supplies.  I have placed [facility] in efforts to have her evaluated for a wheelchair. - DME Bedside commode - Incontinence supply - Ambulatory referral to Occupational Therapy  2. Dementia without behavioral disturbance, unspecified dementia type ([name]) Aricept increased to 10mg  by neuro.  Monitor sleepiness.  If no improvement, recommend reduction back to 5mg . - DME Bedside commode - Incontinence supply - Ambulatory referral to Occupational Therapy  3. Type 2 diabetes mellitus without complication, without long-term current use of insulin ([name]) Under excellent control through diet alone.  A1c today 6.2.  Diabetic foot exam performed.  Needs eye exam. - Bayer DCA Hb A1c Waived  4. Essential hypertension Controlled.  Continue current regimen.  No refills needed  5. Other urinary incontinence - Incontinence supply   Orders Placed This Encounter  Procedures  . Bayer DCA Hb A1c Waived   No orders of the defined types were placed in  this encounter.   **Addendum: Patient in fact did not need a formal wheelchair fitting because she will be having a manual wheelchair for transfers.  A cane or crutch will not resolve the issue with mobility and needs of activities of daily living.  I am recommending a wheelchair for safety.  I believe the patient could safely propel herself within a manual wheelchair at home and also has her daughter, her caregiver, who can provide assistance.  Her length of need will be lifetime.  Please provide her the following accessories: Leg rest for elevation, wheel locks, extensions and anti-trippers. Raliegh IpAshly M Nekeshia Lenhardt, DO [facility] [phone]

## 2019-03-14 ENCOUNTER — Encounter: Payer: Self-pay | Admitting: Family Medicine

## 2019-03-19 ENCOUNTER — Other Ambulatory Visit: Payer: Self-pay

## 2019-03-20 ENCOUNTER — Ambulatory Visit: Payer: Medicare Other

## 2019-03-26 ENCOUNTER — Encounter: Payer: Self-pay | Admitting: Family Medicine

## 2019-03-26 DIAGNOSIS — Z7409 Other reduced mobility: Secondary | ICD-10-CM

## 2019-03-26 DIAGNOSIS — F0390 Unspecified dementia without behavioral disturbance: Secondary | ICD-10-CM

## 2019-03-26 DIAGNOSIS — Z789 Other specified health status: Secondary | ICD-10-CM

## 2019-04-21 ENCOUNTER — Other Ambulatory Visit: Payer: Self-pay | Admitting: Nurse Practitioner

## 2019-04-21 DIAGNOSIS — E876 Hypokalemia: Secondary | ICD-10-CM

## 2019-05-16 ENCOUNTER — Encounter: Payer: Self-pay | Admitting: Family Medicine

## 2019-05-21 ENCOUNTER — Other Ambulatory Visit: Payer: Self-pay | Admitting: Nurse Practitioner

## 2019-05-21 DIAGNOSIS — E876 Hypokalemia: Secondary | ICD-10-CM

## 2019-06-18 ENCOUNTER — Other Ambulatory Visit: Payer: Self-pay | Admitting: Nurse Practitioner

## 2019-06-18 DIAGNOSIS — I10 Essential (primary) hypertension: Secondary | ICD-10-CM

## 2019-07-03 DIAGNOSIS — M79676 Pain in unspecified toe(s): Secondary | ICD-10-CM | POA: Diagnosis not present

## 2019-07-03 DIAGNOSIS — B351 Tinea unguium: Secondary | ICD-10-CM | POA: Diagnosis not present

## 2019-07-04 DIAGNOSIS — H6123 Impacted cerumen, bilateral: Secondary | ICD-10-CM | POA: Diagnosis not present

## 2019-07-25 DIAGNOSIS — H903 Sensorineural hearing loss, bilateral: Secondary | ICD-10-CM | POA: Diagnosis not present

## 2019-07-25 DIAGNOSIS — H6122 Impacted cerumen, left ear: Secondary | ICD-10-CM | POA: Diagnosis not present

## 2019-07-30 ENCOUNTER — Encounter: Payer: Self-pay | Admitting: Family Medicine

## 2019-07-31 ENCOUNTER — Other Ambulatory Visit: Payer: Self-pay

## 2019-07-31 ENCOUNTER — Ambulatory Visit (INDEPENDENT_AMBULATORY_CARE_PROVIDER_SITE_OTHER): Payer: Medicare PPO | Admitting: Family Medicine

## 2019-07-31 DIAGNOSIS — R3989 Other symptoms and signs involving the genitourinary system: Secondary | ICD-10-CM

## 2019-07-31 MED ORDER — CEPHALEXIN 500 MG PO CAPS: 500.0000 mg | ORAL_CAPSULE | Freq: Two times a day (BID) | ORAL | 0 refills | Status: AC

## 2019-07-31 NOTE — Progress Notes (Signed)
Telephone visit  Subjective: CC: UTI PCP: Raliegh Ip, DO DUK:GURK Christina Delgado is a 86 y.o. female calls for telephone consult today. Patient provides verbal consent for consult held via phone.  Due to COVID-19 pandemic this visit was conducted virtually. This visit type was conducted due to national recommendations for restrictions regarding the COVID-19 Pandemic (e.g. social distancing, sheltering in place) in an effort to limit this patient's exposure and mitigate transmission in our community. All issues noted in this document were discussed and addressed.  A physical exam was not performed with this format.   Location of patient: home Location of provider: Working remotely from home Others present for call: Daughter, [name] (present on a 3 way call); Aurea Graff caregiver is also present  1. Urinary symptoms Patient reports several day h/o dysuria, urinary frequency, urgency.  Her daughter reports dark urine, urinary odor and some mild confusion.  She has been sleeping a bit more than usual.  Does not report hematuria, fevers, chills, abdominal pain, nausea, vomiting, back pain, vaginal discharge.  Patient is not drinking as much as usual.    ROS: Per HPI  No Known Allergies Past Medical History:  Diagnosis Date  . Ankle fracture, left   . Generalized osteoarthrosis, involving multiple sites   . Glaucoma   . Hepatitis, unspecified   . Hypertension   . Hypopotassemia   . Osteoporosis     Current Outpatient Medications:  .  amLODipine-benazepril (LOTREL) 5-10 MG capsule, TAKE 1 CAPSULE DAILY, Disp: 90 capsule, Rfl: 0 .  brimonidine (ALPHAGAN) 0.15 % ophthalmic solution, Place 1 drop into both eyes 2 (two) times daily., Disp: , Rfl:  .  Calcium Carbonate-Vitamin D (CALCIUM 600+D) 600-400 MG-UNIT per tablet, Take 1 tablet by mouth daily., Disp: , Rfl:  .  donepezil (ARICEPT) 5 MG tablet, Take 1 tablet (5 mg total) by mouth at bedtime. (Patient taking differently: Take 10 mg by  mouth at bedtime. ), Disp: 30 tablet, Rfl: 5 .  DORZOLAMIDE HCL-TIMOLOL MAL OP, Apply 1 drop to eye 2 (two) times daily., Disp: , Rfl:  .  latanoprost (XALATAN) 0.005 % ophthalmic solution, Place 1 drop into the left eye at bedtime., Disp: , Rfl:  .  Misc Natural Products (OSTEO BI-FLEX TRIPLE STRENGTH) TABS, Take 4,000 each by mouth daily. Patient takes 2000 mg(2 tablets at one time daily), Disp: , Rfl:  .  Multiple Vitamins-Minerals (CVS SPECTRAVITE PO), Take 1 capsule by mouth daily., Disp: , Rfl:  .  Omega-3 Fatty Acids (FISH OIL) 1200 MG CAPS, Take 1,200 mg by mouth 3 (three) times daily., Disp: , Rfl:  .  potassium chloride SA (KLOR-CON) 20 MEQ tablet, TAKE 2 TABLETS BY MOUTH DAILY., Disp: 60 tablet, Rfl: 2  Assessment/ Plan: 86 y.o. female   1. Suspected UTI I have a high suspicion of acute cystitis.  I am going to empirically treat her with Keflex 500 mg twice daily for the next 7 days.  We discussed pushing oral fluids and possibly the use of popsicles if she is not willing to drink regular fluids.  We discussed reasons for reevaluation.  Her daughter voiced good understanding and will follow up as needed. - cephALEXin (KEFLEX) 500 MG capsule; Take 1 capsule (500 mg total) by mouth 2 (two) times daily for 7 days.  Dispense: 14 capsule; Refill: 0   Start time: 11:46am End time: 11:55am  Total time spent on patient care (including telephone call/ virtual visit): 18 minutes  Ashly M Gottschalk, DO [facility] [phone]

## 2019-07-31 NOTE — Patient Instructions (Signed)
Urinary Tract Infection, Adult A urinary tract infection (UTI) is an infection of any part of the urinary tract. The urinary tract includes:  The kidneys.  The ureters.  The bladder.  The urethra. These organs make, store, and get rid of pee (urine) in the body. What are the causes? This is caused by germs (bacteria) in your genital area. These germs grow and cause swelling (inflammation) of your urinary tract. What increases the risk? You are more likely to develop this condition if:  You have a small, thin tube (catheter) to drain pee.  You cannot control when you pee or poop (incontinence).  You are female, and: ? You use these methods to prevent pregnancy:  A medicine that kills sperm (spermicide).  A device that blocks sperm (diaphragm). ? You have low levels of a female hormone (estrogen). ? You are pregnant.  You have genes that add to your risk.  You are sexually active.  You take antibiotic medicines.  You have trouble peeing because of: ? A prostate that is bigger than normal, if you are female. ? A blockage in the part of your body that drains pee from the bladder (urethra). ? A kidney stone. ? A nerve condition that affects your bladder (neurogenic bladder). ? Not getting enough to drink. ? Not peeing often enough.  You have other conditions, such as: ? Diabetes. ? A weak disease-fighting system (immune system). ? Sickle cell disease. ? Gout. ? Injury of the spine. What are the signs or symptoms? Symptoms of this condition include:  Needing to pee right away (urgently).  Peeing often.  Peeing small amounts often.  Pain or burning when peeing.  Blood in the pee.  Pee that smells bad or not like normal.  Trouble peeing.  Pee that is cloudy.  Fluid coming from the vagina, if you are female.  Pain in the belly or lower back. Other symptoms include:  Throwing up (vomiting).  No urge to eat.  Feeling mixed up (confused).  Being tired  and grouchy (irritable).  A fever.  Watery poop (diarrhea). How is this treated? This condition may be treated with:  Antibiotic medicine.  Other medicines.  Drinking enough water. Follow these instructions at home:  Medicines  Take over-the-counter and prescription medicines only as told by your doctor.  If you were prescribed an antibiotic medicine, take it as told by your doctor. Do not stop taking it even if you start to feel better. General instructions  Make sure you: ? Pee until your bladder is empty. ? Do not hold pee for a long time. ? Empty your bladder after sex. ? Wipe from front to back after pooping if you are a female. Use each tissue one time when you wipe.  Drink enough fluid to keep your pee pale yellow.  Keep all follow-up visits as told by your doctor. This is important. Contact a doctor if:  You do not get better after 1-2 days.  Your symptoms go away and then come back. Get help right away if:  You have very bad back pain.  You have very bad pain in your lower belly.  You have a fever.  You are sick to your stomach (nauseous).  You are throwing up. Summary  A urinary tract infection (UTI) is an infection of any part of the urinary tract.  This condition is caused by germs in your genital area.  There are many risk factors for a UTI. These include having a small, thin   tube to drain pee and not being able to control when you pee or poop.  Treatment includes antibiotic medicines for germs.  Drink enough fluid to keep your pee pale yellow. This information is not intended to replace advice given to you by your health care provider. Make sure you discuss any questions you have with your health care provider. Document Revised: 05/30/2018 Document Reviewed: 12/20/2017 Elsevier Patient Education  2020 Elsevier Inc.  

## 2019-08-14 ENCOUNTER — Ambulatory Visit: Payer: Medicare PPO

## 2019-08-14 DIAGNOSIS — M130 Polyarthritis, unspecified: Secondary | ICD-10-CM | POA: Insufficient documentation

## 2019-08-14 DIAGNOSIS — F0280 Dementia in other diseases classified elsewhere without behavioral disturbance: Secondary | ICD-10-CM | POA: Insufficient documentation

## 2019-08-14 DIAGNOSIS — R2681 Unsteadiness on feet: Secondary | ICD-10-CM | POA: Insufficient documentation

## 2019-08-14 DIAGNOSIS — G301 Alzheimer's disease with late onset: Secondary | ICD-10-CM | POA: Insufficient documentation

## 2019-08-18 ENCOUNTER — Ambulatory Visit: Payer: Medicare PPO | Attending: Family

## 2019-08-18 ENCOUNTER — Other Ambulatory Visit: Payer: Self-pay | Admitting: Nurse Practitioner

## 2019-08-18 DIAGNOSIS — E876 Hypokalemia: Secondary | ICD-10-CM

## 2019-08-18 DIAGNOSIS — Z23 Encounter for immunization: Secondary | ICD-10-CM

## 2019-08-18 NOTE — Progress Notes (Signed)
   Covid-19 Vaccination Clinic  Name:  Christina Delgado    MRN: [id number] DOB: [birth year]  08/18/2019  [name] was observed post Covid-19 immunization for 15 minutes without incidence. She was provided with Vaccine Information Sheet and instruction to access the V-Safe system.   [name] was instructed to call 911 with any severe reactions post vaccine: Marland Kitchen Difficulty breathing  . Swelling of your face and throat  . A fast heartbeat  . A bad rash all over your body  . Dizziness and weakness    Immunizations Administered    Name Date Dose VIS Date Route   Moderna COVID-19 Vaccine 08/18/2019 11:10 AM 0.5 mL 05/27/2019 Intramuscular   Manufacturer: Moderna   Lot: [id number]   NDC: 22482-500-37

## 2019-08-19 ENCOUNTER — Other Ambulatory Visit: Payer: Self-pay

## 2019-08-19 ENCOUNTER — Ambulatory Visit: Payer: Medicare PPO | Admitting: Family Medicine

## 2019-08-19 ENCOUNTER — Encounter: Payer: Self-pay | Admitting: Family Medicine

## 2019-08-19 VITALS — BP 132/62 | HR 63 | Temp 98.4°F | Ht 62.0 in | Wt 174.0 lb

## 2019-08-19 DIAGNOSIS — E876 Hypokalemia: Secondary | ICD-10-CM | POA: Diagnosis not present

## 2019-08-19 DIAGNOSIS — E663 Overweight: Secondary | ICD-10-CM | POA: Diagnosis not present

## 2019-08-19 DIAGNOSIS — E6609 Other obesity due to excess calories: Secondary | ICD-10-CM | POA: Diagnosis not present

## 2019-08-19 DIAGNOSIS — N390 Urinary tract infection, site not specified: Secondary | ICD-10-CM | POA: Diagnosis not present

## 2019-08-19 DIAGNOSIS — Z87440 Personal history of urinary (tract) infections: Secondary | ICD-10-CM | POA: Diagnosis not present

## 2019-08-19 DIAGNOSIS — J3089 Other allergic rhinitis: Secondary | ICD-10-CM | POA: Diagnosis not present

## 2019-08-19 DIAGNOSIS — Z6831 Body mass index (BMI) 31.0-31.9, adult: Secondary | ICD-10-CM | POA: Diagnosis not present

## 2019-08-19 DIAGNOSIS — I10 Essential (primary) hypertension: Secondary | ICD-10-CM

## 2019-08-19 DIAGNOSIS — R7309 Other abnormal glucose: Secondary | ICD-10-CM | POA: Diagnosis not present

## 2019-08-19 LAB — URINALYSIS, COMPLETE
Bilirubin, UA: NEGATIVE
Glucose, UA: NEGATIVE
Ketones, UA: NEGATIVE
Nitrite, UA: NEGATIVE
Protein,UA: NEGATIVE
Specific Gravity, UA: 1.02 (ref 1.005–1.030)
Urobilinogen, Ur: 0.2 mg/dL (ref 0.2–1.0)
pH, UA: 7 (ref 5.0–7.5)

## 2019-08-19 LAB — BAYER DCA HB A1C WAIVED: HB A1C (BAYER DCA - WAIVED): 6.3 % (ref <7.0)

## 2019-08-19 LAB — MICROSCOPIC EXAMINATION
Epithelial Cells (non renal): >10 /hpf — AB (ref 0–10)
Renal Epithel, UA: NONE SEEN /hpf

## 2019-08-19 MED ORDER — CRANBERRY 450 MG PO TABS: ORAL_TABLET | ORAL | 12 refills | Status: DC

## 2019-08-19 MED ORDER — LORATADINE 10 MG PO TABS: 10.0000 mg | ORAL_TABLET | Freq: Every day | ORAL | 11 refills | Status: DC

## 2019-08-19 MED ORDER — POTASSIUM CHLORIDE CRYS ER 20 MEQ PO TBCR: 40.0000 meq | EXTENDED_RELEASE_TABLET | Freq: Every day | ORAL | 12 refills | Status: DC

## 2019-08-19 MED ORDER — AMLODIPINE BESY-BENAZEPRIL HCL 5-10 MG PO CAPS: 1.0000 | ORAL_CAPSULE | Freq: Every day | ORAL | 3 refills | Status: DC

## 2019-08-19 NOTE — Patient Instructions (Signed)
Consider a "shewee".  A female urinal.

## 2019-08-19 NOTE — Progress Notes (Signed)
Subjective: CC: Interval follow-up PCP: Janora Norlander, DO POE:UMPN S Fecteau is a 86 y.o. female presenting to clinic today for:  1.  Recent UTI The patient is accompanied today's visit by her daughter who notes that her mentation seems to clear after antibiotics.  She did go ahead and bring in a urine sample just in case today as her urine often still smells foul.  She does depend on adult diapers because she is incontinent.  These are frequently changed during the day as she does have somebody helping her but often she does not change during the night because she is too afraid to get up to urinate.  They want to know if there is anything else she could do to be proactive about preventing urinary tract infections.  2.  Allergic rhinitis Patient would like to start an antihistamine that is nondrowsy for allergy.  She does have frequent runny nose with postnasal drip.  Does not feel that she could tolerate a nasal spray.  Not currently taking any oral antihistamines.   ROS: Per HPI  No Known Allergies Past Medical History:  Diagnosis Date  . Ankle fracture, left   . Generalized osteoarthrosis, involving multiple sites   . Glaucoma   . Hepatitis, unspecified   . Hypertension   . Hypopotassemia   . Osteoporosis     Current Outpatient Medications:  .  amLODipine-benazepril (LOTREL) 5-10 MG capsule, TAKE 1 CAPSULE DAILY, Disp: 90 capsule, Rfl: 0 .  brimonidine (ALPHAGAN) 0.15 % ophthalmic solution, Place 1 drop into both eyes 2 (two) times daily., Disp: , Rfl:  .  Calcium Carbonate-Vitamin D (CALCIUM 600+D) 600-400 MG-UNIT per tablet, Take 1 tablet by mouth daily., Disp: , Rfl:  .  donepezil (ARICEPT) 5 MG tablet, Take 1 tablet (5 mg total) by mouth at bedtime. (Patient taking differently: Take 10 mg by mouth at bedtime. ), Disp: 30 tablet, Rfl: 5 .  DORZOLAMIDE HCL-TIMOLOL MAL OP, Apply 1 drop to eye 2 (two) times daily., Disp: , Rfl:  .  latanoprost (XALATAN) 0.005 %  ophthalmic solution, Place 1 drop into the left eye at bedtime., Disp: , Rfl:  .  Misc Natural Products (OSTEO BI-FLEX TRIPLE STRENGTH) TABS, Take 4,000 each by mouth daily. Patient takes 2000 mg(2 tablets at one time daily), Disp: , Rfl:  .  Multiple Vitamins-Minerals ([employer] SPECTRAVITE PO), Take 1 capsule by mouth daily., Disp: , Rfl:  .  Omega-3 Fatty Acids (FISH OIL) 1200 MG CAPS, Take 1,200 mg by mouth 3 (three) times daily., Disp: , Rfl:  .  potassium chloride SA (KLOR-CON) 20 MEQ tablet, TAKE 2 TABLETS BY MOUTH DAILY., Disp: 60 tablet, Rfl: 0 Social History   Socioeconomic History  . Marital status: Married    Spouse name: Not on file  . Number of children: 4  . Years of education: 16  . Highest education level: Bachelor's degree (e.g., BA, AB, BS)  Occupational History  . 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 Smoking status: Never Smoker  . Smokeless tobacco: Never Used  Substance and Sexual Activity  . Alcohol use: No  . Drug use: No  . Sexual activity: Not Currently  Other Topics Concern  . Not on file  Social History Narrative  . Not on file   Social Determinants of Health   Financial Resource Strain:   . Difficulty of Paying Living Expenses: Not on file  Food Insecurity:   .  Worried About Charity fundraiser in the Last Year: Not on file  . Ran Out of Food in the Last Year: Not on file  Transportation Needs:   . Lack of Transportation (Medical): Not on file  . Lack of Transportation (Non-Medical): Not on file  Physical Activity:   . Days of Exercise per Week: Not on file  . Minutes of Exercise per Session: Not on file  Stress:   . Feeling of Stress : Not on file  Social Connections:   . Frequency of Communication with Friends and Family: Not on file  . Frequency of Social Gatherings with Friends and Family: Not on file  . Attends Religious Services: Not on file  . Active Member of Clubs or Organizations: Not on  file  . Attends Archivist Meetings: Not on file  . Marital Status: Not on file  Intimate Partner Violence:   . Fear of Current or Ex-Partner: Not on file  . Emotionally Abused: Not on file  . Physically Abused: Not on file  . Sexually Abused: Not on file   Family History  Problem Relation Age of Onset  . Stroke Father 77  . Diabetes Sister   . Stroke Mother 31  . Diabetes Mother   . Stroke Brother   . Hypertension Sister   . Hyperlipidemia Sister   . Cancer Brother        LUNG  . Diabetes Brother     Objective: Office vital signs reviewed. BP 132/62   Pulse 63   Temp 98.4 F (36.9 C) (Temporal)   Ht 5' 2" (1.575 m)   Wt 174 lb (78.9 kg)   SpO2 100%   BMI 31.83 kg/m   Physical Examination:  General: Awake, easily rouses, No acute distress Cardio: regular rate and rhythm, S1S2 heard, no murmurs appreciated Pulm: clear to auscultation bilaterally, no wheezes, rhonchi or rales; normal work of breathing on room air Extremities: warm, well perfused, No edema, cyanosis or clubbing; +2 pulses bilaterally MSK: arrives in wheelchair Neuro: interacts with provider  Assessment/ Plan: 86 y.o. female   1. Recent urinary tract infection Urinalysis with negative nitrite, many bacteria but also has many epithelial cells.  I suspect a contaminated specimen.  I will send for urine culture and if positive culture we will plan antibiotics pending sensitivities.  In the interim, continue fluid intake, may start cranberry tablets OTC.  We also discussed that sitting in a saturated diaper will often promote bacterial growth.  Recommend something like a she we/bedside commode to help with this issue - Urinalysis, Complete - Cranberry 450 MG TABS; Take as directed on package for Urinary health  Dispense: 50 tablet; Refill: 12 - Urine Culture  2. Non-seasonal allergic rhinitis, unspecified trigger Start Claritin 10 mg daily.  We discussed use of Astelin if needed and she will  contact me if Claritin is insufficiently controlling symptoms - loratadine (CLARITIN) 10 MG tablet; Take 1 tablet (10 mg total) by mouth daily.  Dispense: 30 tablet; Refill: 11  3. Essential hypertension Blood pressure controlled.  Continue current regimen - amLODipine-benazepril (LOTREL) 5-10 MG capsule; Take 1 capsule by mouth daily.  Dispense: 90 capsule; Refill: 3  4. Hypokalemia Check renal function, potassium - potassium chloride SA (KLOR-CON) 20 MEQ tablet; Take 2 tablets (40 mEq total) by mouth daily.  Dispense: 60 tablet; Refill: 12 - BMP8+EGFR  5. Class 1 obesity due to excess calories with serious comorbidity and body mass index (BMI) of 31.0 to 31.9  in adult Check A1c given history of prediabetes - Bayer DCA Hb A1c Waived   Orders Placed This Encounter  Procedures  . Urine Culture  . Urinalysis, Complete  . Bayer DCA Hb A1c Waived  . BMP8+EGFR   Meds ordered this encounter  Medications  . loratadine (CLARITIN) 10 MG tablet    Sig: Take 1 tablet (10 mg total) by mouth daily.    Dispense:  30 tablet    Refill:  11  . Cranberry 450 MG TABS    Sig: Take as directed on package for Urinary health    Dispense:  50 tablet    Refill:  12  . amLODipine-benazepril (LOTREL) 5-10 MG capsule    Sig: Take 1 capsule by mouth daily.    Dispense:  90 capsule    Refill:  3  . potassium chloride SA (KLOR-CON) 20 MEQ tablet    Sig: Take 2 tablets (40 mEq total) by mouth daily.    Dispense:  60 tablet    Refill:  [place], [place] [phone]

## 2019-08-20 ENCOUNTER — Ambulatory Visit: Payer: Medicare Other | Admitting: Family Medicine

## 2019-08-20 LAB — BMP8+EGFR
BUN/Creatinine Ratio: 13 (ref 12–28)
BUN: 10 mg/dL (ref 8–27)
CO2: 27 mmol/L (ref 20–29)
Calcium: 9.4 mg/dL (ref 8.7–10.3)
Chloride: 106 mmol/L (ref 96–106)
Creatinine, Ser: 0.78 mg/dL (ref 0.57–1.00)
GFR calc Af Amer: 80 mL/min/{1.73_m2} (ref >59)
GFR calc non Af Amer: 69 mL/min/{1.73_m2} (ref >59)
Glucose: 115 mg/dL — ABNORMAL HIGH (ref 65–99)
Potassium: 4.2 mmol/L (ref 3.5–5.2)
Sodium: 147 mmol/L — ABNORMAL HIGH (ref 134–144)

## 2019-08-22 ENCOUNTER — Other Ambulatory Visit: Payer: Self-pay | Admitting: Family Medicine

## 2019-08-22 DIAGNOSIS — N3000 Acute cystitis without hematuria: Secondary | ICD-10-CM

## 2019-08-22 LAB — URINE CULTURE

## 2019-08-22 MED ORDER — CEFDINIR 300 MG PO CAPS: 300.0000 mg | ORAL_CAPSULE | Freq: Two times a day (BID) | ORAL | 0 refills | Status: AC

## 2019-08-26 DIAGNOSIS — M130 Polyarthritis, unspecified: Secondary | ICD-10-CM | POA: Diagnosis not present

## 2019-08-26 DIAGNOSIS — Z79899 Other long term (current) drug therapy: Secondary | ICD-10-CM | POA: Diagnosis not present

## 2019-08-26 DIAGNOSIS — R269 Unspecified abnormalities of gait and mobility: Secondary | ICD-10-CM | POA: Diagnosis not present

## 2019-08-26 DIAGNOSIS — G301 Alzheimer's disease with late onset: Secondary | ICD-10-CM | POA: Diagnosis not present

## 2019-09-12 DIAGNOSIS — Z961 Presence of intraocular lens: Secondary | ICD-10-CM | POA: Diagnosis not present

## 2019-09-12 DIAGNOSIS — H401133 Primary open-angle glaucoma, bilateral, severe stage: Secondary | ICD-10-CM | POA: Diagnosis not present

## 2019-09-12 DIAGNOSIS — H2511 Age-related nuclear cataract, right eye: Secondary | ICD-10-CM | POA: Diagnosis not present

## 2019-09-12 LAB — HM DIABETES EYE EXAM

## 2019-09-16 ENCOUNTER — Ambulatory Visit: Payer: Medicare PPO | Attending: Family

## 2019-09-16 DIAGNOSIS — Z23 Encounter for immunization: Secondary | ICD-10-CM

## 2019-09-16 NOTE — Progress Notes (Signed)
   Covid-19 Vaccination Clinic  Name:  KARIMA CARRELL    MRN: [id number] DOB: [birth year]  09/16/2019  Ms. Way was observed post Covid-19 immunization for 15 minutes without incident. She was provided with Vaccine Information Sheet and instruction to access the V-Safe system.   Ms. Cudd was instructed to call 911 with any severe reactions post vaccine: Marland Kitchen Difficulty breathing  . Swelling of face and throat  . A fast heartbeat  . A bad rash all over body  . Dizziness and weakness   Immunizations Administered    Name Date Dose VIS Date Route   Moderna COVID-19 Vaccine 09/16/2019  2:36 PM 0.5 mL 05/27/2019 Intramuscular   Manufacturer: Moderna   Lot: [id number]   NDC: 06770-340-35

## 2019-10-02 DIAGNOSIS — M79676 Pain in unspecified toe(s): Secondary | ICD-10-CM | POA: Diagnosis not present

## 2019-10-02 DIAGNOSIS — B351 Tinea unguium: Secondary | ICD-10-CM | POA: Diagnosis not present

## 2019-12-03 ENCOUNTER — Other Ambulatory Visit: Payer: Self-pay | Admitting: Nurse Practitioner

## 2019-12-03 NOTE — Telephone Encounter (Signed)
This was discontinued last year for clotting risk and the fact it was not helping.

## 2019-12-04 ENCOUNTER — Encounter: Payer: Self-pay | Admitting: Family Medicine

## 2019-12-05 ENCOUNTER — Other Ambulatory Visit: Payer: Self-pay | Admitting: Family Medicine

## 2019-12-05 MED ORDER — MEGESTROL ACETATE 20 MG PO TABS: 20.0000 mg | ORAL_TABLET | Freq: Every day | ORAL | 2 refills | Status: DC

## 2019-12-09 ENCOUNTER — Telehealth: Payer: Self-pay

## 2019-12-09 MED ORDER — POTASSIUM CHLORIDE ER 10 MEQ PO TBCR: 20.0000 meq | EXTENDED_RELEASE_TABLET | Freq: Two times a day (BID) | ORAL | 2 refills | Status: DC

## 2019-12-09 NOTE — Telephone Encounter (Signed)
FYI, patient is unable to swallow the Potassium tablets as prescribed (20 meq, 2 tablets daily).   Pharmacy can change to 10 meq 2 tablets bid, which are smaller tablets.  Gave okay to do this and changed the prescription.

## 2019-12-09 NOTE — Telephone Encounter (Signed)
Yes, ok to do that and change rx.

## 2019-12-18 DIAGNOSIS — H6123 Impacted cerumen, bilateral: Secondary | ICD-10-CM | POA: Diagnosis not present

## 2019-12-18 DIAGNOSIS — H903 Sensorineural hearing loss, bilateral: Secondary | ICD-10-CM | POA: Diagnosis not present

## 2020-01-12 ENCOUNTER — Telehealth: Payer: Self-pay | Admitting: Family Medicine

## 2020-01-12 DIAGNOSIS — R413 Other amnesia: Secondary | ICD-10-CM

## 2020-01-12 MED ORDER — DONEPEZIL HCL 5 MG PO TABS: 10.0000 mg | ORAL_TABLET | Freq: Every day | ORAL | 0 refills | Status: DC

## 2020-01-12 NOTE — Addendum Note (Signed)
Addended by: Caryl Bis on: 01/12/2020 03:47 PM   Modules accepted: Orders

## 2020-01-12 NOTE — Telephone Encounter (Signed)
°  Prescription Request  01/12/2020  What is the name of the medication or equipment? donepezil (ARICEPT) 30 MG tablet    Have you contacted your pharmacy to request a refill? (if applicable) yes  Which pharmacy would you like this sent to?  [facility]   Patient notified that their request is being sent to the clinical staff for review and that they should receive a response within 2 business days.

## 2020-01-12 NOTE — Telephone Encounter (Signed)
Refill sent to pharmacy.  Needs to be seen for further refills

## 2020-01-29 DIAGNOSIS — M79676 Pain in unspecified toe(s): Secondary | ICD-10-CM | POA: Diagnosis not present

## 2020-01-29 DIAGNOSIS — B351 Tinea unguium: Secondary | ICD-10-CM | POA: Diagnosis not present

## 2020-03-10 DIAGNOSIS — H401133 Primary open-angle glaucoma, bilateral, severe stage: Secondary | ICD-10-CM | POA: Diagnosis not present

## 2020-03-10 DIAGNOSIS — H2511 Age-related nuclear cataract, right eye: Secondary | ICD-10-CM | POA: Diagnosis not present

## 2020-03-10 DIAGNOSIS — Z961 Presence of intraocular lens: Secondary | ICD-10-CM | POA: Diagnosis not present

## 2020-03-23 DIAGNOSIS — I10 Essential (primary) hypertension: Secondary | ICD-10-CM | POA: Diagnosis not present

## 2020-03-23 DIAGNOSIS — M130 Polyarthritis, unspecified: Secondary | ICD-10-CM | POA: Diagnosis not present

## 2020-03-23 DIAGNOSIS — R2681 Unsteadiness on feet: Secondary | ICD-10-CM | POA: Diagnosis not present

## 2020-03-23 DIAGNOSIS — G309 Alzheimer's disease, unspecified: Secondary | ICD-10-CM | POA: Diagnosis not present

## 2020-04-13 ENCOUNTER — Other Ambulatory Visit: Payer: Self-pay | Admitting: Family Medicine

## 2020-04-14 ENCOUNTER — Other Ambulatory Visit: Payer: Self-pay | Admitting: Family Medicine

## 2020-05-27 DIAGNOSIS — M79676 Pain in unspecified toe(s): Secondary | ICD-10-CM | POA: Diagnosis not present

## 2020-05-27 DIAGNOSIS — B351 Tinea unguium: Secondary | ICD-10-CM | POA: Diagnosis not present

## 2020-06-30 ENCOUNTER — Other Ambulatory Visit: Payer: Self-pay | Admitting: Family Medicine

## 2020-07-19 DIAGNOSIS — H401133 Primary open-angle glaucoma, bilateral, severe stage: Secondary | ICD-10-CM | POA: Diagnosis not present

## 2020-07-19 DIAGNOSIS — H2511 Age-related nuclear cataract, right eye: Secondary | ICD-10-CM | POA: Diagnosis not present

## 2020-07-19 DIAGNOSIS — Z961 Presence of intraocular lens: Secondary | ICD-10-CM | POA: Diagnosis not present

## 2020-07-29 ENCOUNTER — Other Ambulatory Visit: Payer: Self-pay | Admitting: Family Medicine

## 2020-07-29 DIAGNOSIS — J3089 Other allergic rhinitis: Secondary | ICD-10-CM

## 2020-07-30 ENCOUNTER — Other Ambulatory Visit: Payer: Medicare PPO

## 2020-07-30 ENCOUNTER — Ambulatory Visit (INDEPENDENT_AMBULATORY_CARE_PROVIDER_SITE_OTHER): Payer: Medicare PPO | Admitting: Family Medicine

## 2020-07-30 ENCOUNTER — Encounter: Payer: Self-pay | Admitting: Family Medicine

## 2020-07-30 DIAGNOSIS — Z20822 Contact with and (suspected) exposure to covid-19: Secondary | ICD-10-CM

## 2020-07-30 DIAGNOSIS — U071 COVID-19: Secondary | ICD-10-CM | POA: Diagnosis not present

## 2020-07-30 MED ORDER — BENZONATATE 100 MG PO CAPS: 100.0000 mg | ORAL_CAPSULE | Freq: Three times a day (TID) | ORAL | 1 refills | Status: DC | PRN

## 2020-07-30 MED ORDER — DEXAMETHASONE 6 MG PO TABS: 6.0000 mg | ORAL_TABLET | Freq: Every day | ORAL | 0 refills | Status: AC

## 2020-07-30 MED ORDER — AZITHROMYCIN 250 MG PO TABS: ORAL_TABLET | ORAL | 0 refills | Status: DC

## 2020-07-30 MED ORDER — ALBUTEROL SULFATE HFA 108 (90 BASE) MCG/ACT IN AERS: 2.0000 | INHALATION_SPRAY | Freq: Four times a day (QID) | RESPIRATORY_TRACT | 2 refills | Status: DC | PRN

## 2020-07-30 NOTE — Progress Notes (Signed)
Virtual Visit via Telephone Note  I connected with Kamaile Zachow Bour's daughter Elita Quick on 07/30/20 at 1:37 PM by telephone and verified that I am speaking with the correct person using two identifiers. Christina Delgado is currently located at home and her daughter is currently with her during this visit. The provider, Gwenlyn Fudge, FNP is located in their office at time of visit.  I discussed the limitations, risks, security and privacy concerns of performing an evaluation and management service by telephone and the availability of in person appointments. I also discussed with the patient that there may be a patient responsible charge related to this service. The patient expressed understanding and agreed to proceed.  Subjective: PCP: Raliegh Ip, DO  Chief Complaint  Patient presents with  . Covid Positive   Patient complains of cough, head/chest congestion, headache, runny nose, sneezing, postnasal drainage and wheezing. Onset of symptoms was 1 week ago, unchanged since that time. She is drinking plenty of fluids. Evaluation to date: at home COVID test positive. Treatment to date: Alka-Seltzer plus. She has a history of bronchitis. She does not smoke.  Patient's caregiver tested positive either Tuesday or Wednesday of this week.   ROS: Per HPI  Current Outpatient Medications:  .  amLODipine-benazepril (LOTREL) 5-10 MG capsule, Take 1 capsule by mouth daily., Disp: 90 capsule, Rfl: 3 .  brimonidine (ALPHAGAN) 0.15 % ophthalmic solution, Place 1 drop into both eyes 2 (two) times daily., Disp: , Rfl:  .  Calcium Carbonate-Vitamin D (CALCIUM 600+D) 600-400 MG-UNIT per tablet, Take 1 tablet by mouth daily., Disp: , Rfl:  .  Cranberry 450 MG TABS, Take as directed on package for Urinary health, Disp: 50 tablet, Rfl: 12 .  Cranberry-Vit C-Probiotic-Ca (GNP CRANBERRY) TABS, Take as directed on package for Urinary health, Disp: 100 tablet, Rfl: 0 .  donepezil (ARICEPT) 5 MG tablet, Take  2 tablets (10 mg total) by mouth at bedtime. NEEDS TO BE SEEN FOR FURTHER REFILLS, Disp: 60 tablet, Rfl: 0 .  DORZOLAMIDE HCL-TIMOLOL MAL OP, Apply 1 drop to eye 2 (two) times daily., Disp: , Rfl:  .  latanoprost (XALATAN) 0.005 % ophthalmic solution, Place 1 drop into the left eye at bedtime., Disp: , Rfl:  .  loratadine (CLARITIN) 10 MG tablet, TAKE 1 TABLET DAILY, Disp: 30 tablet, Rfl: 0 .  megestrol (MEGACE) 20 MG tablet, Take 1 tablet (20 mg total) by mouth daily., Disp: 30 tablet, Rfl: 2 .  Misc Natural Products (OSTEO BI-FLEX TRIPLE STRENGTH) TABS, Take 4,000 each by mouth daily. Patient takes 2000 mg(2 tablets at one time daily), Disp: , Rfl:  .  Multiple Vitamins-Minerals ([employer] SPECTRAVITE PO), Take 1 capsule by mouth daily., Disp: , Rfl:  .  Omega-3 Fatty Acids (FISH OIL) 1200 MG CAPS, Take 1,200 mg by mouth 3 (three) times daily., Disp: , Rfl:  .  potassium chloride (KLOR-CON) 10 MEQ tablet, Take 2 tablets (20 mEq total) by mouth 2 (two) times daily., Disp: 120 tablet, Rfl: 2 .  potassium chloride SA (KLOR-CON) 20 MEQ tablet, Take 2 tablets (40 mEq total) by mouth daily., Disp: 60 tablet, Rfl: 12  No Known Allergies Past Medical History:  Diagnosis Date  . Ankle fracture, left   . Generalized osteoarthrosis, involving multiple sites   . Glaucoma   . Hepatitis, unspecified   . Hypertension   . Hypopotassemia   . Osteoporosis     Observations/Objective: Unable to assess patient due to dementia.  Assessment and Plan:  1. COVID-19 - Discussed symptom management. - albuterol (VENTOLIN HFA) 108 (90 Base) MCG/ACT inhaler; Inhale 2 puffs into the lungs every 6 (six) hours as needed.  Dispense: 18 g; Refill: 2 - azithromycin (ZITHROMAX Z-PAK) 250 MG tablet; Take 2 tablets (500 mg) PO today, then 1 tablet (250 mg) PO daily x4 days.  Dispense: 6 tablet; Refill: 0 - dexamethasone (DECADRON) 6 MG tablet; Take 1 tablet (6 mg total) by mouth daily for 5 days.  Dispense: 5 tablet; Refill:  0 - benzonatate (TESSALON PERLES) 100 MG capsule; Take 1 capsule (100 mg total) by mouth 3 (three) times daily as needed for cough.  Dispense: 30 capsule; Refill: 1   Follow Up Instructions:  I discussed the assessment and treatment plan with the patient. The patient was provided an opportunity to ask questions and all were answered. The patient agreed with the plan and demonstrated an understanding of the instructions.   The patient was advised to call back or seek an in-person evaluation if the symptoms worsen or if the condition fails to improve as anticipated.  The above assessment and management plan was discussed with the patient. The patient verbalized understanding of and has agreed to the management plan. Patient is aware to call the clinic if symptoms persist or worsen. Patient is aware when to return to the clinic for a follow-up visit. Patient educated on when it is appropriate to go to the emergency department.   Time call ended: 1:47 PM  I provided 12 minutes of non-face-to-face time during this encounter.  Deliah Boston, MSN, APRN, FNP-C [facility] 07/30/20

## 2020-07-31 LAB — NOVEL CORONAVIRUS, NAA: SARS-CoV-2, NAA: DETECTED — AB

## 2020-07-31 LAB — SARS-COV-2, NAA 2 DAY TAT

## 2020-08-03 ENCOUNTER — Ambulatory Visit (INDEPENDENT_AMBULATORY_CARE_PROVIDER_SITE_OTHER): Payer: Medicare PPO | Admitting: Family Medicine

## 2020-08-03 DIAGNOSIS — I10 Essential (primary) hypertension: Secondary | ICD-10-CM | POA: Diagnosis not present

## 2020-08-03 DIAGNOSIS — Z713 Dietary counseling and surveillance: Secondary | ICD-10-CM

## 2020-08-03 DIAGNOSIS — U071 COVID-19: Secondary | ICD-10-CM | POA: Diagnosis not present

## 2020-08-03 DIAGNOSIS — G301 Alzheimer's disease with late onset: Secondary | ICD-10-CM

## 2020-08-03 DIAGNOSIS — F0280 Dementia in other diseases classified elsewhere without behavioral disturbance: Secondary | ICD-10-CM | POA: Diagnosis not present

## 2020-08-03 DIAGNOSIS — M8589 Other specified disorders of bone density and structure, multiple sites: Secondary | ICD-10-CM

## 2020-08-03 MED ORDER — AMLODIPINE BESY-BENAZEPRIL HCL 5-10 MG PO CAPS: 1.0000 | ORAL_CAPSULE | Freq: Every day | ORAL | 3 refills | Status: DC

## 2020-08-03 MED ORDER — POTASSIUM CHLORIDE ER 10 MEQ PO CPCR: 40.0000 meq | ORAL_CAPSULE | Freq: Every day | ORAL | 3 refills | Status: DC

## 2020-08-03 MED ORDER — DONEPEZIL HCL 10 MG PO TABS: 10.0000 mg | ORAL_TABLET | Freq: Every day | ORAL | 0 refills | Status: AC

## 2020-08-03 NOTE — Progress Notes (Signed)
Telephone visit  Subjective: CC: COVID infection PCP: Janora Norlander, DO DHR:CBUL S Christina Delgado is a 87 y.o. female calls for telephone consult today. Patient provides verbal consent for consult held via phone.  Due to COVID-19 pandemic this visit was conducted virtually. This visit type was conducted due to national recommendations for restrictions regarding the COVID-19 Pandemic (e.g. social distancing, sheltering in place) in an effort to limit this patient's exposure and mitigate transmission in our community. All issues noted in this document were discussed and addressed.  A physical exam was not performed with this format.   Location of patient: home Location of provider: [facility] Others present for call: daughters Olin Hauser and Katharine Look)  1. COVID 19 infection Patient tested positive for COVID-19 on 07/30/2020.  She initially started with a dry cough that was present mostly only at nighttime about the week before.  She found out that she had been exposed by one of her caregivers the first week in February and it was subsequently tested.  She unfortunately was outside of the 5-day window for monoclonal antibodies and or antivirals at the time of evaluation.  She was instead placed on azithromycin, dexamethasone and Tessalon Perles.  She is been tolerating this regimen without difficulty and her daughters do feel that symptoms are getting better.  She still has a mild cough but they deny any hemoptysis, fevers or any observed dyspnea.  Oxygen level has been in the upper 90s fairly consistently.  2.  Hypertension Compliant with Lotrel.  No reports of chest pain.  They have not checked her blood pressure today.  3.  Dementia She continues to be followed by Dr. Merlene Laughter for this and is on Aricept 10 mg daily.  He is managing those refills so they do not need any today.  Her daughter notes that she has good days and "better days".  Overall things seem to be going well from that standpoint.  She  continues to have adequate access to care at home and is well supported by her children  2.  Osteopenia Her daughter asks about vitamin D and calcium in this patient.  She admits that her mother does not eat much dairy with exception of sprinkle cheese occasionally on her meal.  At most, she gets 1 or 2 ounces.  She recognizes the importance of bone health and simply wants some guidance on amount of vitamin D and calcium her mother should be taking.   ROS: Per HPI  No Known Allergies Past Medical History:  Diagnosis Date  . Ankle fracture, left   . Generalized osteoarthrosis, involving multiple sites   . Glaucoma   . Hepatitis, unspecified   . Hypertension   . Hypopotassemia   . Osteoporosis     Current Outpatient Medications:  .  albuterol (VENTOLIN HFA) 108 (90 Base) MCG/ACT inhaler, Inhale 2 puffs into the lungs every 6 (six) hours as needed., Disp: 18 g, Rfl: 2 .  amLODipine-benazepril (LOTREL) 5-10 MG capsule, Take 1 capsule by mouth daily., Disp: 90 capsule, Rfl: 3 .  azithromycin (ZITHROMAX Z-PAK) 250 MG tablet, Take 2 tablets (500 mg) PO today, then 1 tablet (250 mg) PO daily x4 days., Disp: 6 tablet, Rfl: 0 .  benzonatate (TESSALON PERLES) 100 MG capsule, Take 1 capsule (100 mg total) by mouth 3 (three) times daily as needed for cough., Disp: 30 capsule, Rfl: 1 .  brimonidine (ALPHAGAN) 0.15 % ophthalmic solution, Place 1 drop into both eyes 2 (two) times daily., Disp: , Rfl:  .  Calcium Carbonate-Vitamin D (CALCIUM 600+D) 600-400 MG-UNIT per tablet, Take 1 tablet by mouth daily., Disp: , Rfl:  .  Cranberry 450 MG TABS, Take as directed on package for Urinary health, Disp: 50 tablet, Rfl: 12 .  Cranberry-Vit C-Probiotic-Ca (GNP CRANBERRY) TABS, Take as directed on package for Urinary health, Disp: 100 tablet, Rfl: 0 .  dexamethasone (DECADRON) 6 MG tablet, Take 1 tablet (6 mg total) by mouth daily for 5 days., Disp: 5 tablet, Rfl: 0 .  donepezil (ARICEPT) 5 MG tablet, Take 2  tablets (10 mg total) by mouth at bedtime. NEEDS TO BE SEEN FOR FURTHER REFILLS, Disp: 60 tablet, Rfl: 0 .  DORZOLAMIDE HCL-TIMOLOL MAL OP, Apply 1 drop to eye 2 (two) times daily., Disp: , Rfl:  .  latanoprost (XALATAN) 0.005 % ophthalmic solution, Place 1 drop into the left eye at bedtime., Disp: , Rfl:  .  loratadine (CLARITIN) 10 MG tablet, TAKE 1 TABLET DAILY, Disp: 30 tablet, Rfl: 0 .  megestrol (MEGACE) 20 MG tablet, Take 1 tablet (20 mg total) by mouth daily., Disp: 30 tablet, Rfl: 2 .  Misc Natural Products (OSTEO BI-FLEX TRIPLE STRENGTH) TABS, Take 4,000 each by mouth daily. Patient takes 2000 mg(2 tablets at one time daily), Disp: , Rfl:  .  Multiple Vitamins-Minerals ([employer] SPECTRAVITE PO), Take 1 capsule by mouth daily., Disp: , Rfl:  .  Omega-3 Fatty Acids (FISH OIL) 1200 MG CAPS, Take 1,200 mg by mouth 3 (three) times daily., Disp: , Rfl:  .  potassium chloride (KLOR-CON) 10 MEQ tablet, Take 2 tablets (20 mEq total) by mouth 2 (two) times daily., Disp: 120 tablet, Rfl: 2 .  potassium chloride SA (KLOR-CON) 20 MEQ tablet, Take 2 tablets (40 mEq total) by mouth daily., Disp: 60 tablet, Rfl: 12  Assessment/ Plan: 87 y.o. female   COVID-19 virus infection  Late onset Alzheimer's dementia without behavioral disturbance ([place]) - Plan: donepezil (ARICEPT) 10 MG tablet, CMP14+EGFR, TSH, CBC  Essential hypertension - Plan: amLODipine-benazepril (LOTREL) 5-10 MG capsule, CMP14+EGFR  Osteopenia of multiple sites - Plan: VITAMIN D 25 Hydroxy (Vit-D Deficiency, Fractures), CMP14+EGFR, TSH, CBC  Dietary counseling  Seems to be getting better after COVID-19 infection.  Nothing to suggest complications or secondary bacterial infection.  We discussed signs and symptoms that would be worrisome and reasons for reevaluation.  Both her daughters voiced good understanding.  Complete regimen as prescribed by nurse practitioner  Her daughters will contact me back with her blood pressure reading for  today.  Would like her to have her CMP checked given use of ACE inhibitor and potassium supplementation.  I have switched this to the potassium chloride 10 M EQ capsules for ease of administration.  They had a few questions with regards to diet, vitamin D and calcium.  Patient with known osteopenia with T score of -2.0 in September 2015.  I reviewed adequate vitamin D and calcium doses.  Supplementation will likely be important in this patient given the lack of dietary intake.  Future orders have been placed and the patient may come in at her convenience to have these done.  We will plan to see Lovena Le sometime in the spring, sooner if needed  Meds ordered this encounter  Medications  . donepezil (ARICEPT) 10 MG tablet    Sig: Take 1 tablet (10 mg total) by mouth at bedtime.    Dispense:  90 tablet    Refill:  0    Rx by neuro  . amLODipine-benazepril (LOTREL) 5-10 MG  capsule    Sig: Take 1 capsule by mouth daily.    Dispense:  90 capsule    Refill:  3  . potassium chloride (MICRO-K) 10 MEQ CR capsule    Sig: Take 4 capsules (40 mEq total) by mouth daily.    Dispense:  360 capsule    Refill:  3   Orders Placed This Encounter  Procedures  . VITAMIN D 25 Hydroxy (Vit-D Deficiency, Fractures)    Standing Status:   Future    Standing Expiration Date:   08/03/2021  . CMP14+EGFR    Standing Status:   Future    Standing Expiration Date:   08/03/2021  . TSH    Standing Status:   Future    Standing Expiration Date:   08/03/2021  . CBC    Standing Status:   Future    Standing Expiration Date:   08/03/2021     Start time: 2:30pm End time: 2:55pm  Total time spent on patient care (including telephone call/ virtual visit): 25 minutes  [place], [place] [phone]

## 2020-08-09 ENCOUNTER — Encounter: Payer: Self-pay | Admitting: Family Medicine

## 2020-08-27 ENCOUNTER — Other Ambulatory Visit: Payer: Self-pay | Admitting: Family Medicine

## 2020-08-27 DIAGNOSIS — J3089 Other allergic rhinitis: Secondary | ICD-10-CM

## 2020-09-07 DIAGNOSIS — R2681 Unsteadiness on feet: Secondary | ICD-10-CM | POA: Diagnosis not present

## 2020-09-07 DIAGNOSIS — M130 Polyarthritis, unspecified: Secondary | ICD-10-CM | POA: Diagnosis not present

## 2020-09-07 DIAGNOSIS — I10 Essential (primary) hypertension: Secondary | ICD-10-CM | POA: Diagnosis not present

## 2020-09-07 DIAGNOSIS — G309 Alzheimer's disease, unspecified: Secondary | ICD-10-CM | POA: Diagnosis not present

## 2020-09-22 ENCOUNTER — Other Ambulatory Visit: Payer: Self-pay | Admitting: Family Medicine

## 2020-09-22 ENCOUNTER — Telehealth: Payer: Self-pay

## 2020-09-22 ENCOUNTER — Ambulatory Visit: Payer: Medicare PPO | Admitting: Family Medicine

## 2020-09-22 NOTE — Telephone Encounter (Signed)
Patient scheduled for next week with Dr Nadine Counts. Daughter notified and agreed with appt.

## 2020-09-23 ENCOUNTER — Other Ambulatory Visit: Payer: Self-pay | Admitting: Family Medicine

## 2020-09-23 DIAGNOSIS — J3089 Other allergic rhinitis: Secondary | ICD-10-CM

## 2020-09-27 ENCOUNTER — Ambulatory Visit: Payer: Medicare PPO | Admitting: Family Medicine

## 2020-09-27 ENCOUNTER — Other Ambulatory Visit: Payer: Self-pay

## 2020-09-27 ENCOUNTER — Encounter: Payer: Self-pay | Admitting: Family Medicine

## 2020-09-27 VITALS — BP 126/73 | HR 80 | Temp 98.0°F | Ht 62.0 in | Wt 167.8 lb

## 2020-09-27 DIAGNOSIS — E119 Type 2 diabetes mellitus without complications: Secondary | ICD-10-CM | POA: Diagnosis not present

## 2020-09-27 DIAGNOSIS — M8589 Other specified disorders of bone density and structure, multiple sites: Secondary | ICD-10-CM

## 2020-09-27 DIAGNOSIS — I152 Hypertension secondary to endocrine disorders: Secondary | ICD-10-CM | POA: Diagnosis not present

## 2020-09-27 DIAGNOSIS — J301 Allergic rhinitis due to pollen: Secondary | ICD-10-CM

## 2020-09-27 DIAGNOSIS — Z0000 Encounter for general adult medical examination without abnormal findings: Secondary | ICD-10-CM | POA: Diagnosis not present

## 2020-09-27 DIAGNOSIS — Z7409 Other reduced mobility: Secondary | ICD-10-CM

## 2020-09-27 DIAGNOSIS — Z789 Other specified health status: Secondary | ICD-10-CM

## 2020-09-27 DIAGNOSIS — F0280 Dementia in other diseases classified elsewhere without behavioral disturbance: Secondary | ICD-10-CM

## 2020-09-27 DIAGNOSIS — N390 Urinary tract infection, site not specified: Secondary | ICD-10-CM | POA: Diagnosis not present

## 2020-09-27 DIAGNOSIS — I10 Essential (primary) hypertension: Secondary | ICD-10-CM | POA: Diagnosis not present

## 2020-09-27 DIAGNOSIS — Z23 Encounter for immunization: Secondary | ICD-10-CM

## 2020-09-27 DIAGNOSIS — E1159 Type 2 diabetes mellitus with other circulatory complications: Secondary | ICD-10-CM

## 2020-09-27 DIAGNOSIS — G301 Alzheimer's disease with late onset: Secondary | ICD-10-CM

## 2020-09-27 LAB — URINALYSIS, COMPLETE
Bilirubin, UA: NEGATIVE
Glucose, UA: NEGATIVE
Ketones, UA: NEGATIVE
Nitrite, UA: NEGATIVE
Protein,UA: NEGATIVE
RBC, UA: NEGATIVE
Specific Gravity, UA: 1.02 (ref 1.005–1.030)
Urobilinogen, Ur: 0.2 mg/dL (ref 0.2–1.0)
pH, UA: 6 (ref 5.0–7.5)

## 2020-09-27 LAB — MICROSCOPIC EXAMINATION

## 2020-09-27 LAB — BAYER DCA HB A1C WAIVED: HB A1C (BAYER DCA - WAIVED): 5.3 % (ref <7.0)

## 2020-09-27 NOTE — Progress Notes (Signed)
Subjective: CC: Checkup PCP: Janora Norlander, DO Christina Delgado is a 87 y.o. female presenting to clinic today for:  1.  Dementia Patient is accompanied today by her daughter.  She notes that the patient has been relatively stable since our last visit.  She continues to follow-up with neurology intermittently and is currently treated only with Aricept 10 mg daily.  The Namenda was felt to be redundant and not needed at this time.  She does admit that her mother has had some more difficulty with recognition, including of her children.  She often will ask who they are.  Does not report any sundowning.  Her mother is incontinent but she is cared for around-the-clock and is Dry.  There is been no evidence of pressure sores or skin breakdown.  She has not had any harm to self or others.  No mood disturbances, visual or auditory hallucinations.  No falls.  She is sleeping well.  She continues to rise diet at home with close watch by her children and her health aide.  2.  Hypertension Patient is compliant with Lotrel 5-10 mg daily.  No reports of chest pain, dizziness.  3.  Rhinorrhea Her daughter reports that she has an ongoing runny nose despite use of Claritin daily.  She is asking for alternatives that are nonsedating as patient does sleep very well.  4.  Type 2 diabetes Patient has diet-controlled diabetes.  She is on her ACE-I as above.  Again no reports of foot ulcerations or other skin breakdown.  She does eat candy quite a bit and her daughter is concerned about this potential increase in her blood sugar.  She is had her eye exam done and a copy will be requested today.  ROS: Per HPI  No Known Allergies Past Medical History:  Diagnosis Date  . Ankle fracture, left   . Generalized osteoarthrosis, involving multiple sites   . Glaucoma   . Hepatitis, unspecified   . Hypertension   . Hypopotassemia   . Osteoporosis     Current Outpatient Medications:  .  albuterol  (VENTOLIN HFA) 108 (90 Base) MCG/ACT inhaler, Inhale 2 puffs into the lungs every 6 (six) hours as needed., Disp: 18 g, Rfl: 2 .  amLODipine-benazepril (LOTREL) 5-10 MG capsule, Take 1 capsule by mouth daily., Disp: 90 capsule, Rfl: 3 .  brimonidine (ALPHAGAN) 0.15 % ophthalmic solution, Place 1 drop into both eyes 2 (two) times daily., Disp: , Rfl:  .  Calcium Carbonate-Vitamin D (CALCIUM 600+D) 600-400 MG-UNIT per tablet, Take 1 tablet by mouth daily., Disp: , Rfl:  .  Cranberry 450 MG TABS, Take as directed on package for Urinary health, Disp: 50 tablet, Rfl: 12 .  Cranberry-Vit C-Probiotic-Ca (GNP CRANBERRY) TABS, Take as directed on package for Urinary health, Disp: 200 tablet, Rfl: 0 .  donepezil (ARICEPT) 10 MG tablet, Take 1 tablet (10 mg total) by mouth at bedtime., Disp: 90 tablet, Rfl: 0 .  DORZOLAMIDE HCL-TIMOLOL MAL OP, Apply 1 drop to eye 2 (two) times daily., Disp: , Rfl:  .  latanoprost (XALATAN) 0.005 % ophthalmic solution, Place 1 drop into the left eye at bedtime., Disp: , Rfl:  .  loratadine (CLARITIN) 10 MG tablet, TAKE 1 TABLET DAILY, Disp: 30 tablet, Rfl: 5 .  Misc Natural Products (OSTEO BI-FLEX TRIPLE STRENGTH) TABS, Take 4,000 each by mouth daily. Patient takes 2000 mg(2 tablets at one time daily), Disp: , Rfl:  .  Multiple Vitamins-Minerals ([employer] SPECTRAVITE PO), Take 1  capsule by mouth daily., Disp: , Rfl:  .  Omega-3 Fatty Acids (FISH OIL) 1200 MG CAPS, Take 1,200 mg by mouth 3 (three) times daily., Disp: , Rfl:  .  potassium chloride (MICRO-K) 10 MEQ CR capsule, Take 4 capsules (40 mEq total) by mouth daily., Disp: 360 capsule, Rfl: 3 Social History   Socioeconomic History  . Marital status: Married    Spouse name: Not on file  . Number of children: 4  . Years of education: 16  . Highest education level: Bachelor's degree (e.g., BA, AB, BS)  Occupational History  . 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 Smoking  status: Never Smoker  . Smokeless tobacco: Never Used  Vaping Use  . Vaping Use: Never used  Substance and Sexual Activity  . Alcohol use: No  . Drug use: No  . Sexual activity: Not Currently  Other Topics Concern  . Not on file  Social History Narrative  . Not on file   Social Determinants of Health   Financial Resource Strain: Not on file  Food Insecurity: Not on file  Transportation Needs: Not on file  Physical Activity: Not on file  Stress: Not on file  Social Connections: Not on file  Intimate Partner Violence: Not on file   Family History  Problem Relation Age of Onset  . Stroke Father 12  . Diabetes Sister   . Stroke Mother 46  . Diabetes Mother   . Stroke Brother   . Hypertension Sister   . Hyperlipidemia Sister   . Cancer Brother        LUNG  . Diabetes Brother     Objective: Office vital signs reviewed. BP 126/73   Pulse 80   Temp 98 F (36.7 C) (Temporal)   Ht '5\' 2"'  (1.575 m)   Wt 167 lb 12.8 oz (76.1 kg)   SpO2 98%   BMI 30.69 kg/m   Physical Examination:  General: intermittently dosing, well nourished, No acute distress HEENT: Normal; sclera white Cardio: regular rate and rhythm, S1S2 heard, no murmurs appreciated Pulm: clear to auscultation bilaterally, no wheezes, rhonchi or rales; normal work of breathing on room air Extremities: warm, well perfused, No edema, cyanosis or clubbing; +2 pulses bilaterally MSK: Slow, shuffled gait with use of walker Neuro: Intermittently sleeping during exam.  Patient otherwise is engaging  Assessment/ Plan: 87 y.o. female   Late onset Alzheimer's dementia without behavioral disturbance ([place]) - Plan: CMP14+EGFR, Thyroid Panel With TSH  Hypertension associated with diabetes ([place]) - Plan: CMP14+EGFR  Diet-controlled diabetes mellitus ([place]) - Plan: CMP14+EGFR, Lipid panel, Thyroid Panel With TSH, Bayer DCA Hb A1c Waived  Impaired mobility and ADLs - Plan: Thyroid Panel With TSH  Osteopenia of multiple  sites - Plan: Thyroid Panel With TSH  Frequent urinary tract infections - Plan: CBC with Differential/Platelet, Urinalysis, Complete, Urine Culture, Microscopic Examination  Seasonal allergic rhinitis due to pollen  Dementia with slight progression but overall stable with Aricept alone.  She not having any mood disturbances or concerns for self-harm or harm to others.  Certainly no visual or auditory hallucinations.  Blood pressure is well controlled.  No changes to current regimen  Sugar is diet controlled.  Continue to use walker.  No falls.  Continue with strengthening exercises  Apparently lipid panel, CMP and CBC requested.  These were obtained but these were nonfasting labs.  Urinalysis did show bacteria with leukocytes.  Uncertain if these were  related to the sample having been left out too long and therefore we will add a urine culture.  She is not currently symptomatic.  Okay to switch from Claritin to [place] as this is a nondrowsy alternative.  Would consider reduced dose given age.  First Shingrix vaccine was administered today.  Plan for repeat at her next 3 to 30-monthvisit  No orders of the defined types were placed in this encounter.  No orders of the defined types were placed in this encounter.    AJanora Norlander DO [place]([phone]

## 2020-09-28 LAB — LIPID PANEL
Chol/HDL Ratio: 3.5 ratio (ref 0.0–4.4)
Cholesterol, Total: 194 mg/dL (ref 100–199)
HDL: 55 mg/dL (ref >39)
LDL Chol Calc (NIH): 118 mg/dL — ABNORMAL HIGH (ref 0–99)
Triglycerides: 117 mg/dL (ref 0–149)
VLDL Cholesterol Cal: 21 mg/dL (ref 5–40)

## 2020-09-28 LAB — CMP14+EGFR
ALT: 17 IU/L (ref 0–32)
AST: 24 IU/L (ref 0–40)
Albumin/Globulin Ratio: 1.4 (ref 1.2–2.2)
Albumin: 4.1 g/dL (ref 3.6–4.6)
Alkaline Phosphatase: 49 IU/L (ref 44–121)
BUN/Creatinine Ratio: 11 — ABNORMAL LOW (ref 12–28)
BUN: 6 mg/dL — ABNORMAL LOW (ref 8–27)
Bilirubin Total: <0.2 mg/dL (ref 0.0–1.2)
CO2: 22 mmol/L (ref 20–29)
Calcium: 9.2 mg/dL (ref 8.7–10.3)
Chloride: 105 mmol/L (ref 96–106)
Creatinine, Ser: 0.56 mg/dL — ABNORMAL LOW (ref 0.57–1.00)
Globulin, Total: 3 g/dL (ref 1.5–4.5)
Glucose: 101 mg/dL — ABNORMAL HIGH (ref 65–99)
Potassium: 4.1 mmol/L (ref 3.5–5.2)
Sodium: 144 mmol/L (ref 134–144)
Total Protein: 7.1 g/dL (ref 6.0–8.5)
eGFR: 88 mL/min/{1.73_m2} (ref >59)

## 2020-09-28 LAB — CBC WITH DIFFERENTIAL/PLATELET
Basophils Absolute: 0 10*3/uL (ref 0.0–0.2)
Basos: 0 %
EOS (ABSOLUTE): 0.2 10*3/uL (ref 0.0–0.4)
Eos: 3 %
Hematocrit: 33.5 % — ABNORMAL LOW (ref 34.0–46.6)
Hemoglobin: 11.3 g/dL (ref 11.1–15.9)
Immature Grans (Abs): 0 10*3/uL (ref 0.0–0.1)
Immature Granulocytes: 0 %
Lymphocytes Absolute: 2.2 10*3/uL (ref 0.7–3.1)
Lymphs: 41 %
MCH: 29.4 pg (ref 26.6–33.0)
MCHC: 33.7 g/dL (ref 31.5–35.7)
MCV: 87 fL (ref 79–97)
Monocytes Absolute: 0.4 10*3/uL (ref 0.1–0.9)
Monocytes: 7 %
Neutrophils Absolute: 2.7 10*3/uL (ref 1.4–7.0)
Neutrophils: 49 %
Platelets: 172 10*3/uL (ref 150–450)
RBC: 3.85 x10E6/uL (ref 3.77–5.28)
RDW: 13 % (ref 11.7–15.4)
WBC: 5.4 10*3/uL (ref 3.4–10.8)

## 2020-09-28 LAB — THYROID PANEL WITH TSH
Free Thyroxine Index: 1.4 (ref 1.2–4.9)
T3 Uptake Ratio: 22 % — ABNORMAL LOW (ref 24–39)
T4, Total: 6.3 ug/dL (ref 4.5–12.0)
TSH: 4.27 u[IU]/mL (ref 0.450–4.500)

## 2020-09-29 LAB — URINE CULTURE

## 2020-10-01 ENCOUNTER — Other Ambulatory Visit: Payer: Self-pay | Admitting: *Deleted

## 2020-10-01 DIAGNOSIS — R410 Disorientation, unspecified: Secondary | ICD-10-CM

## 2020-10-21 ENCOUNTER — Other Ambulatory Visit: Payer: Self-pay | Admitting: Family Medicine

## 2020-10-21 ENCOUNTER — Emergency Department (HOSPITAL_BASED_OUTPATIENT_CLINIC_OR_DEPARTMENT_OTHER)
Admission: EM | Admit: 2020-10-21 | Discharge: 2020-10-21 | Disposition: A | Discharge status: Home / Self Care | Payer: Medicare PPO | Attending: Emergency Medicine | Admitting: Emergency Medicine

## 2020-10-21 ENCOUNTER — Other Ambulatory Visit: Payer: Self-pay

## 2020-10-21 ENCOUNTER — Emergency Department (HOSPITAL_BASED_OUTPATIENT_CLINIC_OR_DEPARTMENT_OTHER): Payer: Medicare PPO

## 2020-10-21 ENCOUNTER — Encounter (HOSPITAL_BASED_OUTPATIENT_CLINIC_OR_DEPARTMENT_OTHER): Payer: Self-pay | Admitting: *Deleted

## 2020-10-21 DIAGNOSIS — I10 Essential (primary) hypertension: Secondary | ICD-10-CM | POA: Diagnosis not present

## 2020-10-21 DIAGNOSIS — G253 Myoclonus: Secondary | ICD-10-CM | POA: Insufficient documentation

## 2020-10-21 DIAGNOSIS — E119 Type 2 diabetes mellitus without complications: Secondary | ICD-10-CM | POA: Diagnosis not present

## 2020-10-21 DIAGNOSIS — F0390 Unspecified dementia without behavioral disturbance: Secondary | ICD-10-CM | POA: Diagnosis not present

## 2020-10-21 DIAGNOSIS — R350 Frequency of micturition: Secondary | ICD-10-CM | POA: Insufficient documentation

## 2020-10-21 DIAGNOSIS — Z8616 Personal history of COVID-19: Secondary | ICD-10-CM | POA: Insufficient documentation

## 2020-10-21 DIAGNOSIS — R569 Unspecified convulsions: Secondary | ICD-10-CM | POA: Diagnosis not present

## 2020-10-21 DIAGNOSIS — Z79899 Other long term (current) drug therapy: Secondary | ICD-10-CM | POA: Diagnosis not present

## 2020-10-21 LAB — BASIC METABOLIC PANEL
Anion gap: 13 (ref 5–15)
BUN: 10 mg/dL (ref 8–23)
CO2: 24 mmol/L (ref 22–32)
Calcium: 9.3 mg/dL (ref 8.9–10.3)
Chloride: 101 mmol/L (ref 98–111)
Creatinine, Ser: 0.69 mg/dL (ref 0.44–1.00)
GFR, Estimated: >60 mL/min (ref >60)
Glucose, Bld: 139 mg/dL — ABNORMAL HIGH (ref 70–99)
Potassium: 3.4 mmol/L — ABNORMAL LOW (ref 3.5–5.1)
Sodium: 138 mmol/L (ref 135–145)

## 2020-10-21 LAB — URINALYSIS, ROUTINE W REFLEX MICROSCOPIC
Bilirubin Urine: NEGATIVE
Glucose, UA: NEGATIVE mg/dL
Hgb urine dipstick: NEGATIVE
Ketones, ur: NEGATIVE mg/dL
Leukocytes,Ua: NEGATIVE
Nitrite: NEGATIVE
Protein, ur: NEGATIVE mg/dL
Specific Gravity, Urine: 1.015 (ref 1.005–1.030)
pH: 8 (ref 5.0–8.0)

## 2020-10-21 LAB — CBC WITH DIFFERENTIAL/PLATELET
Abs Immature Granulocytes: 0.01 10*3/uL (ref 0.00–0.07)
Basophils Absolute: 0 10*3/uL (ref 0.0–0.1)
Basophils Relative: 0 %
Eosinophils Absolute: 0.1 10*3/uL (ref 0.0–0.5)
Eosinophils Relative: 2 %
HCT: 38 % (ref 36.0–46.0)
Hemoglobin: 12.4 g/dL (ref 12.0–15.0)
Immature Granulocytes: 0 %
Lymphocytes Relative: 34 %
Lymphs Abs: 2.4 10*3/uL (ref 0.7–4.0)
MCH: 29.8 pg (ref 26.0–34.0)
MCHC: 32.6 g/dL (ref 30.0–36.0)
MCV: 91.3 fL (ref 80.0–100.0)
Monocytes Absolute: 0.3 10*3/uL (ref 0.1–1.0)
Monocytes Relative: 4 %
Neutro Abs: 4.2 10*3/uL (ref 1.7–7.7)
Neutrophils Relative %: 60 %
Platelets: 172 10*3/uL (ref 150–400)
RBC: 4.16 MIL/uL (ref 3.87–5.11)
RDW: 13.2 % (ref 11.5–15.5)
WBC: 7.1 10*3/uL (ref 4.0–10.5)
nRBC: 0 % (ref 0.0–0.2)

## 2020-10-21 LAB — CBG MONITORING, ED: Glucose-Capillary: 144 mg/dL — ABNORMAL HIGH (ref 70–99)

## 2020-10-21 MED ORDER — TRAZODONE HCL 50 MG PO TABS: 50.0000 mg | ORAL_TABLET | Freq: Every evening | ORAL | 0 refills | Status: DC | PRN

## 2020-10-21 NOTE — ED Triage Notes (Signed)
Emergency Medicine Provider Triage Evaluation Note  Christina Delgado , a 87 y.o. female  was evaluated in triage.  Pt complains of urinary frequency.  Review of Systems  Positive: Seizure like activity, polyuria, fatigue Negative: Fever, cough, pain  Physical Exam  BP 128/67 (BP Location: Left Arm)   Pulse 96   Temp 99.8 F (37.7 C) (Oral)   Resp 20   Ht 5\' 2"  (1.575 m)   Wt 76.1 kg   SpO2 98%   BMI 30.69 kg/m  Gen:   Awake, no distress   HEENT:  Atraumatic  Resp:  Normal effort  Cardiac:  Normal rate  Abd:   Nondistended, nontender  MSK:   Moves extremities without difficulty  Neuro:  baseline  Medical Decision Making  Medically screening exam initiated at 2:47 PM.  Appropriate orders placed.  Christina Delgado was informed that the remainder of the evaluation will be completed by another provider, this initial triage assessment does not replace that evaluation, and the importance of remaining in the ED until their evaluation is complete.  Clinical Impression  Seizure like activity last night, several days of urinary frequency   Reginia Naas, PA-C 10/21/20 1448

## 2020-10-21 NOTE — ED Notes (Signed)
Patient verbalizes understanding of discharge instructions. Opportunity for questioning and answers were provided. Armband removed by staff, pt discharged from ED.  

## 2020-10-21 NOTE — ED Provider Notes (Signed)
[facility] EMERGENCY DEPARTMENT Provider Note   CSN: [id number] Arrival date & time: 10/21/20  1409     History Chief Complaint  Patient presents with  . Urinary Frequency    Christina Delgado is a 87 y.o. female.  The history is provided by a relative and medical records. No language interpreter was used.  Urinary Frequency     88 year old female significant history of late onset dementia, diabetes, hepatitis, hypertension brought in by daughter for evaluation of urinary frequency and seizure-like activity.  Per daughter for the past 2 to 3 days patient has been urinating more than usual.  Last night she was shaking and exhibits some odd behaviors which daughter thought could be a seizure.  States that she has had similar symptoms like this in the past but no known diagnosis of seizure.  Patient lives at home with her caregiver.  No report of any fever, cold symptoms, or burning urination.  Unable to obtain additional history due to patient's underlying dementia.  No report of any recent medication changes.  Level 5 caveat is due to dementia.  Past Medical History:  Diagnosis Date  . Ankle fracture, left   . COVID winter 2021-2022  . Generalized osteoarthrosis, involving multiple sites   . Glaucoma   . Hepatitis, unspecified   . Hypertension   . Hypopotassemia   . Osteoporosis     Patient Active Problem List   Diagnosis Date Noted  . Osteoporosis with fracture 03/11/2014  . Hyperlipidemia with target LDL less than 100 12/17/2013  . Hypokalemia 12/17/2013  . BMI 24.0-24.9, adult 12/17/2013  . Hypertension 10/12/2010  . DJD (degenerative joint disease), lumbar 10/12/2010  . Glaucoma 10/12/2010  . Diabetes mellitus ([name]) 10/12/2010  . Hepatitis 10/12/2010    Past Surgical History:  Procedure Laterality Date  . FRACTURE SURGERY Left       . REFRACTIVE SURGERY       OB History   No obstetric history on file.     Family History  Problem Relation Age  of Onset  . Stroke Father 63  . Diabetes Sister   . Stroke Mother 58  . Diabetes Mother   . Stroke Brother   . Hypertension Sister   . Hyperlipidemia Sister   . Cancer Brother        LUNG  . Diabetes Brother     Social History   Tobacco Use  . Smoking status: Never Smoker  . Smokeless tobacco: Never Used  Vaping Use  . Vaping Use: Never used  Substance Use Topics  . Alcohol use: No  . Drug use: No    Home Medications Prior to Admission medications   Medication Sig Start Date End Date Taking? Authorizing Provider  amLODipine-benazepril (LOTREL) 5-10 MG capsule Take 1 capsule by mouth daily. 08/03/20   Raliegh Ip, DO  brimonidine (ALPHAGAN) 0.15 % ophthalmic solution Place 1 drop into both eyes 2 (two) times daily.    [provider]  Calcium Carbonate-Vitamin D 600-400 MG-UNIT tablet Take 1 tablet by mouth daily.    [provider]  Cranberry 450 MG TABS Take as directed on package for Urinary health 08/19/19   Raliegh Ip, DO  donepezil (ARICEPT) 10 MG tablet Take 1 tablet (10 mg total) by mouth at bedtime. 08/03/20   Delynn Flavin M, DO  DORZOLAMIDE HCL-TIMOLOL MAL OP Apply 1 drop to eye 2 (two) times daily.    [provider]  latanoprost (XALATAN) 0.005 % ophthalmic solution  Place 1 drop into the left eye at bedtime.    [provider]  loratadine (CLARITIN) 10 MG tablet TAKE 1 TABLET DAILY 09/23/20   Delynn Flavin M, DO  Misc Natural Products (OSTEO BI-FLEX TRIPLE STRENGTH) TABS Take 4,000 each by mouth daily. Patient takes 2000 mg(2 tablets at one time daily)    [provider]  Multiple Vitamins-Minerals ([employer] SPECTRAVITE PO) Take 1 capsule by mouth daily.    [provider]  Omega-3 Fatty Acids (FISH OIL) 1200 MG CAPS Take 1,200 mg by mouth 3 (three) times daily.    [provider]  potassium chloride (MICRO-K) 10 MEQ CR capsule Take 4 capsules (40 mEq total) by mouth daily. 08/03/20 11/01/20   Raliegh Ip, DO    Allergies    Patient has no known allergies.  Review of Systems   Review of Systems  Unable to perform ROS: Dementia  Genitourinary: Positive for frequency.    Physical Exam Updated Vital Signs BP 128/67 (BP Location: Left Arm)   Pulse 96   Temp 99.8 F (37.7 C) (Oral)   Resp 20   Ht 5\' 2"  (1.575 m)   Wt 76.1 kg   SpO2 98%   BMI 30.69 kg/m   Physical Exam Vitals and nursing note reviewed.  Constitutional:      General: She is not in acute distress.    Appearance: She is well-developed.     Comments: Elderly female laying in bed appears to be in no acute discomfort  HENT:     Head: Normocephalic and atraumatic.     Mouth/Throat:     Mouth: Mucous membranes are moist.  Eyes:     Extraocular Movements: Extraocular movements intact.     Conjunctiva/sclera: Conjunctivae normal.     Pupils: Pupils are equal, round, and reactive to light.  Cardiovascular:     Rate and Rhythm: Normal rate and regular rhythm.     Pulses: Normal pulses.     Heart sounds: Normal heart sounds.  Pulmonary:     Breath sounds: Normal breath sounds.  Abdominal:     Palpations: Abdomen is soft.     Tenderness: There is no abdominal tenderness.  Musculoskeletal:        General: Normal range of motion.     Cervical back: Normal range of motion and neck supple.     Comments: Able to move all 4 extremities  Skin:    General: Skin is warm.     Findings: No rash.  Neurological:     Mental Status: She is alert.     GCS: GCS eye subscore is 4. GCS verbal subscore is 4. GCS motor subscore is 6.     Motor: No tremor.     ED Results / Procedures / Treatments   Labs (all labs ordered are listed, but only abnormal results are displayed) Labs Reviewed  BASIC METABOLIC PANEL - Abnormal; Notable for the following components:      Result Value   Potassium 3.4 (*)    Glucose, Bld 139 (*)    All other components within normal limits  CBG MONITORING, ED - Abnormal; Notable  for the following components:   Glucose-Capillary 144 (*)    All other components within normal limits  URINALYSIS, ROUTINE W REFLEX MICROSCOPIC  CBC WITH DIFFERENTIAL/PLATELET    EKG None  Radiology CT Head Wo Contrast  Result Date: 10/21/2020 CLINICAL DATA:  Delirium with seizure-like activity. EXAM: CT HEAD WITHOUT CONTRAST TECHNIQUE: Contiguous axial images were  obtained from the base of the skull through the vertex without intravenous contrast. COMPARISON:  January 11, 2019 FINDINGS: Brain: No evidence of acute large vascular territory infarction, hemorrhage, hydrocephalus, extra-axial collection or mass lesion/mass effect. Similar moderate burden of chronic ischemic white matter disease. Bilateral basal ganglia mineralization. Dense dural calcifications. Vascular: No hyperdense vessel. Atherosclerotic calcifications of the internal carotid arteries at the skull base. Skull: Hyperostosis interna.  Negative for fracture or focal lesion. Sinuses/Orbits: The paranasal sinuses and mastoid air cells are predominantly clear. Orbits are grossly unremarkable. Other: None IMPRESSION: 1. No acute intracranial findings. 2. Similar moderate burden of chronic ischemic white matter disease. Electronically Signed   By: Maudry Mayhew MD   On: 10/21/2020 16:47    Procedures Procedures   Medications Ordered in ED Medications - No data to display  ED Course  I have reviewed the triage vital signs and the nursing notes.  Pertinent labs & imaging results that were available during my care of the patient were reviewed by me and considered in my medical decision making (see chart for details).    MDM Rules/Calculators/A&P                          BP 133/60 (BP Location: Left Arm)   Pulse 64   Temp 99.8 F (37.7 C) (Oral)   Resp 18   Ht 5\' 2"  (1.575 m)   Wt 76.1 kg   SpO2 97%   BMI 30.69 kg/m   Final Clinical Impression(s) / ED Diagnoses Final diagnoses:  Myoclonic jerking    Rx / DC  Orders ED Discharge Orders         Ordered    traZODone (DESYREL) 50 MG tablet  At bedtime PRN        10/21/20 1736         3:07 PM Patient with late onset dementia brought in by daughter due to increased urinary frequency for the past few days as well as some seizure-like activities yesterday.  No known history of seizure.  Was seen a few years ago for similar presentation, diagnosed with myoclonic jerking from sleep deprivation.    5:38 PM Today work-up is essentially normal.  UA without signs of urinary tract infection, electrolyte panels are reassuring, no anemia, normal white count, CT scan of the head without acute finding.  I discussed care with Dr. 10/23/20.  We felt patient presentation may be secondary to sleep deprivation as it was manifest previously.  Will prescribe short course of sleeping aid however family member should be closely present for close monitoring.  Outpatient follow-up with neurology recommended.  Return precaution given.   Deretha Emory, PA-C 10/21/20 1739    [place], 10/23/20, MD 10/22/20 0800

## 2020-10-21 NOTE — Telephone Encounter (Signed)
Rx sent 07/2020.  Not sure why this is coming in?

## 2020-10-21 NOTE — ED Notes (Signed)
Patient transported to CT 

## 2020-10-21 NOTE — ED Notes (Signed)
ED Provider at bedside. 

## 2020-10-21 NOTE — ED Triage Notes (Signed)
Jerking last night. Urinary frequency. Her daughter feels she has a UTI. She does not rest then she has fatigue per family. Hx of alzheimer's.

## 2020-10-21 NOTE — Discharge Instructions (Signed)
Your symptoms may be due to lack of sleeping.  You may take sleeping medication as needed but please have family member close by to monitor her closely.  Follow-up closely with your neurologist for further care.  Fortunately your labs, urine, and your head CT scan today did not show any concerning finding.

## 2020-10-22 ENCOUNTER — Other Ambulatory Visit: Payer: Self-pay | Admitting: Family Medicine

## 2020-10-29 ENCOUNTER — Emergency Department (HOSPITAL_COMMUNITY): Payer: Medicare PPO

## 2020-10-29 ENCOUNTER — Other Ambulatory Visit: Payer: Self-pay

## 2020-10-29 ENCOUNTER — Emergency Department (HOSPITAL_COMMUNITY)
Admission: EM | Admit: 2020-10-29 | Discharge: 2020-10-29 | Disposition: A | Discharge status: Home / Self Care | Payer: Medicare PPO | Attending: Emergency Medicine | Admitting: Emergency Medicine

## 2020-10-29 ENCOUNTER — Encounter (HOSPITAL_COMMUNITY): Payer: Self-pay

## 2020-10-29 DIAGNOSIS — R109 Unspecified abdominal pain: Secondary | ICD-10-CM | POA: Insufficient documentation

## 2020-10-29 DIAGNOSIS — R609 Edema, unspecified: Secondary | ICD-10-CM | POA: Diagnosis not present

## 2020-10-29 DIAGNOSIS — N390 Urinary tract infection, site not specified: Secondary | ICD-10-CM

## 2020-10-29 DIAGNOSIS — F0390 Unspecified dementia without behavioral disturbance: Secondary | ICD-10-CM | POA: Insufficient documentation

## 2020-10-29 DIAGNOSIS — I10 Essential (primary) hypertension: Secondary | ICD-10-CM | POA: Insufficient documentation

## 2020-10-29 DIAGNOSIS — R404 Transient alteration of awareness: Secondary | ICD-10-CM | POA: Diagnosis not present

## 2020-10-29 DIAGNOSIS — K8020 Calculus of gallbladder without cholecystitis without obstruction: Secondary | ICD-10-CM | POA: Diagnosis not present

## 2020-10-29 DIAGNOSIS — K8010 Calculus of gallbladder with chronic cholecystitis without obstruction: Secondary | ICD-10-CM

## 2020-10-29 DIAGNOSIS — A419 Sepsis, unspecified organism: Secondary | ICD-10-CM | POA: Diagnosis not present

## 2020-10-29 DIAGNOSIS — R531 Weakness: Secondary | ICD-10-CM | POA: Diagnosis not present

## 2020-10-29 DIAGNOSIS — R7989 Other specified abnormal findings of blood chemistry: Secondary | ICD-10-CM | POA: Diagnosis not present

## 2020-10-29 DIAGNOSIS — K828 Other specified diseases of gallbladder: Secondary | ICD-10-CM | POA: Diagnosis not present

## 2020-10-29 DIAGNOSIS — R112 Nausea with vomiting, unspecified: Secondary | ICD-10-CM | POA: Diagnosis not present

## 2020-10-29 DIAGNOSIS — R197 Diarrhea, unspecified: Secondary | ICD-10-CM | POA: Insufficient documentation

## 2020-10-29 DIAGNOSIS — I959 Hypotension, unspecified: Secondary | ICD-10-CM | POA: Diagnosis not present

## 2020-10-29 DIAGNOSIS — Z8616 Personal history of COVID-19: Secondary | ICD-10-CM | POA: Insufficient documentation

## 2020-10-29 DIAGNOSIS — E119 Type 2 diabetes mellitus without complications: Secondary | ICD-10-CM | POA: Diagnosis not present

## 2020-10-29 DIAGNOSIS — R0689 Other abnormalities of breathing: Secondary | ICD-10-CM | POA: Diagnosis not present

## 2020-10-29 DIAGNOSIS — E876 Hypokalemia: Secondary | ICD-10-CM | POA: Diagnosis not present

## 2020-10-29 DIAGNOSIS — R110 Nausea: Secondary | ICD-10-CM | POA: Diagnosis not present

## 2020-10-29 DIAGNOSIS — Z79899 Other long term (current) drug therapy: Secondary | ICD-10-CM | POA: Insufficient documentation

## 2020-10-29 DIAGNOSIS — R0902 Hypoxemia: Secondary | ICD-10-CM | POA: Diagnosis not present

## 2020-10-29 DIAGNOSIS — R1110 Vomiting, unspecified: Secondary | ICD-10-CM | POA: Diagnosis not present

## 2020-10-29 LAB — CBC WITH DIFFERENTIAL/PLATELET
Abs Immature Granulocytes: 0.08 10*3/uL — ABNORMAL HIGH (ref 0.00–0.07)
Basophils Absolute: 0 10*3/uL (ref 0.0–0.1)
Basophils Relative: 0 %
Eosinophils Absolute: 0.1 10*3/uL (ref 0.0–0.5)
Eosinophils Relative: 0 %
HCT: 42.9 % (ref 36.0–46.0)
Hemoglobin: 12.9 g/dL (ref 12.0–15.0)
Immature Granulocytes: 1 %
Lymphocytes Relative: 11 %
Lymphs Abs: 1.5 10*3/uL (ref 0.7–4.0)
MCH: 29.9 pg (ref 26.0–34.0)
MCHC: 30.1 g/dL (ref 30.0–36.0)
MCV: 99.5 fL (ref 80.0–100.0)
Monocytes Absolute: 1 10*3/uL (ref 0.1–1.0)
Monocytes Relative: 8 %
Neutro Abs: 10.8 10*3/uL — ABNORMAL HIGH (ref 1.7–7.7)
Neutrophils Relative %: 80 %
Platelets: 129 10*3/uL — ABNORMAL LOW (ref 150–400)
RBC: 4.31 MIL/uL (ref 3.87–5.11)
RDW: 13.2 % (ref 11.5–15.5)
WBC: 13.5 10*3/uL — ABNORMAL HIGH (ref 4.0–10.5)
nRBC: 0 % (ref 0.0–0.2)

## 2020-10-29 LAB — COMPREHENSIVE METABOLIC PANEL
ALT: 70 U/L — ABNORMAL HIGH (ref 0–44)
AST: 126 U/L — ABNORMAL HIGH (ref 15–41)
Albumin: 3.7 g/dL (ref 3.5–5.0)
Alkaline Phosphatase: 54 U/L (ref 38–126)
Anion gap: 12 (ref 5–15)
BUN: 12 mg/dL (ref 8–23)
CO2: 20 mmol/L — ABNORMAL LOW (ref 22–32)
Calcium: 9.2 mg/dL (ref 8.9–10.3)
Chloride: 111 mmol/L (ref 98–111)
Creatinine, Ser: 0.95 mg/dL (ref 0.44–1.00)
GFR, Estimated: 58 mL/min — ABNORMAL LOW (ref >60)
Glucose, Bld: 149 mg/dL — ABNORMAL HIGH (ref 70–99)
Potassium: 4.2 mmol/L (ref 3.5–5.1)
Sodium: 143 mmol/L (ref 135–145)
Total Bilirubin: 1.3 mg/dL — ABNORMAL HIGH (ref 0.3–1.2)
Total Protein: 7.2 g/dL (ref 6.5–8.1)

## 2020-10-29 LAB — URINALYSIS, ROUTINE W REFLEX MICROSCOPIC
Bacteria, UA: NONE SEEN
Bilirubin Urine: NEGATIVE
Glucose, UA: NEGATIVE mg/dL
Ketones, ur: NEGATIVE mg/dL
Nitrite: NEGATIVE
Protein, ur: 30 mg/dL — AB
RBC / HPF: >50 RBC/hpf — ABNORMAL HIGH (ref 0–5)
Specific Gravity, Urine: 1.034 — ABNORMAL HIGH (ref 1.005–1.030)
WBC, UA: >50 WBC/hpf — ABNORMAL HIGH (ref 0–5)
pH: 5 (ref 5.0–8.0)

## 2020-10-29 LAB — PROTIME-INR
INR: 1.1 (ref 0.8–1.2)
Prothrombin Time: 14.5 seconds (ref 11.4–15.2)

## 2020-10-29 LAB — LACTIC ACID, PLASMA
Lactic Acid, Venous: 3.9 mmol/L (ref 0.5–1.9)
Lactic Acid, Venous: 1.3 mmol/L (ref 0.5–1.9)

## 2020-10-29 LAB — TROPONIN I (HIGH SENSITIVITY)
Troponin I (High Sensitivity): 4 ng/L (ref <18)
Troponin I (High Sensitivity): 7 ng/L (ref <18)

## 2020-10-29 MED ORDER — SODIUM CHLORIDE 0.9 % IV BOLUS
1000.0000 mL | Freq: Once | INTRAVENOUS | Status: AC
  Administered 2020-10-29: 1000 mL via INTRAVENOUS

## 2020-10-29 MED ORDER — SODIUM CHLORIDE 0.9 % IV SOLN
1.0000 g | Freq: Once | INTRAVENOUS | Status: AC
  Administered 2020-10-29: 1 g via INTRAVENOUS
  Filled 2020-10-29: qty 10

## 2020-10-29 MED ORDER — SODIUM CHLORIDE 0.9 % IV BOLUS
500.0000 mL | Freq: Once | INTRAVENOUS | Status: AC
  Administered 2020-10-29: 500 mL via INTRAVENOUS

## 2020-10-29 MED ORDER — AMOXICILLIN-POT CLAVULANATE 875-125 MG PO TABS: 1.0000 | ORAL_TABLET | Freq: Two times a day (BID) | ORAL | 0 refills | Status: DC

## 2020-10-29 MED ORDER — IOHEXOL 300 MG/ML  SOLN
100.0000 mL | Freq: Once | INTRAMUSCULAR | Status: AC | PRN
  Administered 2020-10-29: 100 mL via INTRAVENOUS

## 2020-10-29 MED ORDER — TECHNETIUM TC 99M MEBROFENIN IV KIT
5.0000 | PACK | Freq: Once | INTRAVENOUS | Status: AC | PRN
  Administered 2020-10-29: 5.14 via INTRAVENOUS

## 2020-10-29 NOTE — ED Provider Notes (Signed)
7:40 AM-checkout from Dr. Manus Gunning to evaluate patient following return of urinalysis, and completion of treatment.  8:50 AM-urinalysis returned consistent with UTI.  She has been treated with Rocephin for presumptive UTI.  She has received IV fluids.  Repeat vital signs are normal.  At this time the patient is alert and cooperative and states she feels better.  Patient's daughter is in the room, and I talked to her as well as the patient's son Chrissie Noa who is a Careers information officer, by telephone.  Patient's daughter states that she came to the patient's home, and found that she had had some diarrhea and vomiting.,  Which is what prompted the visit to the ED.  At this point we will proceed with evaluation capability for discharge, check orthostatics, ambulate hall, ensure that she can tolerate oral nutrition.  Urine culture ordered.  Patient appears nontoxic at this time and is comfortable  1:55 PM-she has been evaluated by general surgery, Dr. Henreitta Leber who states that; patient does not have acute gallbladder disease requiring intervention.  She will see the patient in the office in 2 weeks for further evaluation and treatment.  At this time patient remains comfortable and with normal vital signs.  Updated daughter on findings and plan, she is agreeable.  Patient is also comfortable and does not indicate any ongoing problems.  Plan to discharge patient, with prescription of Augmentin for 5 days, with instructions to follow-up with PCP for recheck urine in 1 week and general surgery for gallbladder assessment in 2 weeks.     Mancel Bale, MD 10/29/20 [phone]

## 2020-10-29 NOTE — Consult Note (Signed)
[facility] Consult  Reason for Consult: ? Cholecystitis  Referring Physician:  Dr. Effie Shy   Chief Complaint    Weakness      HPI: Christina Delgado is a 87 y.o. female with dementia, diabetes, HTN, who came to the hospital overnight with septic symptoms and worry from her daughter for a UTI. She had been urinating a lot recently and been to the ED and diagnosed with UTI.  She also had generalized abdominal pain, nausea and diarrhea, and a CT was done that demonstrated a distended gallbladder, US demonstrated some stones and possible thickening but was not conclusive for cholecystitis.    Dr. Effie Shy and I discussed and I ordered a HIDA scan to further evaluate so we could determine if she needed admission ad he reports her vitals are much improved and she is back to baseline.   She denies any RUQ pain or pain with eating. Her daughter [name] is very involved and her son is a Careers information officer.   Past Medical History:  Diagnosis Date  . Ankle fracture, left   . COVID winter 2021-2022  . Generalized osteoarthrosis, involving multiple sites   . Glaucoma   . Hepatitis, unspecified   . Hypertension   . Hypopotassemia   . Osteoporosis     Past Surgical History:  Procedure Laterality Date  . FRACTURE SURGERY Left       . REFRACTIVE SURGERY      Family History  Problem Relation Age of Onset  . Stroke Father 20  . Diabetes Sister   . Stroke Mother 74  . Diabetes Mother   . Stroke Brother   . Hypertension Sister   . Hyperlipidemia Sister   . Cancer Brother        LUNG  . Diabetes Brother     Social History   Tobacco Use  . Smoking status: Never Smoker  . Smokeless tobacco: Never Used  Vaping Use  . Vaping Use: Never used  Substance Use Topics  . Alcohol use: No  . Drug use: No    Medications: I have reviewed the patient's current medications. No current facility-administered medications for this encounter.   Current Outpatient Medications   Medication Sig Dispense Refill Last Dose  . amLODipine-benazepril (LOTREL) 5-10 MG capsule Take 1 capsule by mouth daily. 90 capsule 3   . brimonidine (ALPHAGAN) 0.15 % ophthalmic solution Place 1 drop into both eyes 2 (two) times daily.     . Calcium Carbonate-Vitamin D 600-400 MG-UNIT tablet Take 1 tablet by mouth daily.     . Cranberry 450 MG TABS Take as directed on package for Urinary health 50 tablet 12   . donepezil (ARICEPT) 10 MG tablet Take 1 tablet (10 mg total) by mouth at bedtime. 90 tablet 0   . DORZOLAMIDE HCL-TIMOLOL MAL OP Apply 1 drop to eye 2 (two) times daily.     Marland Kitchen latanoprost (XALATAN) 0.005 % ophthalmic solution Place 1 drop into the left eye at bedtime.     Marland Kitchen loratadine (CLARITIN) 10 MG tablet TAKE 1 TABLET DAILY 30 tablet 5   . Misc Natural Products (OSTEO BI-FLEX TRIPLE STRENGTH) TABS Take 4,000 each by mouth daily. Patient takes 2000 mg(2 tablets at one time daily)     . Multiple Vitamins-Minerals ([employer] SPECTRAVITE PO) Take 1 capsule by mouth daily.     . Omega-3 Fatty Acids (FISH OIL) 1200 MG CAPS Take 1,200 mg by mouth 3 (three) times daily.     . potassium chloride (  MICRO-K) 10 MEQ CR capsule Take 4 capsules (40 mEq total) by mouth daily. 360 capsule 3   . traZODone (DESYREL) 50 MG tablet Take 1 tablet (50 mg total) by mouth at bedtime as needed for sleep. 10 tablet 0     No Known Allergies   ROS:  A comprehensive review of systems was negative except for: Gastrointestinal: positive for abdominal pain, diarrhea and nausea  Blood pressure (!) 137/54, pulse 80, temperature 98.5 F (36.9 C), temperature source Rectal, resp. rate 15, SpO2 100 %. Physical Exam HENT:     Head: Normocephalic.  Eyes:     Extraocular Movements: Extraocular movements intact.  Cardiovascular:     Rate and Rhythm: Normal rate.  Pulmonary:     Effort: Pulmonary effort is normal.  Abdominal:     General: There is no distension.     Palpations: Abdomen is soft.     Comments:  Minimal RLQ tenderness, no RUQ tenderness  Musculoskeletal:        General: No swelling.  Skin:    General: Skin is warm.  Neurological:     Mental Status: She is alert. Mental status is at baseline.  Psychiatric:        Mood and Affect: Mood normal.     Results: Results for orders placed or performed during the hospital encounter of 10/29/20 (from the past 48 hour(s))  Urinalysis, Routine w reflex microscopic Urine, Clean Catch     Status: Abnormal   Collection Time: 10/29/20  3:12 AM  Result Value Ref Range   Color, Urine YELLOW YELLOW   APPearance HAZY (A) CLEAR   Specific Gravity, Urine 1.034 (H) 1.005 - 1.030   pH 5.0 5.0 - 8.0   Glucose, UA NEGATIVE NEGATIVE mg/dL   Hgb urine dipstick LARGE (A) NEGATIVE   Bilirubin Urine NEGATIVE NEGATIVE   Ketones, ur NEGATIVE NEGATIVE mg/dL   Protein, ur 30 (A) NEGATIVE mg/dL   Nitrite NEGATIVE NEGATIVE   Leukocytes,Ua LARGE (A) NEGATIVE   RBC / HPF >50 (H) 0 - 5 RBC/hpf   WBC, UA >50 (H) 0 - 5 WBC/hpf   Bacteria, UA NONE SEEN NONE SEEN   Squamous Epithelial / LPF 0-5 0 - 5   Uric Acid Crys, UA PRESENT     Comment: Performed at [facility], [address], [place], [place] [address]  Culture, blood (Routine x 2)     Status: None (Preliminary result)   Collection Time: 10/29/20  4:19 AM   Specimen: Vein; Blood  Result Value Ref Range   Specimen Description BLOOD LEFT HAND    Special Requests AEROBIC BOTTLE ONLY Blood Culture adequate volume    Culture      NO GROWTH < 12 HOURS Performed at [facility], [address] [place], [place] [address]    Report Status PENDING   Lactic acid, plasma     Status: Abnormal   Collection Time: 10/29/20  4:20 AM  Result Value Ref Range   Lactic Acid, Venous 3.9 (HH) 0.5 - 1.9 mmol/L    Comment: CRITICAL RESULT CALLED TO, READ BACK BY AND VERIFIED WITH: Lockie Pares AT 5:15AM ON 10/29/20 BY [facility] Performed at [facility], [address], [place], [place] [address]   Troponin I (High  Sensitivity)     Status: None   Collection Time: 10/29/20  4:21 AM  Result Value Ref Range   Troponin I (High Sensitivity) 4 <18 ng/L    Comment: (NOTE) Elevated high sensitivity troponin I (hsTnI) values and  significant  changes across serial measurements may suggest ACS but many other  chronic and acute conditions are known to elevate hsTnI results.  Refer to the "Links" section for chest pain algorithms and additional  guidance. Performed at [facility], [address] [place], [place] [address]   Comprehensive metabolic panel     Status: Abnormal   Collection Time: 10/29/20  4:34 AM  Result Value Ref Range   Sodium 143 135 - 145 mmol/L   Potassium 4.2 3.5 - 5.1 mmol/L   Chloride 111 98 - 111 mmol/L   CO2 20 (L) 22 - 32 mmol/L   Glucose, Bld 149 (H) 70 - 99 mg/dL    Comment: Glucose reference range applies only to samples taken after fasting for at least 8 hours.   BUN 12 8 - 23 mg/dL   Creatinine, Ser 6.040.95 0.44 - 1.00 mg/dL   Calcium 9.2 8.9 - 54.010.3 mg/dL   Total Protein 7.2 6.5 - 8.1 g/dL   Albumin 3.7 3.5 - 5.0 g/dL   AST 981126 (H) 15 - 41 U/L   ALT 70 (H) 0 - 44 U/L   Alkaline Phosphatase 54 38 - 126 U/L   Total Bilirubin 1.3 (H) 0.3 - 1.2 mg/dL   GFR, Estimated 58 (L) >60 mL/min    Comment: (NOTE) Calculated using the CKD-EPI Creatinine Equation (2021)    Anion gap 12 5 - 15    Comment: Performed at [facility], [address] [place], [place] [address]  CBC with Differential     Status: Abnormal   Collection Time: 10/29/20  4:34 AM  Result Value Ref Range   WBC 13.5 (H) 4.0 - 10.5 K/uL   RBC 4.31 3.87 - 5.11 MIL/uL   Hemoglobin 12.9 12.0 - 15.0 g/dL   HCT 82.942.9 56.236.0 - 13.046.0 %   MCV 99.5 80.0 - 100.0 fL   MCH 29.9 26.0 - 34.0 pg   MCHC 30.1 30.0 - 36.0 g/dL   RDW 86.513.2 78.411.5 - 69.615.5 %   Platelets 129 (L) 150 - 400 K/uL   nRBC 0.0 0.0 - 0.2 %   Neutrophils Relative % 80 %   Neutro Abs 10.8 (H) 1.7 - 7.7 K/uL   Lymphocytes Relative 11 %   Lymphs Abs 1.5 0.7 - 4.0  K/uL   Monocytes Relative 8 %   Monocytes Absolute 1.0 0.1 - 1.0 K/uL   Eosinophils Relative 0 %   Eosinophils Absolute 0.1 0.0 - 0.5 K/uL   Basophils Relative 0 %   Basophils Absolute 0.0 0.0 - 0.1 K/uL   Immature Granulocytes 1 %   Abs Immature Granulocytes 0.08 (H) 0.00 - 0.07 K/uL    Comment: Performed at [facility], [address] [place], [place] [address]  Protime-INR     Status: None   Collection Time: 10/29/20  4:34 AM  Result Value Ref Range   Prothrombin Time 14.5 11.4 - 15.2 seconds   INR 1.1 0.8 - 1.2    Comment: (NOTE) INR goal varies based on device and disease states. Performed at [facility], [address] [place], [place] [address]   Culture, blood (Routine x 2)     Status: None (Preliminary result)   Collection Time: 10/29/20  4:34 AM   Specimen: Vein; Blood  Result Value Ref Range   Specimen Description BLOOD RIGHT HAND    Special Requests AEROBIC BOTTLE ONLY Blood Culture adequate volume    Culture      NO GROWTH <12 HOURS Performed  at [facility], [address], [place], [place] [address]    Report Status PENDING   Lactic acid, plasma     Status: None   Collection Time: 10/29/20  6:10 AM  Result Value Ref Range   Lactic Acid, Venous 1.3 0.5 - 1.9 mmol/L    Comment: Performed at [facility], [address], [place], [place] [address]  Troponin I (High Sensitivity)     Status: None   Collection Time: 10/29/20  6:10 AM  Result Value Ref Range   Troponin I (High Sensitivity) 7 <18 ng/L    Comment: (NOTE) Elevated high sensitivity troponin I (hsTnI) values and significant  changes across serial measurements may suggest ACS but many other  chronic and acute conditions are known to elevate hsTnI results.  Refer to the "Links" section for chest pain algorithms and additional  guidance. Performed at [facility], [address], [place], [place] [address]    Personally reviewed -distended gallbladder with stones on CT and [place] CT ABDOMEN PELVIS W  CONTRAST  Result Date: 10/29/2020 CLINICAL DATA:  Acute abdominal pain.  Weakness. EXAM: CT ABDOMEN AND PELVIS WITH CONTRAST TECHNIQUE: Multidetector CT imaging of the abdomen and pelvis was performed using the standard protocol following bolus administration of intravenous contrast. CONTRAST:  OMNIPAQUE IOHEXOL 300 MG/ML  SOLN COMPARISON:  None. FINDINGS: Lower chest:  Coronary atherosclerosis. Hepatobiliary: Atrophy of the left lobe liver.Low-density wall thickening and mucosal hyperenhancement affecting the gallbladder, but no over distension or adjacent fat stranding. Cholelithiasis. Pancreas: Generalized atrophy. Spleen: Unremarkable. Adrenals/Urinary Tract: Negative adrenals. No hydronephrosis or ureteral stone. Punctate left upper pole renal calculus. Bladder wall thickening with mucosal hyperenhancement and pericystic indistinctness. Physiologic distension of the bladder. Stomach/Bowel:  No obstruction. No visible bowel inflammation. Vascular/Lymphatic: No acute vascular abnormality. Diffuse atheromatous plaque no mass or adenopathy. Reproductive:Fluid at the posterior fornix, nonspecific. No detected endometrial thickening. Other: No ascites or pneumoperitoneum. Musculoskeletal: Generalized osteopenia and ordinary lumbar spine degeneration. IMPRESSION: 1. Cystitis, please correlate with urinalysis. 2. Cholelithiasis and edematous thickening of the gallbladder wall, but no over distension or adjacent fat inflammation. Acute cholecystitis is not favored but please correlate with right upper quadrant exam. 3.  Aortic Atherosclerosis (ICD10-I70.0).  Coronary atherosclerosis. Electronically Signed   By: Marnee Spring M.D.   On: 10/29/2020 07:28   DG Chest Port 1 View  Result Date: 10/29/2020 CLINICAL DATA:  Weakness, nausea and vomiting, history of COVID-19 EXAM: PORTABLE CHEST 1 VIEW COMPARISON:  Radiograph 01/11/2019 FINDINGS: Chronically coarsened interstitial and bronchitic changes. No  consolidation, features of edema, pneumothorax, or effusion. The aorta is calcified. The remaining cardiomediastinal contours are unremarkable. Rounded density towards the left hilar point is likely vessel on end seen on priors. No acute osseous or soft tissue abnormality. Degenerative changes are present in the imaged spine and shoulders. Telemetry leads overlie the chest. IMPRESSION: No acute cardiopulmonary abnormality. Chronically coarsened interstitial and bronchitic changes. Electronically Signed   By: Kreg Shropshire M.D.   On: 10/29/2020 04:40   US Abdomen Limited RUQ (LIVER/GB)  Result Date: 10/29/2020 CLINICAL DATA:  Elevated liver function tests. Abdominal pain and weakness. Follow-up from current abdomen and pelvis CT. EXAM: ULTRASOUND ABDOMEN LIMITED RIGHT UPPER QUADRANT COMPARISON:  CT abdomen and pelvis, 10/29/2020 at 6:20 a.m. FINDINGS: Gallbladder: Dependent gallstones. There is wall thickening to 6 mm. Wall is irregular. Largest gallstone, 1.3 cm. Common bile duct: Diameter: 3 mm Liver: No focal lesion identified. Within normal limits in parenchymal echogenicity. Portal vein is patent on  color Doppler imaging with normal direction of blood flow towards the liver. Other: None. IMPRESSION: 1. Multiple gallstones with irregular gallbladder wall thickening. Gallbladder is mild-to-moderately distended. Findings support acute cholecystitis in the proper clinical setting. 2. No other abnormalities. Electronically Signed   By: Amie Portland M.D.   On: 10/29/2020 09:05   HIDA reviewed with Dr. Tyron Russell- reports no acute cholecystitis as gallbladder fills at 35 minutes, some chronic changes /chronic cholecystitis    Assessment & Plan:  RONEE RANGANATHAN is a 87 y.o. female with some gallstones and distended gallbladder with minimal wall thickening. She had some abdominal pain and nausea in the setting of a UTI. HIDA was obtained and demonstrates no acute cholecystitis. Discussed with her daughter and son.  Discussed symptoms of gallbladder and treatments for acute cholecystitis. Discussed that chronic changes are not always symptomatic. Discussed monitoring her symptoms. Discussed that she has been treated for UTI and is improved. Dr. Effie Shy thinks she will be able to dc with antibiotics for the UTI.   -Can follow up in clinic in 2 weeks to review symptoms and discuss chronic cholecystitis further -LFT mildly elevated likely from nausea/vomiting, lactic likely from UTI, blood cultures pending   Discussed with Dr. Effie Shy.  All questions were answered to the satisfaction of the patient and family.   Lucretia Roers 10/29/2020, 2:03 PM

## 2020-10-29 NOTE — ED Provider Notes (Signed)
[facility] EMERGENCY DEPARTMENT Provider Note   CSN: [id number] Arrival date & time: 10/29/20  0304     History Chief Complaint  Patient presents with  . Weakness    Christina Delgado is a 87 y.o. female.  Patient with a history of hypertension, osteoarthritis here with abdominal pain, nausea, vomiting and diarrhea.  She does have some dementia and majority of history is provided by her daughter Dois Delgado at bedside.  Dois Delgado states patient was awoken from sleep to have a bowel movement which was loose and had several episodes of dry heaving.  Complained of some diffuse abdominal pain which is since improved.  Dois Delgado states she was not able to get a pulse ox or blood pressure on the patient she does not appear well so she called EMS.  Patient denies any pain currently.  She denies any abdominal pain, chest pain, back pain.  Does not recall vomiting or having diarrhea.  No pain with urination or blood in the urine.  No fever. Dois Delgado was concerned about a possible UTI. No recent travel or sick contacts.  Her mental status seems to be at baseline  The history is provided by the patient, the EMS personnel and a relative.  Weakness Associated symptoms: abdominal pain, diarrhea, nausea and vomiting   Associated symptoms: no arthralgias, no dysuria, no fever, no headaches and no myalgias        Past Medical History:  Diagnosis Date  . Ankle fracture, left   . COVID winter 2021-2022  . Generalized osteoarthrosis, involving multiple sites   . Glaucoma   . Hepatitis, unspecified   . Hypertension   . Hypopotassemia   . Osteoporosis     Patient Active Problem List   Diagnosis Date Noted  . Osteoporosis with fracture 03/11/2014  . Hyperlipidemia with target LDL less than 100 12/17/2013  . Hypokalemia 12/17/2013  . BMI 24.0-24.9, adult 12/17/2013  . Hypertension 10/12/2010  . DJD (degenerative joint disease), lumbar 10/12/2010  . Glaucoma 10/12/2010  . Diabetes mellitus ([name])  10/12/2010  . Hepatitis 10/12/2010    Past Surgical History:  Procedure Laterality Date  . FRACTURE SURGERY Left       . REFRACTIVE SURGERY       OB History   No obstetric history on file.     Family History  Problem Relation Age of Onset  . Stroke Father 101  . Diabetes Sister   . Stroke Mother 50  . Diabetes Mother   . Stroke Brother   . Hypertension Sister   . Hyperlipidemia Sister   . Cancer Brother        LUNG  . Diabetes Brother     Social History   Tobacco Use  . Smoking status: Never Smoker  . Smokeless tobacco: Never Used  Vaping Use  . Vaping Use: Never used  Substance Use Topics  . Alcohol use: No  . Drug use: No    Home Medications Prior to Admission medications   Medication Sig Start Date End Date Taking? Authorizing Provider  amLODipine-benazepril (LOTREL) 5-10 MG capsule Take 1 capsule by mouth daily. 08/03/20   Raliegh Ip, DO  brimonidine (ALPHAGAN) 0.15 % ophthalmic solution Place 1 drop into both eyes 2 (two) times daily.    [provider]  Calcium Carbonate-Vitamin D 600-400 MG-UNIT tablet Take 1 tablet by mouth daily.    [provider]  Cranberry 450 MG TABS Take as directed on package for Urinary health 08/19/19   Raliegh Ip,  DO  donepezil (ARICEPT) 10 MG tablet Take 1 tablet (10 mg total) by mouth at bedtime. 08/03/20   Delynn Flavin M, DO  DORZOLAMIDE HCL-TIMOLOL MAL OP Apply 1 drop to eye 2 (two) times daily.    [provider]  latanoprost (XALATAN) 0.005 % ophthalmic solution Place 1 drop into the left eye at bedtime.    [provider]  loratadine (CLARITIN) 10 MG tablet TAKE 1 TABLET DAILY 09/23/20   Delynn Flavin M, DO  Misc Natural Products (OSTEO BI-FLEX TRIPLE STRENGTH) TABS Take 4,000 each by mouth daily. Patient takes 2000 mg(2 tablets at one time daily)    [provider]  Multiple Vitamins-Minerals ([employer] SPECTRAVITE PO) Take 1 capsule by mouth daily.    [provider]  Omega-3 Fatty Acids (FISH OIL) 1200 MG CAPS Take 1,200 mg by mouth 3 (three) times daily.    [provider]  potassium chloride (MICRO-K) 10 MEQ CR capsule Take 4 capsules (40 mEq total) by mouth daily. 08/03/20 11/01/20  Raliegh Ip, DO  traZODone (DESYREL) 50 MG tablet Take 1 tablet (50 mg total) by mouth at bedtime as needed for sleep. 10/21/20   Fayrene Helper, PA-C    Allergies    Patient has no known allergies.  Review of Systems   Review of Systems  Constitutional: Positive for activity change, appetite change and fatigue. Negative for fever.  HENT: Negative for congestion and rhinorrhea.   Gastrointestinal: Positive for abdominal pain, diarrhea, nausea and vomiting. Negative for anal bleeding.  Genitourinary: Negative for dysuria and hematuria.  Musculoskeletal: Negative for arthralgias and myalgias.  Skin: Negative for wound.  Neurological: Positive for weakness. Negative for headaches.   all other systems are negative except as noted in the HPI and PMH.    Physical Exam Updated Vital Signs BP (!) 98/51   Pulse 77   Temp 98.5 F (36.9 C) (Rectal)   Resp 17   SpO2 97%   Physical Exam Vitals and nursing note reviewed.  Constitutional:      General: She is not in acute distress.    Appearance: She is well-developed.     Comments: Clammy hands, cool extremities.  HENT:     Head: Normocephalic and atraumatic.     Mouth/Throat:     Pharynx: No oropharyngeal exudate.  Eyes:     Conjunctiva/sclera: Conjunctivae normal.     Pupils: Pupils are equal, round, and reactive to light.  Neck:     Comments: No meningismus. Cardiovascular:     Rate and Rhythm: Normal rate and regular rhythm.     Heart sounds: Normal heart sounds. No murmur heard.   Pulmonary:     Effort: Pulmonary effort is normal. No respiratory distress.     Breath sounds: Normal breath sounds.  Abdominal:     Palpations: Abdomen is soft.     Tenderness: There is no abdominal  tenderness. There is no guarding or rebound.  Musculoskeletal:        General: No tenderness. Normal range of motion.     Cervical back: Normal range of motion and neck supple.     Comments: No CVAT  Skin:    General: Skin is warm.  Neurological:     Mental Status: She is alert and oriented to person, place, and time.     Cranial Nerves: No cranial nerve deficit.     Motor: No abnormal muscle tone.     Coordination: Coordination normal.     Comments: No ataxia  on finger to nose bilaterally. No pronator drift. 5/5 strength throughout. CN 2-12 intact.Equal grip strength. Sensation intact.   Psychiatric:        Behavior: Behavior normal.     ED Results / Procedures / Treatments   Labs (all labs ordered are listed, but only abnormal results are displayed) Labs Reviewed  COMPREHENSIVE METABOLIC PANEL - Abnormal; Notable for the following components:      Result Value   CO2 20 (*)    Glucose, Bld 149 (*)    AST 126 (*)    ALT 70 (*)    Total Bilirubin 1.3 (*)    GFR, Estimated 58 (*)    All other components within normal limits  LACTIC ACID, PLASMA - Abnormal; Notable for the following components:   Lactic Acid, Venous 3.9 (*)    All other components within normal limits  CBC WITH DIFFERENTIAL/PLATELET - Abnormal; Notable for the following components:   WBC 13.5 (*)    Platelets 129 (*)    Neutro Abs 10.8 (*)    Abs Immature Granulocytes 0.08 (*)    All other components within normal limits  CULTURE, BLOOD (ROUTINE X 2)  CULTURE, BLOOD (ROUTINE X 2)  LACTIC ACID, PLASMA  PROTIME-INR  URINALYSIS, ROUTINE W REFLEX MICROSCOPIC  TROPONIN I (HIGH SENSITIVITY)  TROPONIN I (HIGH SENSITIVITY)    EKG EKG Interpretation  Date/Time:  Friday Oct 29 2020 03:26:01 EDT Ventricular Rate:  67 PR Interval:  67 QRS Duration: 95 QT Interval:  418 QTC Calculation: 442 R Axis:   -57 Text Interpretation: Sinus rhythm Short PR interval Left anterior fascicular block Nonspecific T  abnormalities, lateral leads No significant change was found Confirmed by Glynn Octaveancour, Lorenza Shakir [phone]([id number]) on 10/29/2020 6:54:40 AM   Radiology DG Chest Port 1 View  Result Date: 10/29/2020 CLINICAL DATA:  Weakness, nausea and vomiting, history of COVID-19 EXAM: PORTABLE CHEST 1 VIEW COMPARISON:  Radiograph 01/11/2019 FINDINGS: Chronically coarsened interstitial and bronchitic changes. No consolidation, features of edema, pneumothorax, or effusion. The aorta is calcified. The remaining cardiomediastinal contours are unremarkable. Rounded density towards the left hilar point is likely vessel on end seen on priors. No acute osseous or soft tissue abnormality. Degenerative changes are present in the imaged spine and shoulders. Telemetry leads overlie the chest. IMPRESSION: No acute cardiopulmonary abnormality. Chronically coarsened interstitial and bronchitic changes. Electronically Signed   By: Kreg ShropshirePrice  DeHay M.D.   On: 10/29/2020 04:40    Procedures Procedures   Medications Ordered in ED Medications  sodium chloride 0.9 % bolus 1,000 mL (has no administration in time range)    ED Course  I have reviewed the triage vital signs and the nursing notes.  Pertinent labs & imaging results that were available during my care of the patient were reviewed by me and considered in my medical decision making (see chart for details).    MDM Rules/Calculators/A&P                         Nausea, vomiting, abdominal pain.  Abdomen soft without peritoneal signs  Initial blood pressure was soft at 90 systolic.  Patient has a soft abdomen. IV fluids started and labs obtained.  Initial lactate elevated at 3.9, leukocytosis of 13.  Concern for possible sepsis.  Blood pressure has normalized.  Patient given IV fluids as well as IV Rocephin for suspected UTI Mild transaminitis noted.  No abdominal pain currently. We will proceed with CT abdomen pelvis for further evaluation  Patient denies complaints and states she  feels well.  Mildly tachycardic around 100.  Will give additional fluids.  Lactate has cleared.  Unclear whether she is truly septic but does have SIRS criteria including lactic acidosis, leukocytosis.  Still awaiting urinalysis.  CT scan pending for further evaluation of her abdominal pain and transaminitis.  Dr. Effie Shy to assume care. Final Clinical Impression(s) / ED Diagnoses Final diagnoses:  Sepsis [facility])    Rx / DC Orders ED Discharge Orders    None       Dorothye Berni, Jeannett Senior, MD 10/29/20 [phone]

## 2020-10-29 NOTE — Progress Notes (Signed)
[facility] Surgical  Radiology took one last shot of Gallbladder.  Hida with no filling into bowel likely from Npo state given no dilated duct or stone on [place] or CT. If worsening pain would need to come back to hospital and also need to repeat LFTs next week to ensure not increasing.   Would need MRCP if going up.  Discussed with Dr. Tyron Russell and Dr. Effie Shy and patient had already went home. I'll call [name] tomorr ow and let her know since she is already gone.  Algis Greenhouse, MD

## 2020-10-29 NOTE — ED Triage Notes (Signed)
Pt arrived via [facility] from home with c/o weakness that began last night and N&V. Pt received 4mg  of Zofran and of NS in route.

## 2020-10-29 NOTE — ED Notes (Signed)
ED Provider at bedside. 

## 2020-10-29 NOTE — ED Notes (Signed)
Pt. Ambulated to nursing station then back to room with a walker

## 2020-10-29 NOTE — Discharge Instructions (Signed)
The testing today indicates that you have a urinary tract infection.  We are sending a prescription to your pharmacy for ongoing management.  Make sure you are getting plenty of rest, drink a lot of fluids and try to eat regularly.  Follow-up with your PCP in 1 week for checkup , also, follow-up with a general surgeon for assessment of your gallbladder in 2 weeks.

## 2020-10-29 NOTE — ED Notes (Signed)
Pt discharged, IV dc;ed assisted into vehicle with care giver and daughter.

## 2020-10-30 ENCOUNTER — Telehealth (INDEPENDENT_AMBULATORY_CARE_PROVIDER_SITE_OTHER): Payer: Medicare PPO | Admitting: General Surgery

## 2020-10-30 DIAGNOSIS — R7989 Other specified abnormal findings of blood chemistry: Secondary | ICD-10-CM

## 2020-10-30 NOTE — Telephone Encounter (Signed)
[facility]  After patient left ED yesterday, Dr. Tyron Russell notified me that her HIDA scan had not emptied into her small bowel. Likely this is from NPO status but need to recheck LFTs to be sure.    [name] reports she is doing well, having no pain and having Bms.  She is going to take her to get labs on Monday and knows reasons to take her back to ED. Discussed that other reasons for not emptied could be a bile duct blockage from a stone which if the LFTs are up would require MRCP to be done to see if a stone is present.  She would prefer to get LFTs at Dr. Nadine Counts office on Monday since that is closer to their home.  Will send Dr. Nadine Counts a message for CMP on Monday. Will follow up. Plan to see patient in a few weeks to discuss any symptoms related to gallbladder.  My office is also CC'ed on this message.  Algis Greenhouse, MD [facility] [address] [place], [place] [address] [phone] (office)

## 2020-10-31 LAB — URINE CULTURE

## 2020-11-01 ENCOUNTER — Telehealth (INDEPENDENT_AMBULATORY_CARE_PROVIDER_SITE_OTHER): Payer: Medicare PPO | Admitting: General Surgery

## 2020-11-01 ENCOUNTER — Other Ambulatory Visit: Payer: Self-pay | Admitting: Family Medicine

## 2020-11-01 DIAGNOSIS — R7989 Other specified abnormal findings of blood chemistry: Secondary | ICD-10-CM

## 2020-11-01 NOTE — Telephone Encounter (Signed)
[facility]  Dr. Nadine Counts, Kathie Rhodes M, DO has ordered [facility] for repeat. Daughter says she is doing well and not having pain, having Bms but they are loose and brown (on Augmentin) and is not yellow/ jaundice.    She is going to take her to get labs in the AM because she is having trouble getting off work. This should be fine since no symptoms from patient.  Algis Greenhouse, MD [facility] [address] [place], [place] [address] [phone] (office)

## 2020-11-02 ENCOUNTER — Other Ambulatory Visit: Payer: Self-pay

## 2020-11-02 ENCOUNTER — Other Ambulatory Visit: Payer: Medicare PPO

## 2020-11-02 DIAGNOSIS — R7989 Other specified abnormal findings of blood chemistry: Secondary | ICD-10-CM

## 2020-11-02 LAB — CMP14+EGFR
ALT: 69 IU/L — ABNORMAL HIGH (ref 0–32)
AST: 34 IU/L (ref 0–40)
Albumin/Globulin Ratio: 1.2 (ref 1.2–2.2)
Albumin: 3.7 g/dL (ref 3.6–4.6)
Alkaline Phosphatase: 75 IU/L (ref 44–121)
BUN/Creatinine Ratio: 7 — ABNORMAL LOW (ref 12–28)
BUN: 4 mg/dL — ABNORMAL LOW (ref 8–27)
Bilirubin Total: 0.3 mg/dL (ref 0.0–1.2)
CO2: 26 mmol/L (ref 20–29)
Calcium: 9.6 mg/dL (ref 8.7–10.3)
Chloride: 103 mmol/L (ref 96–106)
Creatinine, Ser: 0.58 mg/dL (ref 0.57–1.00)
Globulin, Total: 3.1 g/dL (ref 1.5–4.5)
Glucose: 112 mg/dL — ABNORMAL HIGH (ref 65–99)
Potassium: 3.7 mmol/L (ref 3.5–5.2)
Sodium: 145 mmol/L — ABNORMAL HIGH (ref 134–144)
Total Protein: 6.8 g/dL (ref 6.0–8.5)
eGFR: 88 mL/min/{1.73_m2} (ref >59)

## 2020-11-02 LAB — CBC WITH DIFFERENTIAL/PLATELET
Basophils Absolute: 0 10*3/uL (ref 0.0–0.2)
Basos: 0 %
EOS (ABSOLUTE): 0.2 10*3/uL (ref 0.0–0.4)
Eos: 4 %
Hematocrit: 36.1 % (ref 34.0–46.6)
Hemoglobin: 11.8 g/dL (ref 11.1–15.9)
Immature Grans (Abs): 0 10*3/uL (ref 0.0–0.1)
Immature Granulocytes: 0 %
Lymphocytes Absolute: 2.3 10*3/uL (ref 0.7–3.1)
Lymphs: 40 %
MCH: 29.1 pg (ref 26.6–33.0)
MCHC: 32.7 g/dL (ref 31.5–35.7)
MCV: 89 fL (ref 79–97)
Monocytes Absolute: 0.3 10*3/uL (ref 0.1–0.9)
Monocytes: 6 %
Neutrophils Absolute: 2.9 10*3/uL (ref 1.4–7.0)
Neutrophils: 50 %
Platelets: 166 10*3/uL (ref 150–450)
RBC: 4.06 x10E6/uL (ref 3.77–5.28)
RDW: 12.4 % (ref 11.7–15.4)
WBC: 5.7 10*3/uL (ref 3.4–10.8)

## 2020-11-03 ENCOUNTER — Telehealth (INDEPENDENT_AMBULATORY_CARE_PROVIDER_SITE_OTHER): Payer: Medicare PPO | Admitting: General Surgery

## 2020-11-03 DIAGNOSIS — R7989 Other specified abnormal findings of blood chemistry: Secondary | ICD-10-CM

## 2020-11-03 LAB — CULTURE, BLOOD (ROUTINE X 2)
Culture: NO GROWTH
Culture: NO GROWTH
Special Requests: ADEQUATE
Special Requests: ADEQUATE

## 2020-11-03 NOTE — Telephone Encounter (Signed)
[facility]   Labwork improved. Daughter is going to call office to follow up.    Results for Christina Delgado, Christina Delgado (MRN [id number]) as of 11/03/2020 08:52  Ref. Range 11/02/2020 10:52  Sodium Latest Ref Range: 134 - 144 mmol/L 145 (H)  Potassium Latest Ref Range: 3.5 - 5.2 mmol/L 3.7  Chloride Latest Ref Range: 96 - 106 mmol/L 103  CO2 Latest Ref Range: 20 - 29 mmol/L 26  Glucose Latest Ref Range: 65 - 99 mg/dL 112 (H)  BUN Latest Ref Range: 8 - 27 mg/dL 4 (L)  Creatinine Latest Ref Range: 0.57 - 1.00 mg/dL 0.58  Calcium Latest Ref Range: 8.7 - 10.3 mg/dL 9.6  BUN/Creatinine Ratio Latest Ref Range: 12 - 28  7 (L)  eGFR Latest Ref Range: >59 mL/min/1.73 88  Alkaline Phosphatase Latest Ref Range: 44 - 121 IU/L 75  Albumin Latest Ref Range: 3.6 - 4.6 g/dL 3.7  Albumin/Globulin Ratio Latest Ref Range: 1.2 - 2.2  1.2  AST Latest Ref Range: 0 - 40 IU/L 34  ALT Latest Ref Range: 0 - 32 IU/L 69 (H)  Total Protein Latest Ref Range: 6.0 - 8.5 g/dL 6.8  Total Bilirubin Latest Ref Range: 0.0 - 1.2 mg/dL 0.3  Globulin, Total Latest Ref Range: 1.5 - 4.5 g/dL 3.1  WBC Latest Ref Range: 3.4 - 10.8 x10E3/uL 5.7  RBC Latest Ref Range: 3.77 - 5.28 x10E6/uL 4.06  Hemoglobin Latest Ref Range: 11.1 - 15.9 g/dL 11.8  HCT Latest Ref Range: 34.0 - 46.6 % 36.1  MCV Latest Ref Range: 79 - 97 fL 89  MCH Latest Ref Range: 26.6 - 33.0 pg 29.1  MCHC Latest Ref Range: 31.5 - 35.7 g/dL 32.7  RDW Latest Ref Range: 11.7 - 15.4 % 12.4  Platelets Latest Ref Range: 150 - 450 x10E3/uL Martin, MD [facility] [address] [place], [place] [address] [phone] (office)

## 2020-11-03 NOTE — Telephone Encounter (Signed)
Noted  

## 2020-11-11 ENCOUNTER — Encounter: Payer: Self-pay | Admitting: General Surgery

## 2020-11-11 ENCOUNTER — Other Ambulatory Visit: Payer: Self-pay

## 2020-11-11 ENCOUNTER — Ambulatory Visit (INDEPENDENT_AMBULATORY_CARE_PROVIDER_SITE_OTHER): Payer: Medicare PPO | Admitting: General Surgery

## 2020-11-11 VITALS — BP 133/67 | HR 76 | Temp 98.4°F | Resp 16 | Ht 62.0 in | Wt 167.0 lb

## 2020-11-11 DIAGNOSIS — K8010 Calculus of gallbladder with chronic cholecystitis without obstruction: Secondary | ICD-10-CM

## 2020-11-11 DIAGNOSIS — R7989 Other specified abnormal findings of blood chemistry: Secondary | ICD-10-CM | POA: Diagnosis not present

## 2020-11-11 NOTE — Progress Notes (Signed)
[facility] Note   HPI:  87 y.o. Female presents to clinic for follow-up evaluation of her gallbladder. She is here with her daughter and care giver. She denies any RUQ pain but has been seen holding her right side. . Patient reports no nausea or vomiting. Discussed that LFTs back to normal and HIDA did show some evidence of chronic inflammation.   Review of Systems:  No fevers No pain  All other review of systems: otherwise negative   Vital Signs:  BP 133/67   Pulse 76   Temp 98.4 F (36.9 C) (Other (Comment))   Resp 16   Ht 5\' 2"  (1.575 m)   Wt 167 lb (75.8 kg)   BMI 30.54 kg/m    Physical Exam:  Physical Exam Vitals reviewed.  Cardiovascular:     Rate and Rhythm: Normal rate.  Pulmonary:     Effort: Pulmonary effort is normal.  Abdominal:     General: There is no distension.     Palpations: Abdomen is soft.     Tenderness: There is no abdominal tenderness.  Neurological:     Mental Status: She is alert.     Assessment:  87 y.o. yo Female with some evidence of chronic cholecystitis on HIDA but doing well. Discussed that the gallbladder could cause her issues and what to look out for. For now they want to avoid surgery.  Plan:  Monitor for pain esp with food PRN follow up  All of the above recommendations were discussed with the patient and patient's family, and all of patient's and family's questions were answered to their expressed satisfaction.  97, MD [facility] [address] [place], [place] [phone] (office)

## 2020-11-11 NOTE — Patient Instructions (Signed)
Cholelithiasis  Cholelithiasis is a disease in which gallstones form in the gallbladder. The gallbladder is an organ that stores bile. Bile is a fluid that helps to digest fats. Gallstones begin as small crystals and can slowly grow into stones. They may cause no symptoms until they block the gallbladder duct, or cystic duct, when the gallbladder tightens (contracts) after food is eaten. This can cause pain and is known as a gallbladder attack, or biliary colic. There are two main types of gallstones:  Cholesterol stones. These are the most common type of gallstone. These stones are made of hardened cholesterol and are usually yellow-green in color. Cholesterol is a fat-like substance that is made in the liver.  Pigment stones. These are dark in color and are made of a red-yellow substance, called bilirubin,that forms when hemoglobin from red blood cells breaks down. What are the causes? This condition may be caused by an imbalance in the different parts that make bile. This can happen if the bile:  Has too much bilirubin. This can happen in certain blood diseases, such as sickle cell anemia.  Has too much cholesterol.  Does not have enough bile salts. These salts help the body absorb and digest fats. In some cases, this condition can also be caused by the gallbladder not emptying completely or often enough. This is common during pregnancy. What increases the risk? The following factors may make you more likely to develop this condition:  Being female.  Having multiple pregnancies. Health care providers sometimes advise removing diseased gallbladders before future pregnancies.  Eating a diet that is heavy in fried foods, fat, and refined carbohydrates, such as white bread and white rice.  Being obese.  Being older than age 40.  Using medicines that contain female hormones (estrogen) for a long time.  Losing weight quickly.  Having a family history of gallstones.  Having certain  medical problems, such as: ? Diabetes mellitus. ? Cystic fibrosis. ? Crohn's disease. ? Cirrhosis or other long-term (chronic) liver disease. ? Certain blood diseases, such as sickle cell anemia or leukemia. What are the signs or symptoms? In many cases, having gallstones causes no symptoms. When you have gallstones but do not have symptoms, you have silent gallstones. If a gallstone blocks your bile duct, it can cause a gallbladder attack. The main symptom of a gallbladder attack is sudden pain in the upper right part of the abdomen. The pain:  Usually comes at night or after eating.  Can last for one hour or more.  Can spread to your right shoulder, back, or chest.  Can feel like indigestion. This is discomfort, burning, or fullness in your upper abdomen. If the bile duct is blocked for more than a few hours, it can cause an infection or inflammation of your gallbladder (cholecystitis), liver, or pancreas. This can cause:  Nausea or vomiting.  Bloating.  Pain in your abdomen that lasts for 5 hours or longer.  Tenderness in your upper abdomen, often in the upper right section and under your rib cage.  Fever or chills.  Skin or the white parts of your eyes turning yellow (jaundice). This usually happens when a stone has blocked bile from passing through the common bile duct.  Dark urine or light-colored stools. How is this diagnosed? This condition may be diagnosed based on:  A physical exam.  Your medical history.  Ultrasound.  CT scan.  MRI. You may also have other tests, including:  Blood tests to check for signs of an   infection or inflammation.  Cholescintigraphy, or HIDA scan. This is a scan of your gallbladder and bile ducts (biliary system) using non-harmful radioactive material and special cameras that can see the radioactive material.  Endoscopic retrograde cholangiopancreatogram. This involves inserting a small tube with a camera on the end (endoscope)  through your mouth to look at bile ducts and check for blockages. How is this treated? Treatment for this condition depends on the severity of the condition. Silent gallstones do not need treatment. Treatment may be needed if a blockage causes a gallbladder attack or other symptoms. Treatment may include:  Home care, if symptoms are not severe. ? During a simple gallbladder attack, stop eating and drinking for 12-24 hours (except for water and clear liquids). This helps to "cool down" your gallbladder. After 1 or 2 days, you can start to eat a diet of simple or clear foods, such as broths and crackers. ? You may also need medicines for pain or nausea or both. ? If you have cholecystitis and an infection, you will need antibiotics.  A hospital stay, if needed for pain control or for cholecystitis with severe infection.  Cholecystectomy, or surgery to remove your gallbladder. This is the most common treatment if all other treatments have not worked.  Medicines to break up gallstones. These are most effective at treating small gallstones. Medicines may be used for up to 6-12 months.  Endoscopic retrograde cholangiopancreatogram. A small basket can be attached to the endoscope and used to capture and remove gallstones, mainly those that are in the common bile duct. Follow these instructions at home: Medicines  Take over-the-counter and prescription medicines only as told by your health care provider.  If you were prescribed an antibiotic medicine, take it as told by your health care provider. Do not stop taking the antibiotic even if you start to feel better.  Ask your health care provider if the medicine prescribed to you requires you to avoid driving or using machinery. Eating and drinking  Drink enough fluid to keep your urine pale yellow. This is important during a gallbladder attack. Water and clear liquids are preferred.  Follow a healthy diet. This includes: ? Reducing fatty foods,  such as fried food and foods high in cholesterol. ? Reducing refined carbohydrates, such as white bread and white rice. ? Eating more fiber. Aim for foods such as almonds, fruit, and beans. Alcohol use  If you drink alcohol: ? Limit how much you use to:  0-1 drink a day for nonpregnant women.  0-2 drinks a day for men. ? Be aware of how much alcohol is in your drink. In the [place] one drink equals one 12 oz bottle of beer (355 mL), one 5 oz glass of wine (148 mL), or one 1 oz glass of hard liquor (44 mL). General instructions  Do not use any products that contain nicotine or tobacco, such as cigarettes, e-cigarettes, and chewing tobacco. If you need help quitting, ask your health care provider.  Maintain a healthy weight.  Keep all follow-up visits as told by your health care provider. These may include consultations with a surgeon or specialist. This is important. Where to find more information  National Institute of Diabetes and Digestive and Kidney Diseases: www.niddk.nih.gov Contact a health care provider if:  You think you have had a gallbladder attack.  You have been diagnosed with silent gallstones and you develop pain in your abdomen or indigestion.  You begin to have attacks more often.  You have   dark urine or light-colored stools. Get help right away if:  You have pain from a gallbladder attack that lasts for more than 2 hours.  You have pain in your abdomen that lasts for more than 5 hours or is getting worse.  You have a fever or chills.  You have nausea and vomiting that do not go away.  You develop jaundice. Summary  Cholelithiasis is a disease in which gallstones form in the gallbladder.  This condition may be caused by an imbalance in the different parts that make bile. This can happen if your bile has too much bilirubin or cholesterol, or does not have enough bile salts.  Treatment for gallstones depends on the severity of the condition. Silent  gallstones do not need treatment.  If gallstones cause a gallbladder attack or other symptoms, treatment usually involves not eating or drinking anything. Treatment may also include pain medicines and antibiotics, and it sometimes includes a hospital stay.  Surgery to remove the gallbladder is common if all other treatments have not worked. This information is not intended to replace advice given to you by your health care provider. Make sure you discuss any questions you have with your health care provider. Document Revised: 05/05/2019 Document Reviewed: 05/05/2019 Elsevier Patient Education  2021 [employer] Inc[employer]  

## 2020-11-15 ENCOUNTER — Other Ambulatory Visit: Payer: Self-pay | Admitting: Family Medicine

## 2020-11-16 DIAGNOSIS — Z961 Presence of intraocular lens: Secondary | ICD-10-CM | POA: Diagnosis not present

## 2020-11-16 DIAGNOSIS — H2511 Age-related nuclear cataract, right eye: Secondary | ICD-10-CM | POA: Diagnosis not present

## 2020-11-16 DIAGNOSIS — H401133 Primary open-angle glaucoma, bilateral, severe stage: Secondary | ICD-10-CM | POA: Diagnosis not present

## 2020-11-23 DIAGNOSIS — B351 Tinea unguium: Secondary | ICD-10-CM | POA: Diagnosis not present

## 2020-11-23 DIAGNOSIS — M79676 Pain in unspecified toe(s): Secondary | ICD-10-CM | POA: Diagnosis not present

## 2020-11-24 ENCOUNTER — Encounter: Payer: Self-pay | Admitting: Family Medicine

## 2020-11-24 ENCOUNTER — Other Ambulatory Visit: Payer: Self-pay | Admitting: Family

## 2020-11-24 MED ORDER — DOXEPIN HCL 10 MG PO CAPS: 10.0000 mg | ORAL_CAPSULE | Freq: Every day | ORAL | 1 refills | Status: DC

## 2020-11-30 ENCOUNTER — Encounter: Payer: Self-pay | Admitting: *Deleted

## 2020-12-24 ENCOUNTER — Encounter: Payer: Self-pay | Admitting: Family Medicine

## 2020-12-24 ENCOUNTER — Other Ambulatory Visit: Payer: Self-pay

## 2020-12-24 ENCOUNTER — Ambulatory Visit: Payer: Medicare PPO | Admitting: Family Medicine

## 2020-12-24 VITALS — BP 119/65 | HR 82 | Temp 98.0°F | Ht 62.0 in | Wt 164.4 lb

## 2020-12-24 DIAGNOSIS — Z7409 Other reduced mobility: Secondary | ICD-10-CM | POA: Diagnosis not present

## 2020-12-24 DIAGNOSIS — G301 Alzheimer's disease with late onset: Secondary | ICD-10-CM | POA: Diagnosis not present

## 2020-12-24 DIAGNOSIS — Z789 Other specified health status: Secondary | ICD-10-CM

## 2020-12-24 DIAGNOSIS — N39498 Other specified urinary incontinence: Secondary | ICD-10-CM | POA: Diagnosis not present

## 2020-12-24 DIAGNOSIS — R3989 Other symptoms and signs involving the genitourinary system: Secondary | ICD-10-CM | POA: Diagnosis not present

## 2020-12-24 DIAGNOSIS — E119 Type 2 diabetes mellitus without complications: Secondary | ICD-10-CM

## 2020-12-24 DIAGNOSIS — Z23 Encounter for immunization: Secondary | ICD-10-CM | POA: Diagnosis not present

## 2020-12-24 DIAGNOSIS — F0280 Dementia in other diseases classified elsewhere without behavioral disturbance: Secondary | ICD-10-CM

## 2020-12-24 LAB — MICROSCOPIC EXAMINATION

## 2020-12-24 LAB — URINALYSIS, ROUTINE W REFLEX MICROSCOPIC
Bilirubin, UA: NEGATIVE
Glucose, UA: NEGATIVE
Ketones, UA: NEGATIVE
Nitrite, UA: NEGATIVE
Protein,UA: NEGATIVE
RBC, UA: NEGATIVE
Specific Gravity, UA: 1.01 (ref 1.005–1.030)
Urobilinogen, Ur: 0.2 mg/dL (ref 0.2–1.0)
pH, UA: 7 (ref 5.0–7.5)

## 2020-12-24 LAB — BAYER DCA HB A1C WAIVED: HB A1C (BAYER DCA - WAIVED): 5.7 % (ref <7.0)

## 2020-12-24 MED ORDER — MIRTAZAPINE 7.5 MG PO TABS: 7.5000 mg | ORAL_TABLET | Freq: Every day | ORAL | 0 refills | Status: DC

## 2020-12-24 NOTE — Progress Notes (Signed)
Subjective: CC: Alzheimer disease with insomnia PCP: Raliegh Ip, DO NAT:FTDD Christina Delgado is a 86 y.o. female presenting to clinic today for:  1.  Alzheimer'Christina dementia, insomnia The patient is brought to the office today by her daughter.  She notes that she is continue to have issues with sleepiness.  She is actually seen in the emergency department for seizure-like activity which was thought to be possibly to dehydration and urinary tract infection.  Lactic acidosis improved after hydration.  She was discharged home with some trazodone.  Her daughter notes that this seemed to improve some of her mood but in fact made her too sedated even at a quarter of a 50 mg dose.  About 3 weeks ago, she contacted the office for an alternative treatment and was prescribed doxepin.  Her daughter is only given her a couple of these pills because they to cause excessive sedation.  She does not feel that her mother has a good quality of life when she is overly sedated.  To summarize she is also treated with Aricept.   Her daughter denies any visual or auditory hallucinations.  No violent behavior.  Sometimes she "gets a little bit of an attitude" but she actually appreciates when her mother is like this because she seems to be more lively.  She has 24/7 care.  Her children are very involved in her care.  She does have some urinary incontinence issues such that she will need to use 2-3 pads per day for incontinence when she is really tired.  She otherwise is able to get to the bathroom without difficulty.  Currently purchasing incontinence supplies on her own  ROS: Per HPI  No Known Allergies Past Medical History:  Diagnosis Date   Ankle fracture, left    COVID winter 2021-2022   Generalized osteoarthrosis, involving multiple sites    Glaucoma    Hepatitis, unspecified    Hypertension    Hypopotassemia    Osteoporosis     Current Outpatient Medications:    amLODipine-benazepril (LOTREL) 5-10 MG  capsule, Take 1 capsule by mouth daily., Disp: 90 capsule, Rfl: 3   brimonidine (ALPHAGAN) 0.15 % ophthalmic solution, Place 1 drop into both eyes 2 (two) times daily., Disp: , Rfl:    Calcium Carbonate-Vitamin D 600-400 MG-UNIT tablet, Take 1 tablet by mouth daily., Disp: , Rfl:    Cranberry 450 MG TABS, Take as directed on package for Urinary health, Disp: 50 tablet, Rfl: 12   donepezil (ARICEPT) 10 MG tablet, Take 1 tablet (10 mg total) by mouth at bedtime., Disp: 90 tablet, Rfl: 0   DORZOLAMIDE HCL-TIMOLOL MAL OP, Apply 1 drop to eye 2 (two) times daily., Disp: , Rfl:    doxepin (SINEQUAN) 10 MG capsule, Take 1 capsule (10 mg total) by mouth at bedtime., Disp: 90 capsule, Rfl: 1   latanoprost (XALATAN) 0.005 % ophthalmic solution, Place 1 drop into the left eye at bedtime., Disp: , Rfl:    loratadine (CLARITIN) 10 MG tablet, TAKE 1 TABLET DAILY, Disp: 30 tablet, Rfl: 5   Misc Natural Products (OSTEO BI-FLEX TRIPLE STRENGTH) TABS, Take 4,000 each by mouth daily. Patient takes 2000 mg(2 tablets at one time daily), Disp: , Rfl:    Multiple Vitamins-Minerals ([employer] SPECTRAVITE PO), Take 1 capsule by mouth daily., Disp: , Rfl:    Omega-3 Fatty Acids (FISH OIL) 1200 MG CAPS, Take 1,200 mg by mouth 3 (three) times daily., Disp: , Rfl:    potassium chloride (MICRO-K) 10 MEQ  CR capsule, Take 4 capsules (40 mEq total) by mouth daily., Disp: 360 capsule, Rfl: 3 Social History   Socioeconomic History   Marital status: Married    Spouse name: Not on file   Number of children: 4   Years of education: 16   Highest education level: Bachelor'Christina degree (e.g., BA, AB, BS)  Occupational History   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  Tobacco Use   Smoking status: Never   Smokeless tobacco: Never  Vaping Use   Vaping Use: Never used  Substance and Sexual Activity   Alcohol use: No   Drug use: No   Sexual activity: Not Currently  Other Topics Concern   Not on file   Social History Narrative   Not on file   Social Determinants of Health   Financial Resource Strain: Not on file  Food Insecurity: Not on file  Transportation Needs: Not on file  Physical Activity: Not on file  Stress: Not on file  Social Connections: Not on file  Intimate Partner Violence: Not on file   Family History  Problem Relation Age of Onset   Stroke Father 57   Diabetes Sister    Stroke Mother 71   Diabetes Mother    Stroke Brother    Hypertension Sister    Hyperlipidemia Sister    Cancer Brother        LUNG   Diabetes Brother     Objective: Office vital signs reviewed. BP 119/65   Pulse 82   Temp 98 F (36.7 C)   Ht 5\' 2"  (1.575 m)   Wt 164 lb 6.4 oz (74.6 kg)   SpO2 100%   BMI 30.07 kg/m   Physical Examination:  General: Awake, sleepy appearing but arouses easily, No acute distress HEENT: Normal; mucous membranes are moist Cardio: regular rate and rhythm, S1S2 heard, no murmurs appreciated Pulm: clear to auscultation bilaterally, no wheezes, rhonchi or rales; normal work of breathing on room air Neuro: Sleepy.  Hard of hearing Psych: Mood is stable.  Patient is pleasant.  Does not appear to be responding to internal stimuli  Depression screen [facility] 2/9 09/27/2020 08/19/2019 02/17/2019  Decreased Interest 0 0 0  Down, Depressed, Hopeless 0 0 0  PHQ - 2 Score 0 0 0  Altered sleeping - 0 0  Tired, decreased energy - 0 0  Change in appetite - 0 0  Feeling bad or failure about yourself  - 0 0  Trouble concentrating - 0 0  Moving slowly or fidgety/restless - 0 0  Suicidal thoughts - 0 0  PHQ-9 Score - 0 0  Some recent data might be hidden   No flowsheet data found.  Assessment/ Plan: 87 y.o. female   Late onset Alzheimer'Christina dementia without behavioral disturbance ([name]) - Plan: mirtazapine (REMERON) 7.5 MG tablet, Urinalysis, Routine w reflex microscopic  Impaired mobility and ADLs  Suspected UTI - Plan: Urine Culture  Other urinary  incontinence  Diet-controlled diabetes mellitus ([name]) - Plan: Bayer DCA Hb A1c Waived, Basic Metabolic Panel  Need for shingles vaccine - Plan: Varicella-zoster vaccine IM (Shingrix)  Stop doxepin.  I worry about the interaction with Aricept a potential for precipitating arrhythmia.  I do not prefer to use doxepin in the elderly due to its anticholinergic side effects, excessive sedation and potential impact on her bladder.  She was instead placed on mirtazapine 7.5 mg daily.  Would like to recheck her in the next couple of  months, sooner if concerns arise.  Urinalysis did show some scant bacteria and white blood cells.  Uncertain if this is due to the nature in which this urine sample was brought in versus she has an unrecognized urinary tract infection.  Given her limitations due to dementia, I favor checking a culture and proceeding with treatment if it does indeed grow significant bacteria.  This was verbalized to her daughter  Will get incontinence supplies for the patient.  Plan for extra-large briefs and pads for overnight use.  Her diabetes remains diet controlled.  Check kidney function, electrolytes  Second shingles vaccination was administered today.  No orders of the defined types were placed in this encounter.  No orders of the defined types were placed in this encounter.    Raliegh Ip, DO [facility] [phone]

## 2020-12-24 NOTE — Patient Instructions (Signed)
You had labs performed today.  You will be contacted with the results of the labs once they are available, usually in the next 3 business days for routine lab work.  If you have an active my chart account, they will be released to your MyChart.  If you prefer to have these labs released to you via telephone, please let us know.  STOP Doxepin.  START Mirtazapine.  We discussed that alternative sleep aid would be Rozarem.

## 2020-12-25 LAB — BASIC METABOLIC PANEL
BUN/Creatinine Ratio: 11 — ABNORMAL LOW (ref 12–28)
BUN: 7 mg/dL — ABNORMAL LOW (ref 8–27)
CO2: 22 mmol/L (ref 20–29)
Calcium: 9.8 mg/dL (ref 8.7–10.3)
Chloride: 102 mmol/L (ref 96–106)
Creatinine, Ser: 0.65 mg/dL (ref 0.57–1.00)
Glucose: 97 mg/dL (ref 65–99)
Potassium: 4.5 mmol/L (ref 3.5–5.2)
Sodium: 143 mmol/L (ref 134–144)
eGFR: 85 mL/min/{1.73_m2} (ref >59)

## 2020-12-26 LAB — URINE CULTURE

## 2020-12-30 ENCOUNTER — Other Ambulatory Visit: Payer: Self-pay | Admitting: Family Medicine

## 2020-12-30 ENCOUNTER — Other Ambulatory Visit: Payer: Self-pay | Admitting: Family

## 2020-12-30 ENCOUNTER — Encounter: Payer: Self-pay | Admitting: Family Medicine

## 2020-12-30 MED ORDER — CEPHALEXIN 500 MG PO CAPS: 500.0000 mg | ORAL_CAPSULE | Freq: Two times a day (BID) | ORAL | 0 refills | Status: DC

## 2020-12-31 ENCOUNTER — Ambulatory Visit: Payer: Medicare PPO | Admitting: Family Medicine

## 2021-01-07 ENCOUNTER — Telehealth: Payer: Self-pay | Admitting: Family Medicine

## 2021-01-27 DIAGNOSIS — Z23 Encounter for immunization: Secondary | ICD-10-CM | POA: Diagnosis not present

## 2021-02-03 DIAGNOSIS — H6123 Impacted cerumen, bilateral: Secondary | ICD-10-CM | POA: Diagnosis not present

## 2021-02-03 DIAGNOSIS — H903 Sensorineural hearing loss, bilateral: Secondary | ICD-10-CM | POA: Diagnosis not present

## 2021-02-07 ENCOUNTER — Encounter: Payer: Self-pay | Admitting: Family Medicine

## 2021-02-10 DIAGNOSIS — E669 Obesity, unspecified: Secondary | ICD-10-CM | POA: Diagnosis not present

## 2021-02-10 DIAGNOSIS — H409 Unspecified glaucoma: Secondary | ICD-10-CM | POA: Diagnosis not present

## 2021-02-10 DIAGNOSIS — M8580 Other specified disorders of bone density and structure, unspecified site: Secondary | ICD-10-CM | POA: Diagnosis not present

## 2021-02-10 DIAGNOSIS — I739 Peripheral vascular disease, unspecified: Secondary | ICD-10-CM | POA: Diagnosis not present

## 2021-02-10 DIAGNOSIS — J301 Allergic rhinitis due to pollen: Secondary | ICD-10-CM | POA: Diagnosis not present

## 2021-02-10 DIAGNOSIS — K08109 Complete loss of teeth, unspecified cause, unspecified class: Secondary | ICD-10-CM | POA: Diagnosis not present

## 2021-02-10 DIAGNOSIS — R32 Unspecified urinary incontinence: Secondary | ICD-10-CM | POA: Diagnosis not present

## 2021-02-10 DIAGNOSIS — G309 Alzheimer's disease, unspecified: Secondary | ICD-10-CM | POA: Diagnosis not present

## 2021-02-10 DIAGNOSIS — I10 Essential (primary) hypertension: Secondary | ICD-10-CM | POA: Diagnosis not present

## 2021-02-17 ENCOUNTER — Other Ambulatory Visit: Payer: Self-pay | Admitting: Family Medicine

## 2021-02-17 DIAGNOSIS — J3089 Other allergic rhinitis: Secondary | ICD-10-CM

## 2021-02-22 DIAGNOSIS — I10 Essential (primary) hypertension: Secondary | ICD-10-CM | POA: Diagnosis not present

## 2021-02-22 DIAGNOSIS — M130 Polyarthritis, unspecified: Secondary | ICD-10-CM | POA: Diagnosis not present

## 2021-02-22 DIAGNOSIS — R2681 Unsteadiness on feet: Secondary | ICD-10-CM | POA: Diagnosis not present

## 2021-02-22 DIAGNOSIS — G309 Alzheimer's disease, unspecified: Secondary | ICD-10-CM | POA: Diagnosis not present

## 2021-03-09 ENCOUNTER — Telehealth: Payer: Self-pay | Admitting: Family Medicine

## 2021-03-09 NOTE — Telephone Encounter (Signed)
Spoke to patient daughter  she stated insurance came to home and done AWV

## 2021-03-19 ENCOUNTER — Other Ambulatory Visit: Payer: Self-pay | Admitting: Family Medicine

## 2021-03-19 DIAGNOSIS — G301 Alzheimer's disease with late onset: Secondary | ICD-10-CM

## 2021-03-19 DIAGNOSIS — F0280 Dementia in other diseases classified elsewhere without behavioral disturbance: Secondary | ICD-10-CM

## 2021-03-23 DIAGNOSIS — H401133 Primary open-angle glaucoma, bilateral, severe stage: Secondary | ICD-10-CM | POA: Diagnosis not present

## 2021-03-23 DIAGNOSIS — H2511 Age-related nuclear cataract, right eye: Secondary | ICD-10-CM | POA: Diagnosis not present

## 2021-03-23 DIAGNOSIS — Z961 Presence of intraocular lens: Secondary | ICD-10-CM | POA: Diagnosis not present

## 2021-04-08 ENCOUNTER — Other Ambulatory Visit: Payer: Self-pay | Admitting: Family

## 2021-04-28 ENCOUNTER — Encounter: Payer: Self-pay | Admitting: Family Medicine

## 2021-04-28 ENCOUNTER — Ambulatory Visit (INDEPENDENT_AMBULATORY_CARE_PROVIDER_SITE_OTHER): Payer: Medicare PPO | Admitting: Family Medicine

## 2021-04-28 ENCOUNTER — Other Ambulatory Visit: Payer: Self-pay

## 2021-04-28 DIAGNOSIS — J301 Allergic rhinitis due to pollen: Secondary | ICD-10-CM

## 2021-04-28 DIAGNOSIS — R059 Cough, unspecified: Secondary | ICD-10-CM

## 2021-04-28 MED ORDER — BENZONATATE 100 MG PO CAPS: 100.0000 mg | ORAL_CAPSULE | Freq: Three times a day (TID) | ORAL | 0 refills | Status: DC | PRN

## 2021-04-28 MED ORDER — LEVOCETIRIZINE DIHYDROCHLORIDE 5 MG PO TABS: 5.0000 mg | ORAL_TABLET | Freq: Every evening | ORAL | 1 refills | Status: DC

## 2021-04-28 NOTE — Progress Notes (Signed)
Virtual Visit via telephone Note Due to COVID-19 pandemic this visit was conducted virtually. This visit type was conducted due to national recommendations for restrictions regarding the COVID-19 Pandemic (e.g. social distancing, sheltering in place) in an effort to limit this patient's exposure and mitigate transmission in our community. All issues noted in this document were discussed and addressed.  A physical exam was not performed with this format.   I connected with Christina Delgado's daughter on 04/28/2021 at 1500 by telephone and verified that I am speaking with the correct person using two identifiers. Christina Delgado is currently located at home and family is currently with them during visit. The provider, Kari Baars, FNP is located in their office at time of visit.  I discussed the limitations, risks, security and privacy concerns of performing an evaluation and management service by telephone and the availability of in person appointments. I also discussed with the patient that there may be a patient responsible charge related to this service. The patient expressed understanding and agreed to proceed.  Subjective:  Patient ID: Christina Delgado, female    DOB: 09-Apr-1931, 87 y.o.   MRN: [id number]  Chief Complaint:  URI   HPI: Christina Delgado is a 87 y.o. female presenting on 04/28/2021 for URI   Daughter reports patient has postnasal drip, watery eyes, and dry cough.  States symptoms started yesterday and do not seem to be progressing.  No associated fever, chills, fatigue, or weakness.  States the cough did interrupt her sleep.  No known sick contacts.  Has been taking Claritin without symptom relief.  URI  This is a new problem. The current episode started yesterday. The problem has been unchanged. There has been no fever. Associated symptoms include coughing and rhinorrhea. Pertinent negatives include no abdominal pain, chest pain, congestion, diarrhea, dysuria, ear pain,  headaches, joint pain, joint swelling, nausea, neck pain, plugged ear sensation, rash, sinus pain, sneezing, sore throat, swollen glands, vomiting or wheezing. She has tried antihistamine for the symptoms. The treatment provided mild relief.    Relevant past medical, surgical, family, and social history reviewed and updated as indicated.  Allergies and medications reviewed and updated.   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  Tobacco Use   Smoking status: Never   Smokeless tobacco: Never  Vaping Use   Vaping Use: Never used  Substance and Sexual Activity   Alcohol use: No   Drug use: No   Sexual activity: Not Currently  Other Topics Concern   Not on file  Social History Narrative   Not on file   Social Determinants of Health   Financial Resource Strain: Not on file  Food Insecurity: Not on file  Transportation Needs: Not on file  Physical Activity: Not on file  Stress: Not on file  Social Connections: Not on file  Intimate Partner Violence: Not on file    Outpatient Encounter Medications as of 04/28/2021  Medication Sig   benzonatate (TESSALON) 100 MG capsule Take 1 capsule (100 mg total) by mouth 3 (three) times daily as needed for cough.   levocetirizine (XYZAL) 5 MG tablet Take 1 tablet (5 mg total) by mouth every evening.   amLODipine-benazepril (LOTREL)  5-10 MG capsule Take 1 capsule by mouth daily.   brimonidine (ALPHAGAN) 0.15 % ophthalmic solution Place 1 drop into both eyes 2 (two) times daily.   Calcium Carbonate-Vitamin D 600-400 MG-UNIT tablet Take 1 tablet by mouth daily.   Cranberry 450 MG TABS Take as directed on package for Urinary health   Cranberry-Vit C-Probiotic-Ca (GNP CRANBERRY) TABS Take as directed on package for Urinary health   donepezil (ARICEPT) 10 MG tablet Take 1 tablet (10 mg total) by mouth at bedtime.   DORZOLAMIDE HCL-TIMOLOL MAL OP Apply 1 drop to eye 2 (two) times daily.   dorzolamide-timolol (COSOPT) 22.3-6.8 MG/ML ophthalmic solution SMARTSIG:1 Drop(s) In Eye(s) Twice Daily PRN   latanoprost (XALATAN) 0.005 % ophthalmic solution Place 1 drop into the left eye at bedtime.   mirtazapine (REMERON) 7.5 MG tablet TAKE 1 TABLET AT BEDTIME FOR MOOD/SLEEP/APPETITE   Misc Natural Products (OSTEO BI-FLEX TRIPLE STRENGTH) TABS Take 4,000 each by mouth daily. Patient takes 2000 mg(2 tablets at one time daily)   Multiple Vitamins-Minerals ([employer] SPECTRAVITE PO) Take 1 capsule by mouth daily.   Omega-3 Fatty Acids (FISH OIL) 1200 MG CAPS Take 1,200 mg by mouth 3 (three) times daily.   potassium chloride (KLOR-CON) 10 MEQ tablet Take 20 mEq by mouth 2 (two) times daily.   potassium chloride (MICRO-K) 10 MEQ CR capsule Take 4 capsules (40 mEq total) by mouth daily.   [DISCONTINUED] cephALEXin (KEFLEX) 500 MG capsule Take 1 capsule (500 mg total) by mouth 2 (two) times daily.   [DISCONTINUED] loratadine (CLARITIN) 10 MG tablet TAKE 1 TABLET DAILY   No facility-administered encounter medications on file as of 04/28/2021.    No Known Allergies  Review of Systems  Constitutional:  Negative for activity change, appetite change, chills, diaphoresis, fatigue, fever and unexpected weight change.  HENT:  Positive for postnasal drip and rhinorrhea. Negative for congestion, ear pain, sinus pressure, sinus pain, sneezing and sore throat.    Eyes:  Positive for discharge (clear).  Respiratory:  Positive for cough. Negative for chest tightness, shortness of breath and wheezing.   Cardiovascular:  Negative for chest pain, palpitations and leg swelling.  Gastrointestinal:  Negative for abdominal pain, blood in stool, constipation, diarrhea, nausea and vomiting.  Endocrine: Negative.   Genitourinary:  Negative for decreased urine volume, difficulty urinating, dysuria, frequency and urgency.  Musculoskeletal:  Negative for arthralgias, joint pain, myalgias and neck pain.  Skin: Negative.  Negative for rash.  Allergic/Immunologic: Negative.   Neurological:  Negative for dizziness, weakness and headaches.  Hematological: Negative.   Psychiatric/Behavioral:  Positive for confusion (baseline). Negative for hallucinations, sleep disturbance and suicidal ideas.   All other systems reviewed and are negative.       Observations/Objective: No vital signs or physical exam, this was a telephone or virtual health encounter.  Pt alert and oriented, answers all questions appropriately, and able to speak in full sentences.    Assessment and Plan: Momina was seen today for uri.  Diagnoses and all orders for this visit:  Seasonal  allergic rhinitis due to pollen Cough in adult Symptoms consistent with allergic rhinitis.  Will change Claritin to Xyzal as patient has been on Claritin for a long time.  Tessalon Perles as needed for cough.  Patient aware to report any new, worsening, or persistent symptoms.  Follow-up as needed. -     benzonatate (TESSALON) 100 MG capsule; Take 1 capsule (100 mg total) by mouth 3 (three) times daily as needed for cough. -     levocetirizine (XYZAL) 5 MG tablet; Take 1 tablet (5 mg total) by mouth every evening.    Follow Up Instructions: Return if symptoms worsen or fail to improve.    I discussed the assessment and treatment plan with the patient. The patient was provided an opportunity to ask questions and  all were answered. The patient agreed with the plan and demonstrated an understanding of the instructions.   The patient was advised to call back or seek an in-person evaluation if the symptoms worsen or if the condition fails to improve as anticipated.  The above assessment and management plan was discussed with the patient. The patient verbalized understanding of and has agreed to the management plan. Patient is aware to call the clinic if they develop any new symptoms or if symptoms persist or worsen. Patient is aware when to return to the clinic for a follow-up visit. Patient educated on when it is appropriate to go to the emergency department.    I provided 13 minutes of non-face-to-face time during this encounter. The call started at 1500. The call ended at 1515. The other time was used for coordination of care.    Kari Baars, FNP-C [facility] [address] [place], [place] [address] [phone] 04/28/2021

## 2021-05-03 ENCOUNTER — Other Ambulatory Visit: Payer: Self-pay | Admitting: Family Medicine

## 2021-05-03 ENCOUNTER — Encounter: Payer: Self-pay | Admitting: Family Medicine

## 2021-05-03 DIAGNOSIS — R059 Cough, unspecified: Secondary | ICD-10-CM

## 2021-05-03 MED ORDER — PROMETHAZINE-DM 6.25-15 MG/5ML PO SYRP: 2.5000 mL | ORAL_SOLUTION | Freq: Four times a day (QID) | ORAL | 0 refills | Status: DC | PRN

## 2021-05-03 NOTE — Telephone Encounter (Signed)
Prometh DM sent in for prn use.  Caution SEDATION.  Alternatively, they could double the tessalon perles to 2 capsules twice daily

## 2021-05-03 NOTE — Telephone Encounter (Signed)
Spoke with daughter and She only wants this Message to go to Dr. Reece Agar. Mother was up coughing last night and wants to know if meds need to be changes. Also patient is schedule in COVID clinic tomorrow because daughter states she only wants her to see and MD. Please advise id you thing the meds Rakes called in need to be changed?

## 2021-05-03 NOTE — Telephone Encounter (Signed)
Pt daughter called and declined televisit with MMM today she stated pt needs to be seen by a Dr. Not a nurse practitioner

## 2021-05-04 ENCOUNTER — Ambulatory Visit: Payer: Medicare PPO | Admitting: Family Medicine

## 2021-05-07 DIAGNOSIS — R9431 Abnormal electrocardiogram [ECG] [EKG]: Secondary | ICD-10-CM | POA: Diagnosis not present

## 2021-05-07 DIAGNOSIS — J209 Acute bronchitis, unspecified: Secondary | ICD-10-CM | POA: Diagnosis not present

## 2021-05-07 DIAGNOSIS — R059 Cough, unspecified: Secondary | ICD-10-CM | POA: Diagnosis not present

## 2021-05-07 DIAGNOSIS — R0602 Shortness of breath: Secondary | ICD-10-CM | POA: Diagnosis not present

## 2021-05-07 DIAGNOSIS — Z20822 Contact with and (suspected) exposure to covid-19: Secondary | ICD-10-CM | POA: Diagnosis not present

## 2021-05-07 DIAGNOSIS — I444 Left anterior fascicular block: Secondary | ICD-10-CM | POA: Diagnosis not present

## 2021-05-18 ENCOUNTER — Other Ambulatory Visit: Payer: Self-pay | Admitting: Family

## 2021-05-22 ENCOUNTER — Encounter: Payer: Self-pay | Admitting: Family Medicine

## 2021-05-23 ENCOUNTER — Other Ambulatory Visit: Payer: Self-pay | Admitting: Family Medicine

## 2021-05-23 DIAGNOSIS — J301 Allergic rhinitis due to pollen: Secondary | ICD-10-CM

## 2021-05-23 DIAGNOSIS — R059 Cough, unspecified: Secondary | ICD-10-CM

## 2021-05-23 NOTE — Telephone Encounter (Signed)
Pt wants to be seen by DR. Reece Agar. Only she was offered appointment with other providers for today and declined

## 2021-05-23 NOTE — Progress Notes (Unsigned)
Please call [phone]

## 2021-05-24 ENCOUNTER — Encounter: Payer: Self-pay | Admitting: Family Medicine

## 2021-05-24 NOTE — Telephone Encounter (Signed)
I can double book my televisit to do a TELEVISIT/ Video visit with her tomorrow at 11am if she wants to do that since she's had extensive testing. That's all I have.

## 2021-05-25 ENCOUNTER — Ambulatory Visit: Payer: Medicare PPO | Admitting: Family Medicine

## 2021-05-25 DIAGNOSIS — B9689 Other specified bacterial agents as the cause of diseases classified elsewhere: Secondary | ICD-10-CM

## 2021-05-25 DIAGNOSIS — R059 Cough, unspecified: Secondary | ICD-10-CM | POA: Diagnosis not present

## 2021-05-25 DIAGNOSIS — J208 Acute bronchitis due to other specified organisms: Secondary | ICD-10-CM

## 2021-05-25 DIAGNOSIS — R0982 Postnasal drip: Secondary | ICD-10-CM

## 2021-05-25 MED ORDER — CEFDINIR 300 MG PO CAPS: 300.0000 mg | ORAL_CAPSULE | Freq: Two times a day (BID) | ORAL | 0 refills | Status: DC

## 2021-05-25 MED ORDER — PROMETHAZINE-DM 6.25-15 MG/5ML PO SYRP: 2.5000 mL | ORAL_SOLUTION | Freq: Four times a day (QID) | ORAL | 0 refills | Status: AC | PRN

## 2021-05-25 MED ORDER — AZELASTINE HCL 0.1 % NA SOLN: 1.0000 | Freq: Two times a day (BID) | NASAL | 12 refills | Status: AC

## 2021-05-25 NOTE — Progress Notes (Signed)
Telephone visit  Subjective: XK:GYJEH PCP: Raliegh Ip, DO UDJ:SHFW RODERICA CATHELL is a 87 y.o. female calls for telephone consult today. Patient provides verbal consent for consult held via phone.  Due to COVID-19 pandemic this visit was conducted virtually. This visit type was conducted due to national recommendations for restrictions regarding the COVID-19 Pandemic (e.g. social distancing, sheltering in place) in an effort to limit this patient's exposure and mitigate transmission in our community. All issues noted in this document were discussed and addressed.  A physical exam was not performed with this format.   Location of patient: home Location of provider: [facility] Others present for call: [address]  1. Cough The cough that patient had is better but it is still present at bedtime and this is disturbing her sleep and quality of life.  She has been getting benadryl and tylenol at bedtime which seemed to help.  They are concerned because her p.o. intake has been decreased over the last couple of days.  She seems to be weaker than normal.  No reported fevers, hemoptysis.  She has not complained of any sinus congestion or drainage but there is concern about postnasal drip triggering her cough.  She has thus far been treated with oral steroids, cough pills and cough syrup.  The cough syrup is somewhat helpful.  Though it is sedating and therefore the use has been limited.  She has not yet had any antibiotics for this issue.  She is tested negative for RSV, COVID and influenza but has had an influenza exposure since being tested.     ROS: Per HPI  No Known Allergies Past Medical History:  Diagnosis Date   Ankle fracture, left    COVID winter 2021-2022   Generalized osteoarthrosis, involving multiple sites    Glaucoma    Hepatitis, unspecified    Hypertension    Hypopotassemia    Osteoporosis     Current Outpatient Medications:    amLODipine-benazepril (LOTREL) 5-10 MG  capsule, Take 1 capsule by mouth daily., Disp: 90 capsule, Rfl: 3   benzonatate (TESSALON) 100 MG capsule, Take 1 capsule (100 mg total) by mouth 3 (three) times daily as needed for cough., Disp: 20 capsule, Rfl: 0   brimonidine (ALPHAGAN) 0.15 % ophthalmic solution, Place 1 drop into both eyes 2 (two) times daily., Disp: , Rfl:    Calcium Carbonate-Vitamin D 600-400 MG-UNIT tablet, Take 1 tablet by mouth daily., Disp: , Rfl:    Cranberry 450 MG TABS, Take as directed on package for Urinary health, Disp: 50 tablet, Rfl: 12   Cranberry-Vit C-Probiotic-Ca (GNP CRANBERRY) TABS, Take as directed on package for Urinary health, Disp: 200 tablet, Rfl: 0   donepezil (ARICEPT) 10 MG tablet, Take 1 tablet (10 mg total) by mouth at bedtime., Disp: 90 tablet, Rfl: 0   DORZOLAMIDE HCL-TIMOLOL MAL OP, Apply 1 drop to eye 2 (two) times daily., Disp: , Rfl:    dorzolamide-timolol (COSOPT) 22.3-6.8 MG/ML ophthalmic solution, SMARTSIG:1 Drop(s) In Eye(s) Twice Daily PRN, Disp: , Rfl:    latanoprost (XALATAN) 0.005 % ophthalmic solution, Place 1 drop into the left eye at bedtime., Disp: , Rfl:    levocetirizine (XYZAL) 5 MG tablet, Take 1 tablet (5 mg total) by mouth every evening., Disp: 30 tablet, Rfl: 1   mirtazapine (REMERON) 7.5 MG tablet, TAKE 1 TABLET AT BEDTIME FOR MOOD/SLEEP/APPETITE, Disp: 90 tablet, Rfl: 0   Misc Natural Products (OSTEO BI-FLEX TRIPLE STRENGTH) TABS, Take 4,000 each by mouth daily. Patient takes 63  mg(2 tablets at one time daily), Disp: , Rfl:    Multiple Vitamins-Minerals (CVS SPECTRAVITE PO), Take 1 capsule by mouth daily., Disp: , Rfl:    Omega-3 Fatty Acids (FISH OIL) 1200 MG CAPS, Take 1,200 mg by mouth 3 (three) times daily., Disp: , Rfl:    potassium chloride (KLOR-CON) 10 MEQ tablet, Take 20 mEq by mouth 2 (two) times daily., Disp: , Rfl:    potassium chloride (MICRO-K) 10 MEQ CR capsule, Take 4 capsules (40 mEq total) by mouth daily., Disp: 360 capsule, Rfl: 3    promethazine-dextromethorphan (PROMETHAZINE-DM) 6.25-15 MG/5ML syrup, Take 2.5 mLs by mouth 4 (four) times daily as needed for cough., Disp: 118 mL, Rfl: 0  Assessment/ Plan: 87 y.o. female   Acute bacterial bronchitis - Plan: azelastine (ASTELIN) 0.1 % nasal spray, cefdinir (OMNICEF) 300 MG capsule, promethazine-dextromethorphan (PROMETHAZINE-DM) 6.25-15 MG/5ML syrup  Postnasal discharge - Plan: azelastine (ASTELIN) 0.1 % nasal spray  Cough in adult - Plan: promethazine-dextromethorphan (PROMETHAZINE-DM) 6.25-15 MG/5ML syrup  Unable to treat her empirically with oral antibiotics.  There is concern that possibly this cough could be triggered by some postnasal drainage as well therefore aspirin has been prescribed.  Advised to be very careful with use of Benadryl in the elderly as this is risky.  Also advised to use this cough syrup sparingly as it also carries risk in the elderly.  P.o. hydration of course is encouraged and I believe that her family has been working towards increasing her p.o. intake.  I have scheduled her a 1 week follow-up on the eighth of the month for recheck.  Her family knows to reach out to me sooner should concerns arise  Start time: 9:42 am End time: 10:00am  Total time spent on patient care (including telephone call/ virtual visit): 18 minutes  Lya Holben Hulen Skains, DO [facility] [phone]

## 2021-05-30 ENCOUNTER — Encounter: Payer: Self-pay | Admitting: Family Medicine

## 2021-05-31 ENCOUNTER — Other Ambulatory Visit: Payer: Self-pay | Admitting: Family Medicine

## 2021-05-31 MED ORDER — BENZONATATE 100 MG PO CAPS: 100.0000 mg | ORAL_CAPSULE | Freq: Three times a day (TID) | ORAL | 0 refills | Status: DC | PRN

## 2021-06-02 ENCOUNTER — Ambulatory Visit: Payer: Medicare PPO | Admitting: Family Medicine

## 2021-06-02 ENCOUNTER — Ambulatory Visit (INDEPENDENT_AMBULATORY_CARE_PROVIDER_SITE_OTHER): Payer: Medicare PPO

## 2021-06-02 ENCOUNTER — Other Ambulatory Visit: Payer: Self-pay | Admitting: Family Medicine

## 2021-06-02 ENCOUNTER — Encounter: Payer: Self-pay | Admitting: Family Medicine

## 2021-06-02 VITALS — BP 110/70 | HR 81 | Temp 97.8°F | Ht 62.0 in | Wt 164.6 lb

## 2021-06-02 DIAGNOSIS — R052 Subacute cough: Secondary | ICD-10-CM | POA: Diagnosis not present

## 2021-06-02 DIAGNOSIS — R8290 Unspecified abnormal findings in urine: Secondary | ICD-10-CM | POA: Diagnosis not present

## 2021-06-02 DIAGNOSIS — R300 Dysuria: Secondary | ICD-10-CM | POA: Diagnosis not present

## 2021-06-02 DIAGNOSIS — R059 Cough, unspecified: Secondary | ICD-10-CM | POA: Diagnosis not present

## 2021-06-02 LAB — MICROSCOPIC EXAMINATION: RBC, Urine: NONE SEEN /hpf (ref 0–2)

## 2021-06-02 LAB — URINALYSIS, COMPLETE
Bilirubin, UA: NEGATIVE
Glucose, UA: NEGATIVE
Ketones, UA: NEGATIVE
Nitrite, UA: NEGATIVE
Protein,UA: NEGATIVE
Specific Gravity, UA: 1.015 (ref 1.005–1.030)
Urobilinogen, Ur: 0.2 mg/dL (ref 0.2–1.0)
pH, UA: 7.5 (ref 5.0–7.5)

## 2021-06-02 MED ORDER — BENZONATATE 100 MG PO CAPS: 100.0000 mg | ORAL_CAPSULE | Freq: Three times a day (TID) | ORAL | 1 refills | Status: DC | PRN

## 2021-06-02 NOTE — Progress Notes (Signed)
Subjective: IT:GPQDI PCP: Raliegh Ip, DO Christina Delgado is a 87 y.o. female presenting to clinic today for:  1. Cough Patient is brought to the office by her daughter.  Last week, we started [facility] for patient as her cough was persistent.  She does note that the medication, particularly the nasal spray seem to be very helpful.  She has been utilizing this every night at bedtime and the cough up until last night did seem to improve quite a bit.  However, she apparently started coughing overnight and the helper that stays with her gave her some cough syrup this morning.  She is subsequently very drowsy.  Her daughter notes that this was not at the recommendation of them as she does have Tessalon Perles at home that they prefer to use due to the nondrowsy nature of the medication.  Overall her mother seems to be resting better at nighttime.  No fevers reported.  No hemoptysis or brown sputum reported.  She does not appear to be short of breath.  The lowest O2 sat that was measured at home was 88% and this was over night during sleep.  She has noticed that her urine looks a little different and the patient recently had a couple of days of diarrhea so she just wants to make sure that there is no UTI happening.   ROS: Per HPI  No Known Allergies Past Medical History:  Diagnosis Date   Ankle fracture, left    COVID winter 2021-2022   Generalized osteoarthrosis, involving multiple sites    Glaucoma    Hepatitis, unspecified    Hypertension    Hypopotassemia    Osteoporosis     Current Outpatient Medications:    amLODipine-benazepril (LOTREL) 5-10 MG capsule, Take 1 capsule by mouth daily., Disp: 90 capsule, Rfl: 3   azelastine (ASTELIN) 0.1 % nasal spray, Place 1 spray into both nostrils 2 (two) times daily., Disp: 30 mL, Rfl: 12   benzonatate (TESSALON PERLES) 100 MG capsule, Take 1 capsule (100 mg total) by mouth 3 (three) times daily as needed., Disp: 20 capsule, Rfl: 0    brimonidine (ALPHAGAN) 0.15 % ophthalmic solution, Place 1 drop into both eyes 2 (two) times daily., Disp: , Rfl:    Calcium Carbonate-Vitamin D 600-400 MG-UNIT tablet, Take 1 tablet by mouth daily., Disp: , Rfl:    cefdinir (OMNICEF) 300 MG capsule, Take 1 capsule (300 mg total) by mouth 2 (two) times daily., Disp: 20 capsule, Rfl: 0   Cranberry 450 MG TABS, Take as directed on package for Urinary health, Disp: 50 tablet, Rfl: 12   Cranberry-Vit C-Probiotic-Ca (GNP CRANBERRY) TABS, Take as directed on package for Urinary health, Disp: 200 tablet, Rfl: 0   donepezil (ARICEPT) 10 MG tablet, Take 1 tablet (10 mg total) by mouth at bedtime., Disp: 90 tablet, Rfl: 0   DORZOLAMIDE HCL-TIMOLOL MAL OP, Apply 1 drop to eye 2 (two) times daily., Disp: , Rfl:    dorzolamide-timolol (COSOPT) 22.3-6.8 MG/ML ophthalmic solution, SMARTSIG:1 Drop(s) In Eye(s) Twice Daily PRN, Disp: , Rfl:    latanoprost (XALATAN) 0.005 % ophthalmic solution, Place 1 drop into the left eye at bedtime., Disp: , Rfl:    levocetirizine (XYZAL) 5 MG tablet, Take 1 tablet (5 mg total) by mouth every evening., Disp: 30 tablet, Rfl: 1   mirtazapine (REMERON) 7.5 MG tablet, TAKE 1 TABLET AT BEDTIME FOR MOOD/SLEEP/APPETITE, Disp: 90 tablet, Rfl: 0   Misc Natural Products (OSTEO BI-FLEX TRIPLE STRENGTH) TABS, Take  4,000 each by mouth daily. Patient takes 2000 mg(2 tablets at one time daily), Disp: , Rfl:    Multiple Vitamins-Minerals (CVS SPECTRAVITE PO), Take 1 capsule by mouth daily., Disp: , Rfl:    Omega-3 Fatty Acids (FISH OIL) 1200 MG CAPS, Take 1,200 mg by mouth 3 (three) times daily., Disp: , Rfl:    potassium chloride (MICRO-K) 10 MEQ CR capsule, Take 4 capsules (40 mEq total) by mouth daily., Disp: 360 capsule, Rfl: 3   promethazine-dextromethorphan (PROMETHAZINE-DM) 6.25-15 MG/5ML syrup, Take 2.5 mLs by mouth 4 (four) times daily as needed for cough., Disp: 118 mL, Rfl: 0 Social History   Socioeconomic History   Marital status:  Married    Spouse name: Not on file   Number of children: 4   Years of education: 16   Highest education level: Bachelor's degree (e.g., BA, AB, BS)  Occupational History   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  Tobacco Use   Smoking status: Never   Smokeless tobacco: Never  Vaping Use   Vaping Use: Never used  Substance and Sexual Activity   Alcohol use: No   Drug use: No   Sexual activity: Not Currently  Other Topics Concern   Not on file  Social History Narrative   Not on file   Social Determinants of Health   Financial Resource Strain: Not on file  Food Insecurity: Not on file  Transportation Needs: Not on file  Physical Activity: Not on file  Stress: Not on file  Social Connections: Not on file  Intimate Partner Violence: Not on file   Family History  Problem Relation Age of Onset   Stroke Father 53   Diabetes Sister    Stroke Mother 82   Diabetes Mother    Stroke Brother    Hypertension Sister    Hyperlipidemia Sister    Cancer Brother        LUNG   Diabetes Brother     Objective: Office vital signs reviewed. BP 110/70   Pulse 81   Temp 97.8 F (36.6 C) (Temporal)   Ht 5\' 2"  (1.575 m)   Wt 164 lb 9.6 oz (74.7 kg)   SpO2 96%   BMI 30.11 kg/m   Physical Examination:  General: Drowsy but easily rouses HEENT: Sclera white.  Mucous membranes mildly dry. Cardio: regular rate and rhythm, S1S2 heard, no murmurs appreciated Pulm: Good air movement with some appreciable transmitted upper airway sounds throughout.  Normal work of breathing on room air.  Has coughed x1 during exam.  Cough sounded wet  DG Chest 1 View  Result Date: 06/02/2021 CLINICAL DATA:  Cough. EXAM: CHEST  1 VIEW COMPARISON:  Oct 29, 2020. FINDINGS: The heart size and mediastinal contours are within normal limits. Both lungs are clear. The visualized skeletal structures are unremarkable. IMPRESSION: No active disease. Electronically Signed   By: Oct 31, 2020 M.D.   On: 06/02/2021 11:51    Assessment/ Plan: 87 y.o. female   Subacute cough - Plan: benzonatate (TESSALON PERLES) 100 MG capsule, CANCELED: DG Chest 2 View  Cloudy urine - Plan: Urinalysis, Complete, Urine Culture, Microscopic Examination  I personally reviewed her chest x-ray and there was a question of some streaking along the left side of the heart that may have been an infiltrate.  However, radiology review the notes no pulmonary infiltrates or cardiopulmonary processes with this 1 view x-ray.  2 views unfortunately were not able to be obtained  due to patient's inability to stand independently.  Her exam was remarkable for transmitted upper airway sounds.  She had normal pulse ox and respiratory rate.  I suspect that much of the cough is precipitated by upper airway/sinus drainage.  Encouraged ongoing use of oral antihistamine at nighttime and Astelin nasal spray, which they can split as 1 spray in each nostril twice daily if that is more helpful.  Would certainly avoid use of that promethazine cough syrup as it clearly causes excessive sedation.  Recommend rather use of the [employer], which I have renewed today.  We discussed red flag signs and symptoms warranting further evaluation.  Her daughter will either message or call me next week with an update.  Her urinalysis did show few bacteria and I sent this for urine culture.  I suspect that even if she does have a UTI the Omnicef should cover this.  I would like her to continue the antibiotic to complete her 10-day course.  No orders of the defined types were placed in this encounter.  No orders of the defined types were placed in this encounter.    Raliegh Ip, DO [facility] [phone]

## 2021-06-03 ENCOUNTER — Encounter: Payer: Self-pay | Admitting: Family Medicine

## 2021-06-03 DIAGNOSIS — R052 Subacute cough: Secondary | ICD-10-CM

## 2021-06-03 MED ORDER — BENZONATATE 100 MG PO CAPS: 200.0000 mg | ORAL_CAPSULE | Freq: Three times a day (TID) | ORAL | 1 refills | Status: DC | PRN

## 2021-06-03 NOTE — Telephone Encounter (Signed)
Yes

## 2021-06-06 ENCOUNTER — Other Ambulatory Visit: Payer: Self-pay | Admitting: Family Medicine

## 2021-06-06 DIAGNOSIS — R052 Subacute cough: Secondary | ICD-10-CM

## 2021-06-06 LAB — URINE CULTURE

## 2021-06-12 ENCOUNTER — Encounter: Payer: Self-pay | Admitting: Family Medicine

## 2021-06-13 ENCOUNTER — Other Ambulatory Visit: Payer: Self-pay | Admitting: Family Medicine

## 2021-06-13 ENCOUNTER — Telehealth: Payer: Self-pay | Admitting: Family Medicine

## 2021-06-13 DIAGNOSIS — R052 Subacute cough: Secondary | ICD-10-CM

## 2021-06-13 MED ORDER — BENZONATATE 100 MG PO CAPS: 200.0000 mg | ORAL_CAPSULE | Freq: Three times a day (TID) | ORAL | 1 refills | Status: DC | PRN

## 2021-06-13 NOTE — Telephone Encounter (Signed)
Please advise on refill request there are MyChart messages from daughter as well, they maybe in Dr. Delynn Flavin in basket.

## 2021-06-13 NOTE — Telephone Encounter (Signed)
Patients daughter aware

## 2021-06-13 NOTE — Telephone Encounter (Signed)
°  Prescription Request  06/13/2021  Is this a "Controlled Substance" medicine? no  Have you seen your PCP in the last 2 weeks? 06/02/21  If YES, route message to pool  -  If NO, patient needs to be scheduled for appointment.  What is the name of the medication or equipment? Tessalon perles  Have you contacted your pharmacy to request a refill? Yes    Which pharmacy would you like this sent to? [facility]   Patient notified that their request is being sent to the clinical staff for review and that they should receive a response within 2 business days.

## 2021-06-13 NOTE — Telephone Encounter (Signed)
Ok to refill tessalon perles.

## 2021-06-13 NOTE — Addendum Note (Signed)
Addended by: Quay Burow on: 06/13/2021 01:37 PM   Modules accepted: Orders

## 2021-06-14 ENCOUNTER — Ambulatory Visit: Payer: Medicare PPO | Admitting: Allergy and Immunology

## 2021-06-14 ENCOUNTER — Encounter: Payer: Self-pay | Admitting: Allergy and Immunology

## 2021-06-14 ENCOUNTER — Other Ambulatory Visit: Payer: Self-pay

## 2021-06-14 VITALS — BP 126/74 | HR 82 | Resp 16

## 2021-06-14 DIAGNOSIS — K219 Gastro-esophageal reflux disease without esophagitis: Secondary | ICD-10-CM | POA: Diagnosis not present

## 2021-06-14 DIAGNOSIS — J4530 Mild persistent asthma, uncomplicated: Secondary | ICD-10-CM

## 2021-06-14 DIAGNOSIS — J3089 Other allergic rhinitis: Secondary | ICD-10-CM

## 2021-06-14 DIAGNOSIS — Z79899 Other long term (current) drug therapy: Secondary | ICD-10-CM

## 2021-06-14 MED ORDER — TRIAMCINOLONE ACETONIDE 55 MCG/ACT NA AERO: 1.0000 | INHALATION_SPRAY | Freq: Two times a day (BID) | NASAL | 5 refills | Status: DC

## 2021-06-14 MED ORDER — LEVOCETIRIZINE DIHYDROCHLORIDE 5 MG PO TABS: 5.0000 mg | ORAL_TABLET | Freq: Every evening | ORAL | 5 refills | Status: DC

## 2021-06-14 MED ORDER — MONTELUKAST SODIUM 10 MG PO TABS: 10.0000 mg | ORAL_TABLET | Freq: Every day | ORAL | 5 refills | Status: DC

## 2021-06-14 MED ORDER — OMEPRAZOLE 40 MG PO CPDR: 40.0000 mg | DELAYED_RELEASE_CAPSULE | Freq: Two times a day (BID) | ORAL | 5 refills | Status: DC

## 2021-06-14 NOTE — Progress Notes (Signed)
[place] - [place] - [place] - [place] - [place]   Dear Dr. Nadine Counts,  Thank you for referring Christina Delgado to the [facility] [place] on 06/14/2021.   Below is a summation of this patient's evaluation and recommendations.  Thank you for your referral. I will keep you informed about this patient's response to treatment.   If you have any questions please do not hesitate to contact me.   Sincerely,  Jessica Priest, MD Allergy / Immunology [place]   ______________________________________________________________________    NEW PATIENT NOTE  Referring Provider: Raliegh Ip, DO Primary Provider: Raliegh Ip, DO Date of office visit: 06/14/2021    Subjective:   Chief Complaint:  Christina Delgado (DOB: Mar 01, 1931) is a 87 y.o. female who presents to the clinic on 06/14/2021 with a chief complaint of Allergic Rhinitis , Cough, and Asthma .     HPI: Janneth presents to this clinic in evaluation of cough.  She is here today with her caretaker and her daughter.  Her daughter provides most of the history.  Apparently over the course of the past 2 months or so she has had a persistent cough.  This started with a episode of rhinitis with nasal congestion and clear rhinorrhea and she has been having coughing ever since.  Interestingly, she has lots of throat clearing and postnasal drip.  She has also been choking on her food and drink on occasion.  She does not have any ugly nasal discharge and there does not appear to be a tremendous amount of nasal congestion at this point.  She does not have any posttussive emesis.  She does not have a history of reflux.  She did have a good response or cough when she was treated with a "shot" and given prednisone for 5 days.  But then she relapsed after several days.  She has had a chest x-ray which has been normal.  She does have a  distant history of asthma and "bronchitis" which has been relatively inactive for a prolonged period in time.  She is on an ACE inhibitor.  Past Medical History:  Diagnosis Date   Ankle fracture, left    Asthma    COVID winter 2021-2022   Generalized osteoarthrosis, involving multiple sites    Glaucoma    Hepatitis, unspecified    Hypertension    Hypopotassemia    Osteoporosis     Past Surgical History:  Procedure Laterality Date   FRACTURE SURGERY Left        REFRACTIVE SURGERY      Allergies as of 06/14/2021   No Known Allergies      Medication List    amLODipine-benazepril 5-10 MG capsule Commonly known as: LOTREL Take 1 capsule by mouth daily.   azelastine 0.1 % nasal spray Commonly known as: ASTELIN Place 1 spray into both nostrils 2 (two) times daily.   benzonatate 100 MG capsule Commonly known as: Tessalon Perles Take 2 capsules (200 mg total) by mouth 3 (three) times daily as needed.   brimonidine 0.15 % ophthalmic solution Commonly known as: ALPHAGAN Place 1 drop into both eyes 2 (two) times daily.   Calcium Carbonate-Vitamin D 600-400 MG-UNIT tablet Take 1 tablet by mouth daily.   Cranberry 450 MG Tabs Take as directed on package for Urinary health   CVS SPECTRAVITE PO Take 1 capsule by mouth daily.   donepezil 10 MG tablet Commonly  known as: Aricept Take 1 tablet (10 mg total) by mouth at bedtime.   DORZOLAMIDE HCL-TIMOLOL MAL OP Apply 1 drop to eye 2 (two) times daily.   dorzolamide-timolol 22.3-6.8 MG/ML ophthalmic solution Commonly known as: COSOPT SMARTSIG:1 Drop(s) In Eye(s) Twice Daily PRN   Fish Oil 1200 MG Caps Take 1,200 mg by mouth 3 (three) times daily.   latanoprost 0.005 % ophthalmic solution Commonly known as: XALATAN Place 1 drop into the left eye at bedtime.   levocetirizine 5 MG tablet Commonly known as: XYZAL Take 1 tablet (5 mg total) by mouth every evening.   mirtazapine 7.5 MG tablet Commonly known as:  REMERON TAKE 1 TABLET AT BEDTIME FOR MOOD/SLEEP/APPETITE   Osteo Bi-Flex Triple Strength Tabs Take 4,000 each by mouth daily. Patient takes 2000 mg(2 tablets at one time daily)   potassium chloride 10 MEQ CR capsule Commonly known as: MICRO-K Take 4 capsules (40 mEq total) by mouth daily.   promethazine-dextromethorphan 6.25-15 MG/5ML syrup Commonly known as: PROMETHAZINE-DM Take 2.5 mLs by mouth 4 (four) times daily as needed for cough.    Review of systems negative except as noted in HPI / PMHx or noted below:  Review of Systems  Constitutional: Negative.   HENT: Negative.    Eyes: Negative.   Respiratory: Negative.    Cardiovascular: Negative.   Gastrointestinal: Negative.   Genitourinary: Negative.   Musculoskeletal: Negative.   Skin: Negative.   Neurological: Negative.   Endo/Heme/Allergies: Negative.   Psychiatric/Behavioral: Negative.     Family History  Problem Relation Age of Onset   Stroke Father 29   Diabetes Sister    Stroke Mother 42   Diabetes Mother    Stroke Brother    Hypertension Sister    Hyperlipidemia Sister    Cancer Brother        LUNG   Diabetes Brother     Social History   Socioeconomic History   Marital status: Widowed    Spouse name: Not on file   Number of children: 4   Years of education: 16   Highest education level: Bachelor's degree (e.g., BA, AB, BS)  Occupational History   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  Tobacco Use   Smoking status: Never    Passive exposure: Never   Smokeless tobacco: Never  Vaping Use   Vaping Use: Never used  Substance and Sexual Activity   Alcohol use: No   Drug use: No   Sexual activity: Not Currently  Other Topics Concern   Not on file  Social History Narrative   Not on file   Environmental and Social history  Lives in a house with a dry environment, and no animals look inside the household, hardwood in the bedroom, no plastic on the bed, no  plastic on the pillow, no smoking ongoing with inside the household.   Objective:   Vitals:   06/14/21 1452  BP: 126/74  Pulse: 82  Resp: 16  SpO2: 98%        Physical Exam Constitutional:      Appearance: She is not diaphoretic.     Comments: Wheelchair.  Answers questions with relatively short answers.  HENT:     Head: Normocephalic.     Comments: Hearing aid    Right Ear: External ear normal.     Left Ear: External ear normal.     Nose: Nose normal. No mucosal edema or rhinorrhea.     Mouth/Throat:  Pharynx: Uvula midline. No oropharyngeal exudate.  Eyes:     Conjunctiva/sclera: Conjunctivae normal.  Neck:     Thyroid: No thyromegaly.     Trachea: Trachea normal. No tracheal tenderness or tracheal deviation.  Cardiovascular:     Rate and Rhythm: Normal rate and regular rhythm.     Heart sounds: Normal heart sounds, S1 normal and S2 normal. No murmur heard. Pulmonary:     Effort: No respiratory distress.     Breath sounds: Normal breath sounds. No stridor. No wheezing or rales.  Lymphadenopathy:     Head:     Right side of head: No tonsillar adenopathy.     Left side of head: No tonsillar adenopathy.     Cervical: No cervical adenopathy.  Skin:    Findings: No erythema or rash.     Nails: There is no clubbing.  Neurological:     Mental Status: She is alert.    Diagnostics: Allergy skin tests were performed.   The heart size and mediastinal contours are within normal limits. Both lungs are clear. The visualized skeletal structures are unremarkable.  Results of blood tests obtained 02 Nov 2020 identifies WBC 5.7, absolute eosinophil 200, absolute lymphocyte 2300, hemoglobin 11.8, platelet 166.   Assessment and Plan:    1. Not well controlled mild persistent asthma   2. Perennial allergic rhinitis   3. LPRD (laryngopharyngeal reflux disease)   4. On angiotensin-converting enzyme (ACE) inhibitors     1.  Treat and prevent inflammation:  A. OTC  Nasacort - 1 spray each nostril 2 times per day B. Montelukast 10 mg - 1 tablet 1 time per day  2.  Treat and prevent reflux/LPR:  A. Minimize caffeine and chocolate consumption B. Omeprazole 40 mg - 1 tablet 2 times per day  3.  Have primary care doctor remove use of ACE inhibitor.  4.  If needed:  A. Xyzal 5 mg - 1 tablet 1 time per day  5.  Return to clinic in 4 weeks or earlier if problem  6.  Further evaluation for choking???  Kimyata has an inflamed and irritated respiratory track and thus she is coughing.  We will treat her for to the most common forms of inflammation affecting her airway which will be some eosinophilic infiltration of her airway and reflux induced respiratory disease with the therapy noted above.  She is also on an ACE inhibitor and that drug needs to be eliminated from her medical regime as this may also be contributing to her cough.  She does have a history suggesting some coughing and she may require a barium swallow or modified barium swallow if she does not improve with the therapy noted above over the course of the next several weeks.  I will see her back in this clinic in 4 weeks or earlier if there is a problem.  Jessica Priest, MD Allergy / Immunology [facility] [place]

## 2021-06-14 NOTE — Patient Instructions (Addendum)
°  1.  Treat and prevent inflammation:  A. OTC Nasacort - 1 spray each nostril 2 times per day B. Montelukast 10 mg - 1 tablet 1 time per day  2.  Treat and prevent reflux/LPR:  A. Minimize caffeine and chocolate consumption B. Omeprazole 40 mg - 1 tablet 2 times per day  3.  Have primary care doctor remove use of ACE inhibitor.  4.  If needed:  A. Xyzal 5 mg - 1 tablet 1 time per day  5.  Return to clinic in 4 weeks or earlier if problem  6.  Further evaluation for choking???

## 2021-06-15 ENCOUNTER — Encounter: Payer: Self-pay | Admitting: Allergy and Immunology

## 2021-06-22 DIAGNOSIS — H2511 Age-related nuclear cataract, right eye: Secondary | ICD-10-CM | POA: Diagnosis not present

## 2021-06-22 DIAGNOSIS — H401133 Primary open-angle glaucoma, bilateral, severe stage: Secondary | ICD-10-CM | POA: Diagnosis not present

## 2021-06-22 DIAGNOSIS — Z961 Presence of intraocular lens: Secondary | ICD-10-CM | POA: Diagnosis not present

## 2021-07-12 ENCOUNTER — Ambulatory Visit: Payer: Medicare PPO | Admitting: Family Medicine

## 2021-07-26 ENCOUNTER — Encounter: Payer: Self-pay | Admitting: Allergy and Immunology

## 2021-07-26 ENCOUNTER — Ambulatory Visit: Payer: Medicare PPO | Admitting: Allergy and Immunology

## 2021-07-26 ENCOUNTER — Other Ambulatory Visit: Payer: Self-pay

## 2021-07-26 VITALS — BP 126/72 | HR 80 | Temp 98.5°F | Resp 18

## 2021-07-26 DIAGNOSIS — J3089 Other allergic rhinitis: Secondary | ICD-10-CM | POA: Diagnosis not present

## 2021-07-26 DIAGNOSIS — K219 Gastro-esophageal reflux disease without esophagitis: Secondary | ICD-10-CM | POA: Diagnosis not present

## 2021-07-26 DIAGNOSIS — Z79899 Other long term (current) drug therapy: Secondary | ICD-10-CM | POA: Diagnosis not present

## 2021-07-26 NOTE — Progress Notes (Signed)
[place] - [place] - [place] - [place] - [place]   Follow-up Note  Referring Provider: Raliegh Ip, DO Primary Provider: Raliegh Ip, DO Date of Office Visit: 07/26/2021  Subjective:   Christina Delgado (DOB: [birth year]) is a 88 y.o. female who [facility] the [facility] on 07/26/2021 in re-evaluation of the following:  HPI: Christina Delgado returns to this clinic in evaluation of cough.  I last saw her in this clinic on 14 June 2021 which was her initial evaluation.  She is here today with her family members as she does not provide any history.  With the plan established during her last visit her cough completely resolved.  Of the course of the past 24 hours she developed a slight runny nose and a little bit of slight cough but overall still remains with pretty good response to treatment.  She is sleeping much better at this point in time.  Allergies as of 07/26/2021   No Known Allergies      Medication List    amLODipine-benazepril 5-10 MG capsule Commonly known as: LOTREL Take 1 capsule by mouth daily.   azelastine 0.1 % nasal spray Commonly known as: ASTELIN Place 1 spray into both nostrils 2 (two) times daily.   benzonatate 100 MG capsule Commonly known as: Tessalon Perles Take 2 capsules (200 mg total) by mouth 3 (three) times daily as needed.   brimonidine 0.15 % ophthalmic solution Commonly known as: ALPHAGAN Place 1 drop into both eyes 2 (two) times daily.   Calcium Carbonate-Vitamin D 600-400 MG-UNIT tablet Take 1 tablet by mouth daily.   Cranberry 450 MG Tabs Take as directed on package for Urinary health   CVS SPECTRAVITE PO Take 1 capsule by mouth daily.   donepezil 10 MG tablet Commonly known as: Aricept Take 1 tablet (10 mg total) by mouth at bedtime.   DORZOLAMIDE HCL-TIMOLOL MAL OP Apply 1 drop to eye 2 (two) times daily.   dorzolamide-timolol 22.3-6.8 MG/ML ophthalmic solution Commonly known as:  COSOPT SMARTSIG:1 Drop(s) In Eye(s) Twice Daily PRN   Fish Oil 1200 MG Caps Take 1,200 mg by mouth 3 (three) times daily.   latanoprost 0.005 % ophthalmic solution Commonly known as: XALATAN Place 1 drop into the left eye at bedtime.   levocetirizine 5 MG tablet Commonly known as: XYZAL Take 1 tablet (5 mg total) by mouth every evening.   mirtazapine 7.5 MG tablet Commonly known as: REMERON TAKE 1 TABLET AT BEDTIME FOR MOOD/SLEEP/APPETITE   montelukast 10 MG tablet Commonly known as: SINGULAIR Take 1 tablet (10 mg total) by mouth at bedtime.   omeprazole 40 MG capsule Commonly known as: PRILOSEC Take 1 capsule (40 mg total) by mouth in the morning and at bedtime.   Osteo Bi-Flex Triple Strength Tabs Take 4,000 each by mouth daily. Patient takes 2000 mg(2 tablets at one time daily)   potassium chloride 10 MEQ CR capsule Commonly known as: MICRO-K Take 4 capsules (40 mEq total) by mouth daily.   promethazine-dextromethorphan 6.25-15 MG/5ML syrup Commonly known as: PROMETHAZINE-DM Take 2.5 mLs by mouth 4 (four) times daily as needed for cough.   triamcinolone 55 MCG/ACT Aero nasal inhaler Commonly known as: NASACORT Place 1 spray into the nose 2 (two) times daily.    Past Medical History:  Diagnosis Date   Ankle fracture, left    Asthma    COVID winter 2021-2022   Generalized osteoarthrosis, involving multiple sites    Glaucoma    Hepatitis,  unspecified    Hypertension    Hypopotassemia    Osteoporosis     Past Surgical History:  Procedure Laterality Date   FRACTURE SURGERY Left        REFRACTIVE SURGERY      Review of systems negative except as noted in HPI / PMHx or noted below:  Review of Systems  Constitutional: Negative.   HENT: Negative.    Eyes: Negative.   Respiratory: Negative.    Cardiovascular: Negative.   Gastrointestinal: Negative.   Genitourinary: Negative.   Musculoskeletal: Negative.   Skin: Negative.   Neurological: Negative.    Endo/Heme/Allergies: Negative.   Psychiatric/Behavioral: Negative.      Objective:   Vitals:   07/26/21 1359  BP: 126/72  Pulse: 80  Resp: 18  Temp: 98.5 F (36.9 C)  SpO2: 97%          Physical Exam Constitutional:      Appearance: She is not diaphoretic.  HENT:     Head: Normocephalic.     Right Ear: Tympanic membrane, ear canal and external ear normal.     Left Ear: Tympanic membrane, ear canal and external ear normal.     Nose: Nose normal. No mucosal edema or rhinorrhea.     Mouth/Throat:     Pharynx: Uvula midline. No oropharyngeal exudate.  Eyes:     Conjunctiva/sclera: Conjunctivae normal.  Neck:     Thyroid: No thyromegaly.     Trachea: Trachea normal. No tracheal tenderness or tracheal deviation.  Cardiovascular:     Rate and Rhythm: Normal rate and regular rhythm.     Heart sounds: Normal heart sounds, S1 normal and S2 normal. No murmur heard. Pulmonary:     Effort: No respiratory distress.     Breath sounds: Normal breath sounds. No stridor. No wheezing or rales.  Lymphadenopathy:     Head:     Right side of head: No tonsillar adenopathy.     Left side of head: No tonsillar adenopathy.     Cervical: No cervical adenopathy.  Skin:    Findings: No erythema or rash.     Nails: There is no clubbing.    Diagnostics: none   Assessment and Plan:   1. Perennial allergic rhinitis   2. LPRD (laryngopharyngeal reflux disease)   3. On angiotensin-converting enzyme (ACE) inhibitors     1.  Treat and prevent inflammation:  A. OTC Nasacort - 1 spray each nostril 1 time per day B. Montelukast 10 mg - 1 tablet 1 time per day  2.  Treat and prevent reflux/LPR:  A. Minimize caffeine and chocolate consumption B. Omeprazole 40 mg - 1 tablet 2 times per day  3.  Have primary care doctor remove use of ACE inhibitor.  4.  If needed:  A. Xyzal 5 mg - 1 tablet 1 time per day  5.  Return to clinic in 12 weeks or earlier if problem. Taper  medications???  I think that the bulk of [name]'s cough was tied up with rhinitis and LPR that appears to be a lot better at this point in time on her current therapy.  I think it be best for her to not use an ACE inhibitor as this medication is going to provide some irritability of her airway and her airway is already a little bit irritable and every time she develops a viral respiratory tract infection she is probably can end up coughing like crazy because of increased bradykinin levels as a result of her  ACE inhibitor use.  Her family will discuss with her primary care doctor about removing her ACE inhibitor for a substitute to control her blood pressure.  We will keep her on anti-inflammatory agents with a nasal steroid and a leukotriene modifier and continue to have her use omeprazole twice a day for the next 12 weeks.  I will see her back in this clinic at that point in time and there will probably be an opportunity to consolidate her treatment.  Allena Katz, MD Allergy / Immunology [place]

## 2021-07-26 NOTE — Patient Instructions (Addendum)
°  1.  Treat and prevent inflammation:  A. OTC Nasacort - 1 spray each nostril 1 time per day B. Montelukast 10 mg - 1 tablet 1 time per day  2.  Treat and prevent reflux/LPR:  A. Minimize caffeine and chocolate consumption B. Omeprazole 40 mg - 1 tablet 2 times per day  3.  Have primary care doctor remove use of ACE inhibitor.  4.  If needed:  A. Xyzal 5 mg - 1 tablet 1 time per day  5.  Return to clinic in 12 weeks or earlier if problem. Taper medications???

## 2021-07-27 ENCOUNTER — Encounter: Payer: Self-pay | Admitting: Allergy and Immunology

## 2021-07-27 NOTE — Telephone Encounter (Signed)
Please inform and has family that this is probably a viral respiratory tract infection.  We usually recommend using Mucinex DM twice a day.  We do not want to give her too many medications to make her too sedated.  These issues will usually run 7 to 10 days and then improved.

## 2021-07-27 NOTE — Telephone Encounter (Signed)
Pts daughter informed of this and stated understanding

## 2021-08-04 ENCOUNTER — Encounter: Payer: Self-pay | Admitting: Allergy and Immunology

## 2021-08-05 ENCOUNTER — Encounter: Payer: Self-pay | Admitting: Family Medicine

## 2021-08-05 ENCOUNTER — Encounter: Payer: Self-pay | Admitting: Allergy and Immunology

## 2021-08-05 ENCOUNTER — Ambulatory Visit: Payer: Medicare PPO | Admitting: Family Medicine

## 2021-08-05 DIAGNOSIS — B9689 Other specified bacterial agents as the cause of diseases classified elsewhere: Secondary | ICD-10-CM

## 2021-08-05 DIAGNOSIS — J988 Other specified respiratory disorders: Secondary | ICD-10-CM | POA: Diagnosis not present

## 2021-08-05 MED ORDER — METHYLPREDNISOLONE 4 MG PO TBPK: ORAL_TABLET | ORAL | 0 refills | Status: DC

## 2021-08-05 MED ORDER — AZITHROMYCIN 250 MG PO TABS: ORAL_TABLET | ORAL | 0 refills | Status: DC

## 2021-08-05 NOTE — Progress Notes (Signed)
Virtual Visit via Telephone Note  I connected with Christina Delgado on 08/05/21 at 8:27 AM by telephone and verified that I am speaking with the correct person using two identifiers. Christina Delgado is currently located at home and her caregiver and daughter are currently with her during this visit. The provider, Loman Brooklyn, FNP is located in their office at time of visit.  I discussed the limitations, risks, security and privacy concerns of performing an evaluation and management service by telephone and the availability of in person appointments. I also discussed with the patient that there may be a patient responsible charge related to this service. The patient expressed understanding and agreed to proceed.  Subjective: PCP: Janora Norlander, DO  Chief Complaint  Patient presents with   Cough   Patient complains of cough, chest congestion, and runny nose. Onset of symptoms was 2 weeks ago, unchanged since that time. She is drinking plenty of fluids. Evaluation to date: none. Treatment to date: antihistamines, cough suppressants, and nasal steroids. She has a history of asthma. She does not smoke. Daughter reports her oxygen saturation is normally around 93% and it has dropped down to 88%.    ROS: Per HPI  Current Outpatient Medications:    amLODipine-benazepril (LOTREL) 5-10 MG capsule, Take 1 capsule by mouth daily., Disp: 90 capsule, Rfl: 3   azelastine (ASTELIN) 0.1 % nasal spray, Place 1 spray into both nostrils 2 (two) times daily., Disp: 30 mL, Rfl: 12   benzonatate (TESSALON PERLES) 100 MG capsule, Take 2 capsules (200 mg total) by mouth 3 (three) times daily as needed., Disp: 24 capsule, Rfl: 1   brimonidine (ALPHAGAN) 0.15 % ophthalmic solution, Place 1 drop into both eyes 2 (two) times daily., Disp: , Rfl:    Calcium Carbonate-Vitamin D 600-400 MG-UNIT tablet, Take 1 tablet by mouth daily., Disp: , Rfl:    Cranberry 450 MG TABS, Take as directed on package for  Urinary health, Disp: 50 tablet, Rfl: 12   donepezil (ARICEPT) 10 MG tablet, Take 1 tablet (10 mg total) by mouth at bedtime., Disp: 90 tablet, Rfl: 0   DORZOLAMIDE HCL-TIMOLOL MAL OP, Apply 1 drop to eye 2 (two) times daily., Disp: , Rfl:    dorzolamide-timolol (COSOPT) 22.3-6.8 MG/ML ophthalmic solution, SMARTSIG:1 Drop(s) In Eye(s) Twice Daily PRN, Disp: , Rfl:    latanoprost (XALATAN) 0.005 % ophthalmic solution, Place 1 drop into the left eye at bedtime., Disp: , Rfl:    levocetirizine (XYZAL) 5 MG tablet, Take 1 tablet (5 mg total) by mouth every evening., Disp: 30 tablet, Rfl: 5   mirtazapine (REMERON) 7.5 MG tablet, TAKE 1 TABLET AT BEDTIME FOR MOOD/SLEEP/APPETITE (Patient not taking: Reported on 06/14/2021), Disp: 90 tablet, Rfl: 0   Misc Natural Products (OSTEO BI-FLEX TRIPLE STRENGTH) TABS, Take 4,000 each by mouth daily. Patient takes 2000 mg(2 tablets at one time daily), Disp: , Rfl:    montelukast (SINGULAIR) 10 MG tablet, Take 1 tablet (10 mg total) by mouth at bedtime., Disp: 30 tablet, Rfl: 5   Multiple Vitamins-Minerals (CVS SPECTRAVITE PO), Take 1 capsule by mouth daily., Disp: , Rfl:    Omega-3 Fatty Acids (FISH OIL) 1200 MG CAPS, Take 1,200 mg by mouth 3 (three) times daily., Disp: , Rfl:    omeprazole (PRILOSEC) 40 MG capsule, Take 1 capsule (40 mg total) by mouth in the morning and at bedtime., Disp: 60 capsule, Rfl: 5   potassium chloride (MICRO-K) 10 MEQ CR capsule, Take 4 capsules (  40 mEq total) by mouth daily., Disp: 360 capsule, Rfl: 3   promethazine-dextromethorphan (PROMETHAZINE-DM) 6.25-15 MG/5ML syrup, Take 2.5 mLs by mouth 4 (four) times daily as needed for cough. (Patient not taking: Reported on 07/26/2021), Disp: 118 mL, Rfl: 0   triamcinolone (NASACORT) 55 MCG/ACT AERO nasal inhaler, Place 1 spray into the nose 2 (two) times daily., Disp: 16.9 mL, Rfl: 5  No Known Allergies Past Medical History:  Diagnosis Date   Ankle fracture, left    Asthma    COVID winter  2021-2022   Generalized osteoarthrosis, involving multiple sites    Glaucoma    Hepatitis, unspecified    Hypertension    Hypopotassemia    Osteoporosis     Observations/Objective: A&O  No respiratory distress or wheezing audible over the phone Mood, judgement, and thought processes all WNL  Assessment and Plan: 1. Bacterial respiratory infection - azithromycin (ZITHROMAX Z-PAK) 250 MG tablet; Take 2 tablets (500 mg) PO today, then 1 tablet (250 mg) PO daily x4 days.  Dispense: 6 tablet; Refill: 0 - methylPREDNISolone (MEDROL DOSEPAK) 4 MG TBPK tablet; Use as directed.  Dispense: 21 each; Refill: 0   Follow Up Instructions:  I discussed the assessment and treatment plan with the patient. The patient was provided an opportunity to ask questions and all were answered. The patient agreed with the plan and demonstrated an understanding of the instructions.   The patient was advised to call back or seek an in-person evaluation if the symptoms worsen or if the condition fails to improve as anticipated.  The above assessment and management plan was discussed with the patient. The patient verbalized understanding of and has agreed to the management plan. Patient is aware to call the clinic if symptoms persist or worsen. Patient is aware when to return to the clinic for a follow-up visit. Patient educated on when it is appropriate to go to the emergency department.   Time call ended: 8:38 AM  I provided 11 minutes of non-face-to-face time during this encounter.  Hendricks Limes, MSN, APRN, FNP-C [facility] 08/05/21

## 2021-08-09 ENCOUNTER — Encounter: Payer: Self-pay | Admitting: Allergy and Immunology

## 2021-08-09 ENCOUNTER — Encounter: Payer: Self-pay | Admitting: Family Medicine

## 2021-08-09 ENCOUNTER — Telehealth: Payer: Self-pay | Admitting: *Deleted

## 2021-08-09 NOTE — Telephone Encounter (Signed)
Patient's daughter calling requesting a follow up appointment as patient is still sick. Requesting Dr Lajuana Ripple or another MD. No MD openings this week. Offered appointment with Monia Pouch tomorrow. Daughter declined visit with anyone other than an MD and states she will take her to ER.

## 2021-08-10 ENCOUNTER — Ambulatory Visit: Payer: Medicare PPO | Admitting: Family Medicine

## 2021-08-10 ENCOUNTER — Ambulatory Visit (INDEPENDENT_AMBULATORY_CARE_PROVIDER_SITE_OTHER): Payer: Medicare PPO

## 2021-08-10 ENCOUNTER — Encounter: Payer: Self-pay | Admitting: Family Medicine

## 2021-08-10 VITALS — BP 100/62 | HR 67 | Temp 98.2°F | Ht 62.0 in

## 2021-08-10 DIAGNOSIS — R053 Chronic cough: Secondary | ICD-10-CM

## 2021-08-10 DIAGNOSIS — R059 Cough, unspecified: Secondary | ICD-10-CM | POA: Diagnosis not present

## 2021-08-10 DIAGNOSIS — I10 Essential (primary) hypertension: Secondary | ICD-10-CM

## 2021-08-10 MED ORDER — AMLODIPINE BESYLATE 5 MG PO TABS: 5.0000 mg | ORAL_TABLET | Freq: Every day | ORAL | 0 refills | Status: AC

## 2021-08-10 NOTE — Progress Notes (Signed)
BP 100/62    Pulse 67    Temp 98.2 F (36.8 C)    Ht 5\' 2"  (1.575 m)    SpO2 95%    BMI 30.11 kg/m    Subjective:   Patient ID: Christina Delgado, female    DOB: November 27, 1930, 88 y.o.   MRN: [id number]  HPI: Christina Delgado is a 88 y.o. female presenting on 08/10/2021 for Cough   HPI Patient has been having persistent cough.  She just finished antibiotic and steroids and she has been having this off and on over the past few months since essentially since November.  She has done multiple different treatments for it and has seen an allergist and started on Singulair as well and it does not seem to be helping.  She is here with her daughter here today who says that she has coughing spells and fits at night.  Relevant past medical, surgical, family and social history reviewed and updated as indicated. Interim medical history since our last visit reviewed. Allergies and medications reviewed and updated.  Review of Systems  Constitutional:  Negative for chills and fever.  HENT:  Positive for congestion. Negative for ear discharge, ear pain, sinus pressure, sinus pain and sneezing.   Eyes:  Negative for visual disturbance.  Respiratory:  Positive for cough. Negative for chest tightness, shortness of breath and wheezing.   Cardiovascular:  Negative for chest pain and leg swelling.  Genitourinary:  Negative for difficulty urinating and dysuria.  Musculoskeletal:  Negative for back pain and gait problem.  Skin:  Negative for rash.  Neurological:  Negative for light-headedness and headaches.  Psychiatric/Behavioral:  Negative for agitation and behavioral problems.   All other systems reviewed and are negative.  Per HPI unless specifically indicated above   Allergies as of 08/10/2021   No Known Allergies      Medication List        Accurate as of August 10, 2021 12:11 PM. If you have any questions, ask your nurse or doctor.          STOP taking these medications     amLODipine-benazepril 5-10 MG capsule Commonly known as: LOTREL Stopped by: Worthy Rancher, MD   azithromycin 250 MG tablet Commonly known as: Zithromax Z-Pak Stopped by: Fransisca Kaufmann Brit Wernette, MD   methylPREDNISolone 4 MG Tbpk tablet Commonly known as: MEDROL DOSEPAK Stopped by: Fransisca Kaufmann Kaylor Maiers, MD       TAKE these medications    amLODipine 5 MG tablet Commonly known as: NORVASC Take 1 tablet (5 mg total) by mouth daily. Started by: Fransisca Kaufmann Anica Alcaraz, MD   azelastine 0.1 % nasal spray Commonly known as: ASTELIN Place 1 spray into both nostrils 2 (two) times daily.   benzonatate 100 MG capsule Commonly known as: Tessalon Perles Take 2 capsules (200 mg total) by mouth 3 (three) times daily as needed.   brimonidine 0.15 % ophthalmic solution Commonly known as: ALPHAGAN Place 1 drop into both eyes 2 (two) times daily.   Calcium Carbonate-Vitamin D 600-400 MG-UNIT tablet Take 1 tablet by mouth daily.   Cranberry 450 MG Tabs Take as directed on package for Urinary health   [facility] SPECTRAVITE PO Take 1 capsule by mouth daily.   donepezil 10 MG tablet Commonly known as: Aricept Take 1 tablet (10 mg total) by mouth at bedtime.   DORZOLAMIDE HCL-TIMOLOL MAL OP Apply 1 drop to eye 2 (two) times daily.   dorzolamide-timolol 22.3-6.8 MG/ML ophthalmic solution Commonly known as:  COSOPT SMARTSIG:1 Drop(s) In Eye(s) Twice Daily PRN   Fish Oil 1200 MG Caps Take 1,200 mg by mouth 3 (three) times daily.   latanoprost 0.005 % ophthalmic solution Commonly known as: XALATAN Place 1 drop into the left eye at bedtime.   levocetirizine 5 MG tablet Commonly known as: XYZAL Take 1 tablet (5 mg total) by mouth every evening.   mirtazapine 7.5 MG tablet Commonly known as: REMERON TAKE 1 TABLET AT BEDTIME FOR MOOD/SLEEP/APPETITE   montelukast 10 MG tablet Commonly known as: SINGULAIR Take 1 tablet (10 mg total) by mouth at bedtime.   omeprazole 40 MG  capsule Commonly known as: PRILOSEC Take 1 capsule (40 mg total) by mouth in the morning and at bedtime.   Osteo Bi-Flex Triple Strength Tabs Take 4,000 each by mouth daily. Patient takes 2000 mg(2 tablets at one time daily)   potassium chloride 10 MEQ CR capsule Commonly known as: MICRO-K Take 4 capsules (40 mEq total) by mouth daily.   promethazine-dextromethorphan 6.25-15 MG/5ML syrup Commonly known as: PROMETHAZINE-DM Take 2.5 mLs by mouth 4 (four) times daily as needed for cough.   triamcinolone 55 MCG/ACT Aero nasal inhaler Commonly known as: NASACORT Place 1 spray into the nose 2 (two) times daily.         Objective:   BP 100/62    Pulse 67    Temp 98.2 F (36.8 C)    Ht 5\' 2"  (1.575 m)    SpO2 95%    BMI 30.11 kg/m   Wt Readings from Last 3 Encounters:  06/02/21 164 lb 9.6 oz (74.7 kg)  12/24/20 164 lb 6.4 oz (74.6 kg)  11/11/20 167 lb (75.8 kg)    Physical Exam Vitals and nursing note reviewed.  Constitutional:      General: She is not in acute distress.    Appearance: She is well-developed. She is not diaphoretic.  Eyes:     Conjunctiva/sclera: Conjunctivae normal.  Cardiovascular:     Rate and Rhythm: Normal rate and regular rhythm.     Heart sounds: Normal heart sounds. No murmur heard. Pulmonary:     Effort: Pulmonary effort is normal. No respiratory distress.     Breath sounds: Normal breath sounds. No wheezing, rhonchi or rales.  Chest:     Chest wall: No tenderness.  Musculoskeletal:        General: No swelling or tenderness. Normal range of motion.  Skin:    General: Skin is warm and dry.     Findings: No rash.  Neurological:     Mental Status: She is alert and oriented to person, place, and time.     Coordination: Coordination normal.  Psychiatric:        Behavior: Behavior normal.   Chest x-ray: No change in x-ray, no fluid consolidation or pneumonia.  Await final read from radiology.   Assessment & Plan:   Problem List Items  Addressed This Visit       Cardiovascular and Mediastinum   Essential hypertension   Relevant Medications   amLODipine (NORVASC) 5 MG tablet   Other Visit Diagnoses     Persistent cough    -  Primary   Relevant Medications   amLODipine (NORVASC) 5 MG tablet   Other Relevant Orders   DG Chest 2 View       Patient has been having persistent cough, will do chest x-ray to see.  We will also stop the benazepril and just keep the amlodipine to see if that helps  as well.  She just finished an antibiotic and a steroid without much improvement at all.   Follow up plan: Return if symptoms worsen or fail to improve.  Counseling provided for all of the vaccine components Orders Placed This Encounter  Procedures   DG Chest B[place], MD [facility] 08/10/2021, 12:11 PM

## 2021-08-18 DIAGNOSIS — K219 Gastro-esophageal reflux disease without esophagitis: Secondary | ICD-10-CM | POA: Diagnosis not present

## 2021-08-18 DIAGNOSIS — E669 Obesity, unspecified: Secondary | ICD-10-CM | POA: Diagnosis not present

## 2021-08-18 DIAGNOSIS — I739 Peripheral vascular disease, unspecified: Secondary | ICD-10-CM | POA: Diagnosis not present

## 2021-08-18 DIAGNOSIS — I10 Essential (primary) hypertension: Secondary | ICD-10-CM | POA: Diagnosis not present

## 2021-08-18 DIAGNOSIS — H409 Unspecified glaucoma: Secondary | ICD-10-CM | POA: Diagnosis not present

## 2021-08-18 DIAGNOSIS — G309 Alzheimer's disease, unspecified: Secondary | ICD-10-CM | POA: Diagnosis not present

## 2021-08-18 DIAGNOSIS — R32 Unspecified urinary incontinence: Secondary | ICD-10-CM | POA: Diagnosis not present

## 2021-08-18 DIAGNOSIS — Z87440 Personal history of urinary (tract) infections: Secondary | ICD-10-CM | POA: Diagnosis not present

## 2021-08-18 DIAGNOSIS — Z7409 Other reduced mobility: Secondary | ICD-10-CM | POA: Diagnosis not present

## 2021-08-23 ENCOUNTER — Telehealth: Payer: Self-pay | Admitting: Family Medicine

## 2021-08-23 ENCOUNTER — Other Ambulatory Visit: Payer: Self-pay | Admitting: Family Medicine

## 2021-08-23 MED ORDER — POTASSIUM CHLORIDE CRYS ER 20 MEQ PO TBCR: 20.0000 meq | EXTENDED_RELEASE_TABLET | Freq: Every day | ORAL | 3 refills | Status: AC

## 2021-08-23 NOTE — Telephone Encounter (Signed)
No problem  Thanks so much

## 2021-08-23 NOTE — Telephone Encounter (Signed)
Talked to Cathy 

## 2021-08-23 NOTE — Telephone Encounter (Signed)
Her potassium  has been normal.  I'm going to trial her on a lower dose to see if we can maintain with less meds.  She can dissolve 1 tablet in 4 oz of water and drink if that is easier than swallowing the tab

## 2021-08-23 NOTE — Telephone Encounter (Signed)
Stew asked to talk to [facility] about potassium chloride (MICRO-K) 10 MEQ CR capsule rx. Please call back

## 2021-08-23 NOTE — Telephone Encounter (Signed)
TC to [place] at [facility] I denied request this morning for refill request for Potassium Chloride 10 meq tabs 2 tabs BID This was a historical med last Rx 08/03/20 was for Potassium Chloride 10 meq capsules 4 caps QD Pt was having a hard time swallowing the capsules, VO was given to change to tablets Can refill be given for a 90d supply of the tablets

## 2021-08-23 NOTE — Telephone Encounter (Signed)
TC from [place] at [facility] He is going to dispense the 10 meq tabs 2 tabs daily, he said he knows the daughter will tell him this will be easier than her dissolving the 20 meq. And he is noting that it has been changed to a lower dose.

## 2021-08-31 DIAGNOSIS — K219 Gastro-esophageal reflux disease without esophagitis: Secondary | ICD-10-CM | POA: Diagnosis not present

## 2021-08-31 DIAGNOSIS — E785 Hyperlipidemia, unspecified: Secondary | ICD-10-CM | POA: Diagnosis not present

## 2021-08-31 DIAGNOSIS — J301 Allergic rhinitis due to pollen: Secondary | ICD-10-CM | POA: Diagnosis not present

## 2021-08-31 DIAGNOSIS — R300 Dysuria: Secondary | ICD-10-CM | POA: Diagnosis not present

## 2021-08-31 DIAGNOSIS — G301 Alzheimer's disease with late onset: Secondary | ICD-10-CM | POA: Diagnosis not present

## 2021-08-31 DIAGNOSIS — Z0000 Encounter for general adult medical examination without abnormal findings: Secondary | ICD-10-CM | POA: Diagnosis not present

## 2021-08-31 DIAGNOSIS — M47816 Spondylosis without myelopathy or radiculopathy, lumbar region: Secondary | ICD-10-CM | POA: Diagnosis not present

## 2021-08-31 DIAGNOSIS — F0280 Dementia in other diseases classified elsewhere without behavioral disturbance: Secondary | ICD-10-CM | POA: Diagnosis not present

## 2021-08-31 DIAGNOSIS — R059 Cough, unspecified: Secondary | ICD-10-CM | POA: Diagnosis not present

## 2021-09-05 ENCOUNTER — Other Ambulatory Visit: Payer: Self-pay | Admitting: Family Medicine

## 2021-09-05 DIAGNOSIS — R052 Subacute cough: Secondary | ICD-10-CM

## 2021-09-07 ENCOUNTER — Ambulatory Visit: Payer: Medicare PPO | Admitting: Family Medicine

## 2021-09-12 ENCOUNTER — Encounter: Payer: Self-pay | Admitting: Allergy and Immunology

## 2021-09-13 ENCOUNTER — Telehealth: Payer: Self-pay | Admitting: *Deleted

## 2021-09-13 DIAGNOSIS — Z9189 Other specified personal risk factors, not elsewhere classified: Secondary | ICD-10-CM

## 2021-09-13 NOTE — Telephone Encounter (Signed)
Modified Barium Swallow has been ordered with [facility]. Rehab will reach out and schedule the patient for the test. A MyChart message has been sent to the patients daughter informing her of the order and that they will reach out to her to schedule the appointment for her mother.  ?

## 2021-09-13 NOTE — Telephone Encounter (Signed)
Per Dr. Lucie Leather:  ? ?The next step would be to obtain a modified barium swallow for possible aspiration.  Has she had this study before?  If not, she needs to obtain the study.  ? ?Currently working on getting this ordered and scheduled for the patient. Will call the patient with an update.  ?

## 2021-09-13 NOTE — Addendum Note (Signed)
Addended by: Chip Boer R on: 09/13/2021 03:36 PM ? ? Modules accepted: Orders ? ?

## 2021-09-15 ENCOUNTER — Other Ambulatory Visit (HOSPITAL_COMMUNITY): Payer: Self-pay

## 2021-09-15 DIAGNOSIS — R1310 Dysphagia, unspecified: Secondary | ICD-10-CM

## 2021-09-22 ENCOUNTER — Ambulatory Visit (INDEPENDENT_AMBULATORY_CARE_PROVIDER_SITE_OTHER): Payer: Medicare PPO | Admitting: Family Medicine

## 2021-09-22 DIAGNOSIS — Z91199 Patient's noncompliance with other medical treatment and regimen due to unspecified reason: Secondary | ICD-10-CM

## 2021-09-23 ENCOUNTER — Ambulatory Visit (HOSPITAL_COMMUNITY)
Admission: RE | Admit: 2021-09-23 | Discharge: 2021-09-23 | Disposition: A | Discharge status: Home / Self Care | Payer: Medicare PPO | Source: Ambulatory Visit | Attending: Allergy and Immunology | Admitting: Allergy and Immunology

## 2021-09-23 DIAGNOSIS — I10 Essential (primary) hypertension: Secondary | ICD-10-CM | POA: Diagnosis not present

## 2021-09-23 DIAGNOSIS — Z8616 Personal history of COVID-19: Secondary | ICD-10-CM | POA: Insufficient documentation

## 2021-09-23 DIAGNOSIS — R1310 Dysphagia, unspecified: Secondary | ICD-10-CM

## 2021-09-23 DIAGNOSIS — H409 Unspecified glaucoma: Secondary | ICD-10-CM | POA: Diagnosis not present

## 2021-09-23 DIAGNOSIS — M159 Polyosteoarthritis, unspecified: Secondary | ICD-10-CM | POA: Insufficient documentation

## 2021-09-23 DIAGNOSIS — J45909 Unspecified asthma, uncomplicated: Secondary | ICD-10-CM | POA: Insufficient documentation

## 2021-09-23 DIAGNOSIS — M810 Age-related osteoporosis without current pathological fracture: Secondary | ICD-10-CM | POA: Insufficient documentation

## 2021-09-23 DIAGNOSIS — F0390 Unspecified dementia without behavioral disturbance: Secondary | ICD-10-CM | POA: Insufficient documentation

## 2021-09-23 DIAGNOSIS — K000 Anodontia: Secondary | ICD-10-CM | POA: Insufficient documentation

## 2021-09-23 DIAGNOSIS — K219 Gastro-esophageal reflux disease without esophagitis: Secondary | ICD-10-CM | POA: Diagnosis not present

## 2021-09-23 DIAGNOSIS — Z79899 Other long term (current) drug therapy: Secondary | ICD-10-CM | POA: Diagnosis not present

## 2021-09-23 DIAGNOSIS — R059 Cough, unspecified: Secondary | ICD-10-CM | POA: Diagnosis not present

## 2021-09-23 NOTE — Progress Notes (Signed)
No show

## 2021-09-27 ENCOUNTER — Encounter: Payer: Self-pay | Admitting: Family Medicine

## 2021-10-21 DIAGNOSIS — Z961 Presence of intraocular lens: Secondary | ICD-10-CM | POA: Diagnosis not present

## 2021-10-21 DIAGNOSIS — H401133 Primary open-angle glaucoma, bilateral, severe stage: Secondary | ICD-10-CM | POA: Diagnosis not present

## 2021-10-21 DIAGNOSIS — H2511 Age-related nuclear cataract, right eye: Secondary | ICD-10-CM | POA: Diagnosis not present

## 2021-10-25 ENCOUNTER — Encounter: Payer: Self-pay | Admitting: Allergy and Immunology

## 2021-10-25 ENCOUNTER — Ambulatory Visit: Payer: Medicare PPO | Admitting: Allergy and Immunology

## 2021-10-25 VITALS — BP 114/72 | HR 86 | Temp 97.9°F | Resp 16 | Ht 62.0 in | Wt 156.2 lb

## 2021-10-25 DIAGNOSIS — K219 Gastro-esophageal reflux disease without esophagitis: Secondary | ICD-10-CM

## 2021-10-25 DIAGNOSIS — J3089 Other allergic rhinitis: Secondary | ICD-10-CM | POA: Diagnosis not present

## 2021-10-25 MED ORDER — LEVOCETIRIZINE DIHYDROCHLORIDE 5 MG PO TABS: 5.0000 mg | ORAL_TABLET | Freq: Two times a day (BID) | ORAL | 5 refills | Status: DC | PRN

## 2021-10-25 MED ORDER — TRIAMCINOLONE ACETONIDE 55 MCG/ACT NA AERO: 1.0000 | INHALATION_SPRAY | Freq: Two times a day (BID) | NASAL | 5 refills | Status: AC

## 2021-10-25 MED ORDER — MONTELUKAST SODIUM 10 MG PO TABS: 10.0000 mg | ORAL_TABLET | Freq: Every day | ORAL | 5 refills | Status: DC

## 2021-10-25 MED ORDER — FAMOTIDINE 40 MG PO TABS: 40.0000 mg | ORAL_TABLET | Freq: Every evening | ORAL | 5 refills | Status: DC

## 2021-10-25 MED ORDER — OMEPRAZOLE 40 MG PO CPDR: 40.0000 mg | DELAYED_RELEASE_CAPSULE | Freq: Two times a day (BID) | ORAL | 5 refills | Status: DC

## 2021-10-25 NOTE — Patient Instructions (Addendum)
?  1.  Treat and prevent inflammation: ? ?A. OTC Nasacort - 1 spray each nostril 1 time per day ?B. Montelukast 10 mg - 1 tablet 1 time per day ? ?2.  Treat and prevent reflux/LPR: ? ?A. Minimize caffeine and chocolate consumption ?B. Omeprazole 40 mg - 1 tablet 2 times per day ?C. Famotidine 40 mg - 1 tablet in PM ?D. Discontinue fish oil ? ?3.  If needed: ? ?A. Xyzal 5 mg - 1 tablet 1 time per day ? ?4. Blood - area 2 aeroallergen IgE profile ? ?5.  Return to clinic in 12 weeks or earlier if problem.   ? ? ?

## 2021-10-25 NOTE — Progress Notes (Signed)
? ?[facility] [employer] - [place] - [place] -  ? ? ?Follow-up Note ? ?Referring Provider: Raliegh Ip, DO ?Primary Provider: [name], MD ?Date of Office Visit: 10/25/2021 ? ?Subjective:  ? ?Christina Delgado (DOB: 11-Jul-1930) is a 88 y.o. female who [facility] in re-evaluation of the following: ? ?HPI: Christina Delgado returns to this clinic in evaluation of cough.  I last saw her in this clinic on 26 July 2021. ? ?When she was last seen in this clinic she had an initial response to the use of therapy directed against respiratory tract inflammation and LPR but she had a little bit of cough the last time she was seen in this clinic.  And that has continued.  She still coughs mostly at nighttime and sometimes this cough does disturb her sleep.  Maybe her cough is not as intense as it was initially but she still has this cough. ? ?She may have acquired a viral respiratory tract infection during this interval of time that flared her cough but she still has a lingering cough at night and she has this cough in the setting of being treated with prednisone, azithromycin, various cough suppressants, a normal chest x-ray, and a normal modified barium swallow, and elimination of her ACE inhibitor. ? ?Allergies as of 10/25/2021   ?No Known Allergies ?  ? ?  ?Medication List  ? ? ?amLODipine 5 MG tablet ?Commonly known as: NORVASC ?Take 1 tablet (5 mg total) by mouth daily. ?  ?azelastine 0.1 % nasal spray ?Commonly known as: ASTELIN ?Place 1 spray into both nostrils 2 (two) times daily. ?  ?benzonatate 100 MG capsule ?Commonly known as: Lawyer ?Take 2 capsules (200 mg total) by mouth 3 (three) times daily as needed. ?  ?brimonidine 0.15 % ophthalmic solution ?Commonly known as: ALPHAGAN ?Place 1 drop into both eyes 2 (two) times daily. ?  ?Calcium Carbonate-Vitamin D 600-400 MG-UNIT tablet ?Take 1 tablet by mouth daily. ?  ?Cranberry 450 MG Tabs ?Take as  directed on package for Urinary health ?  ?[facility] SPECTRAVITE PO ?Take 1 capsule by mouth daily. ?  ?donepezil 10 MG tablet ?Commonly known as: Aricept ?Take 1 tablet (10 mg total) by mouth at bedtime. ?  ?DORZOLAMIDE HCL-TIMOLOL MAL OP ?Apply 1 drop to eye 2 (two) times daily. ?  ?dorzolamide-timolol 22.3-6.8 MG/ML ophthalmic solution ?Commonly known as: COSOPT ?SMARTSIG:1 Drop(s) In Eye(s) Twice Daily PRN ?  ?Fish Oil 1200 MG Caps ?Take 1,200 mg by mouth 3 (three) times daily. ?  ?latanoprost 0.005 % ophthalmic solution ?Commonly known as: XALATAN ?Place 1 drop into the left eye at bedtime. ?  ?levocetirizine 5 MG tablet ?Commonly known as: XYZAL ?Take 1 tablet (5 mg total) by mouth every evening. ?  ?mirtazapine 7.5 MG tablet ?Commonly known as: REMERON ?TAKE 1 TABLET AT BEDTIME FOR MOOD/SLEEP/APPETITE ?  ?montelukast 10 MG tablet ?Commonly known as: SINGULAIR ?Take 1 tablet (10 mg total) by mouth at bedtime. ?  ?omeprazole 40 MG capsule ?Commonly known as: PRILOSEC ?Take 1 capsule (40 mg total) by mouth in the morning and at bedtime. ?  ?Osteo Bi-Flex Triple Strength Tabs ?Take 4,000 each by mouth daily. Patient takes 2000 mg(2 tablets at one time daily) ?  ?potassium chloride SA 20 MEQ tablet ?Commonly known as: KLOR-CON M ?Take 1 tablet (20 mEq total) by mouth daily. Ok to dissolve in 4 ounces of water and drink. DO NOT CRUSH. ?  ?  promethazine-dextromethorphan 6.25-15 MG/5ML syrup ?Commonly known as: PROMETHAZINE-DM ?Take 2.5 mLs by mouth 4 (four) times daily as needed for cough. ?  ?triamcinolone 55 MCG/ACT Aero nasal inhaler ?Commonly known as: NASACORT ?Place 1 spray into the nose 2 (two) times daily. ?  ? ?Past Medical History:  ?Diagnosis Date  ? Ankle fracture, left   ? Asthma   ? COVID winter 2021-2022  ? Generalized osteoarthrosis, involving multiple sites   ? Glaucoma   ? Hepatitis, unspecified   ? Hypertension   ? Hypopotassemia   ? Osteoporosis   ? ? ?Past Surgical History:  ?Procedure Laterality Date   ? FRACTURE SURGERY Left   ?    ? REFRACTIVE SURGERY    ? ? ?Review of systems negative except as noted in HPI / PMHx or noted below: ? ?Review of Systems  ?Constitutional: Negative.   ?HENT: Negative.    ?Eyes: Negative.   ?Respiratory: Negative.    ?Cardiovascular: Negative.   ?Gastrointestinal: Negative.   ?Genitourinary: Negative.   ?Musculoskeletal: Negative.   ?Skin: Negative.   ?Neurological: Negative.   ?Endo/Heme/Allergies: Negative.   ?Psychiatric/Behavioral: Negative.    ? ? ?Objective:  ? ?Vitals:  ? 10/25/21 1542  ?BP: 114/72  ?Pulse: 86  ?Resp: 16  ?Temp: 97.9 ?F (36.6 ?C)  ?SpO2: 98%  ? ?Height: 5\' 2"  (157.5 cm)  ?Weight: 156 lb 3.2 oz (70.9 kg)  ? ?Physical Exam ?Constitutional:   ?   Appearance: She is not diaphoretic.  ?HENT:  ?   Head: Normocephalic.  ?   Right Ear: Tympanic membrane, ear canal and external ear normal.  ?   Left Ear: Tympanic membrane, ear canal and external ear normal.  ?   Nose: Nose normal. No mucosal edema or rhinorrhea.  ?   Mouth/Throat:  ?   Pharynx: Uvula midline. No oropharyngeal exudate.  ?Eyes:  ?   Conjunctiva/sclera: Conjunctivae normal.  ?Neck:  ?   Thyroid: No thyromegaly.  ?   Trachea: Trachea normal. No tracheal tenderness or tracheal deviation.  ?Cardiovascular:  ?   Rate and Rhythm: Normal rate and regular rhythm.  ?   Heart sounds: Normal heart sounds, S1 normal and S2 normal. No murmur heard. ?Pulmonary:  ?   Effort: No respiratory distress.  ?   Breath sounds: Normal breath sounds. No stridor. No wheezing or rales.  ?Lymphadenopathy:  ?   Head:  ?   Right side of head: No tonsillar adenopathy.  ?   Left side of head: No tonsillar adenopathy.  ?   Cervical: No cervical adenopathy.  ?Skin: ?   Findings: No erythema or rash.  ?   Nails: There is no clubbing.  ?Neurological:  ?   Mental Status: She is alert.  ? ? ?Diagnostics:  ? ?Results of a chest x-ray obtained 10 August 2021 identifies the following: ? ?The heart size and mediastinal contours are within  normal limits. ?Aorta is tortuous with atherosclerotic calcifications. Both lungs ?are clear. The visualized skeletal structures are unremarkable. ? ?Results of a modified barium swallow obtained 23 September 2021 identifies the following: ? ?Pt presents with normal oropharyngeal swallow function c/b adequate mastication despite absence of teeth; good pharyngeal clearance with no subsequent residue; reliable laryngeal vestibule closure with no penetration nor aspiration. Pt coughed intermittently throughout study, but it was not associated with any airway intrusion. ? ?Assessment and Plan:  ? ?1. Perennial allergic rhinitis   ?2. LPRD (laryngopharyngeal reflux disease)   ? ?1.  Treat and prevent  inflammation: ? ?A. OTC Nasacort - 1 spray each nostril 1 time per day ?B. Montelukast 10 mg - 1 tablet 1 time per day ? ?2.  Treat and prevent reflux/LPR: ? ?A. Minimize caffeine and chocolate consumption ?B. Omeprazole 40 mg - 1 tablet 2 times per day ?C. Famotidine 40 mg - 1 tablet in PM ?D. Discontinue fish oil ? ?3.  If needed: ? ?A. Xyzal 5 mg - 1 tablet 1 time per day ? ?4. Blood - area 2 aeroallergen IgE profile ? ?5.  Return to clinic in 12 weeks or earlier if problem.   ? ?I will have Keelyn be a little bit more aggressive about treating her LPR with introduction of famotidine in the evening and elimination of her fish oil use.  As well, we will investigate her for possible aeroallergen hypersensitivity with the blood test noted above.  She will continue to use anti-inflammatory agents for her airway and therapy against LPR as noted above.  I will see her back in this clinic in 12 weeks or earlier if there is a problem.  I will contact her daughters with the results of her blood test once it is available for review. ? ?Laurette Schimke, MD ?Allergy / Immunology ?[facility] ?

## 2021-10-26 ENCOUNTER — Encounter: Payer: Self-pay | Admitting: Allergy and Immunology

## 2021-10-28 LAB — ALLERGENS W/TOTAL IGE AREA 2
Alternaria Alternata IgE: <0.1 kU/L
Aspergillus Fumigatus IgE: <0.1 kU/L
Bermuda Grass IgE: 0.2 kU/L — AB
Cat Dander IgE: 2.18 kU/L — AB
Cedar, Mountain IgE: 0.11 kU/L — AB
Cladosporium Herbarum IgE: <0.1 kU/L
Cockroach, German IgE: 0.18 kU/L — AB
Common Silver Birch IgE: <0.1 kU/L
Cottonwood IgE: <0.1 kU/L
D Farinae IgE: 2.76 kU/L — AB
D Pteronyssinus IgE: 2 kU/L — AB
Dog Dander IgE: 0.7 kU/L — AB
Elm, American IgE: <0.1 kU/L
IgE (Immunoglobulin E), Serum: 90 IU/mL (ref 6–495)
Johnson Grass IgE: 0.29 kU/L — AB
Maple/Box Elder IgE: <0.1 kU/L
Mouse Urine IgE: <0.1 kU/L
Oak, White IgE: <0.1 kU/L
Pecan, Hickory IgE: 0.11 kU/L — AB
Penicillium Chrysogen IgE: <0.1 kU/L
Pigweed, Rough IgE: <0.1 kU/L
Ragweed, Short IgE: 0.28 kU/L — AB
Sheep Sorrel IgE Qn: <0.1 kU/L
Timothy Grass IgE: 0.39 kU/L — AB
White Mulberry IgE: <0.1 kU/L

## 2021-11-10 ENCOUNTER — Telehealth: Payer: Self-pay

## 2021-11-10 NOTE — Telephone Encounter (Signed)
Prior Authorization has been started for Levocetirizine Dihydrochloride 5mg  tablets. KeyMervyn Gay

## 2021-11-18 NOTE — Telephone Encounter (Signed)
PA was denied for Xyzal twice a day however it is approved for once a day. Please advise.

## 2021-11-22 NOTE — Telephone Encounter (Signed)
Left message for patient or daughter to call the office in regards to Dr. Bruna Potter recommendation.

## 2021-12-01 DIAGNOSIS — F0280 Dementia in other diseases classified elsewhere without behavioral disturbance: Secondary | ICD-10-CM | POA: Diagnosis not present

## 2021-12-01 DIAGNOSIS — E785 Hyperlipidemia, unspecified: Secondary | ICD-10-CM | POA: Diagnosis not present

## 2021-12-01 DIAGNOSIS — I10 Essential (primary) hypertension: Secondary | ICD-10-CM | POA: Diagnosis not present

## 2021-12-01 DIAGNOSIS — M47816 Spondylosis without myelopathy or radiculopathy, lumbar region: Secondary | ICD-10-CM | POA: Diagnosis not present

## 2021-12-01 DIAGNOSIS — E119 Type 2 diabetes mellitus without complications: Secondary | ICD-10-CM | POA: Diagnosis not present

## 2021-12-01 DIAGNOSIS — G309 Alzheimer's disease, unspecified: Secondary | ICD-10-CM | POA: Diagnosis not present

## 2021-12-01 DIAGNOSIS — K219 Gastro-esophageal reflux disease without esophagitis: Secondary | ICD-10-CM | POA: Diagnosis not present

## 2021-12-01 NOTE — Telephone Encounter (Signed)
Called patient and was informed that someone had already informed them of Dr. Johney Maine' recommendation.

## 2022-01-31 ENCOUNTER — Ambulatory Visit: Payer: Medicare PPO | Admitting: Allergy and Immunology

## 2022-01-31 VITALS — BP 134/60 | HR 69 | Temp 98.3°F | Resp 16 | Ht 61.0 in | Wt 160.0 lb

## 2022-01-31 DIAGNOSIS — K219 Gastro-esophageal reflux disease without esophagitis: Secondary | ICD-10-CM

## 2022-01-31 DIAGNOSIS — N3000 Acute cystitis without hematuria: Secondary | ICD-10-CM

## 2022-01-31 DIAGNOSIS — J3089 Other allergic rhinitis: Secondary | ICD-10-CM

## 2022-01-31 NOTE — Progress Notes (Unsigned)
[place] - [place] - [place] - [place] - [place]   Follow-up Note  Referring Provider: [name], MD Primary Provider: [name], MD Date of Office Visit: 01/31/2022  Subjective:   Christina Delgado (DOB: 07/11/1930) is a 88 y.o. female who [facility]22 in re-evaluation of the following:  HPI: Christina Delgado returns to this clinic in evaluation of cough.  I last saw her in this clinic on 25 Oct 2021.  During her last visit we treated her more aggressively for LPR by eliminating all fish oil use and adding famotidine at nighttime to her omeprazole.  Her cough has improved significantly and in fact she has not had cough over the course of the past month.  Her daughter has stopped her famotidine but does continue to have her use omeprazole twice a day and has not reintroduced her fish oil.  She does not use any nasal steroid at this point in time as her nose is doing well but she does continue on montelukast.  Her daughter believes that she might have a urinary tract infection as she is getting a little bit confused and a little bit lethargic recently.  Her daughter brings in a sample of her urine.  Allergies as of 01/31/2022   No Known Allergies      Medication List    albuterol 108 (90 Base) MCG/ACT inhaler Commonly known as: VENTOLIN HFA Inhale into the lungs.   amLODipine 5 MG tablet Commonly known as: NORVASC Take 1 tablet (5 mg total) by mouth daily.   brimonidine 0.15 % ophthalmic solution Commonly known as: ALPHAGAN Place 1 drop into both eyes 2 (two) times daily.   brimonidine 0.2 % ophthalmic solution Commonly known as: ALPHAGAN 1 drop 2 (two) times daily.   Calcium Carbonate-Vitamin D 600-400 MG-UNIT tablet Take 1 tablet by mouth daily.   Cranberry 450 MG Tabs Take as directed on package for Urinary health   [employer] SPECTRAVITE PO Take 1 capsule by mouth daily.   donepezil 10 MG tablet Commonly known  as: Aricept Take 1 tablet (10 mg total) by mouth at bedtime.   DORZOLAMIDE HCL-TIMOLOL MAL OP Apply 1 drop to eye 2 (two) times daily.   dorzolamide-timolol 22.3-6.8 MG/ML ophthalmic solution Commonly known as: COSOPT SMARTSIG:1 Drop(s) In Eye(s) Twice Daily PRN   famotidine 40 MG tablet Commonly known as: PEPCID Take 1 tablet (40 mg total) by mouth at bedtime.   latanoprost 0.005 % ophthalmic solution Commonly known as: XALATAN Place 1 drop into the left eye at bedtime.   levocetirizine 5 MG tablet Commonly known as: XYZAL Take 1 tablet (5 mg total) by mouth 2 (two) times daily as needed for allergies.   memantine 10 MG tablet Commonly known as: NAMENDA Take 10 mg by mouth 2 (two) times daily.   mirtazapine 7.5 MG tablet Commonly known as: REMERON TAKE 1 TABLET AT BEDTIME FOR MOOD/SLEEP/APPETITE   montelukast 10 MG tablet Commonly known as: SINGULAIR Take 1 tablet (10 mg total) by mouth at bedtime.   omeprazole 40 MG capsule Commonly known as: PRILOSEC Take 1 capsule (40 mg total) by mouth in the morning and at bedtime.   Osteo Bi-Flex Triple Strength Tabs Take 4,000 each by mouth daily. Patient takes 2000 mg(2 tablets at one time daily)   potassium chloride SA 20 MEQ tablet Commonly known as: KLOR-CON M Take 1 tablet (20 mEq total) by mouth daily. Ok to dissolve in 4 ounces of  water and drink. DO NOT CRUSH.   promethazine-dextromethorphan 6.25-15 MG/5ML syrup Commonly known as: PROMETHAZINE-DM Take 2.5 mLs by mouth 4 (four) times daily as needed for cough.   triamcinolone 55 MCG/ACT Aero nasal inhaler Commonly known as: NASACORT Place 1 spray into the nose 2 (two) times daily.    Past Medical History:  Diagnosis Date   Ankle fracture, left    Asthma    COVID winter 2021-2022   Generalized osteoarthrosis, involving multiple sites    Glaucoma    Hepatitis, unspecified    Hypertension    Hypopotassemia    Osteoporosis     Past Surgical History:   Procedure Laterality Date   FRACTURE SURGERY Left        REFRACTIVE SURGERY      Review of systems negative except as noted in HPI / PMHx or noted below:  Review of Systems  Constitutional: Negative.   HENT: Negative.    Eyes: Negative.   Respiratory: Negative.    Cardiovascular: Negative.   Gastrointestinal: Negative.   Genitourinary: Negative.   Musculoskeletal: Negative.   Skin: Negative.   Neurological: Negative.   Endo/Heme/Allergies: Negative.   Psychiatric/Behavioral: Negative.       Objective:   Vitals:   01/31/22 1551  BP: 134/60  Pulse: 69  Resp: 16  Temp: 98.3 F (36.8 C)  SpO2: 99%   Height: 5\' 1"  (154.9 cm) (Verbal)  Weight: 160 lb (72.6 kg) (Verbal)   Physical Exam Constitutional:      Appearance: She is not diaphoretic.  HENT:     Head: Normocephalic.     Right Ear: External ear normal.     Left Ear: External ear normal.     Nose: Nose normal. No mucosal edema or rhinorrhea.     Mouth/Throat:     Pharynx: Uvula midline.  Eyes:     Conjunctiva/sclera: Conjunctivae normal.  Neck:     Thyroid: No thyromegaly.     Trachea: Trachea normal. No tracheal tenderness or tracheal deviation.  Cardiovascular:     Rate and Rhythm: Normal rate and regular rhythm.     Heart sounds: Normal heart sounds, S1 normal and S2 normal. No murmur heard. Pulmonary:     Effort: No respiratory distress.     Breath sounds: Normal breath sounds. No stridor. No wheezing or rales.  Lymphadenopathy:     Head:     Right side of head: No tonsillar adenopathy.     Left side of head: No tonsillar adenopathy.     Cervical: No cervical adenopathy.  Skin:    Findings: No erythema or rash.     Nails: There is no clubbing.  Neurological:     Mental Status: She is alert.     Diagnostics: Results of blood tests obtained 25 Oct 2021 identifies IgE antibodies directed against dust mite, cat, dog, grasses, trees, ragweed with a total IgE of 98 KU/L  Assessment and Plan:    1. LPRD (laryngopharyngeal reflux disease)   2. Perennial allergic rhinitis   3. Acute cystitis without hematuria    1.  Treat and prevent inflammation:  A. Montelukast 10 mg - 1 tablet 1 time per day  2.  Treat and prevent reflux/LPR:  A. Minimize caffeine and chocolate consumption B. Omeprazole 40 mg - 1 tablet 2 times per day C. Famotidine 40 mg - 1 tablet in PM IF NEEDED D. No fish oil  3.  If needed:  A. Xyzal 5 mg - 1 tablet 1 time per  day B. OTC Nasacort - 1 spray each nostril 1 time per day  4.  Urine for urine analysis and culture and sensitivity for urinary tract infection  5.  Return to clinic in December 2023 or earlier if problem.    6.  Plan for fall flu vaccine and RSV vaccine.  Elliora is doing better at this point and I would like for her to consistently use her omeprazole twice a day and montelukast to address her reflux induced respiratory disease and her atopic respiratory disease and she can add in famotidine should it be required and other medications directed against atopic disease should they be required as noted above.  We will investigate for a possible urinary tract infection with the test noted above.  I will contact her daughter with results once they are available for review.  Allena Katz, MD Allergy / Immunology [place]

## 2022-01-31 NOTE — Patient Instructions (Addendum)
  1.  Treat and prevent inflammation:  A. Montelukast 10 mg - 1 tablet 1 time per day  2.  Treat and prevent reflux/LPR:  A. Minimize caffeine and chocolate consumption B. Omeprazole 40 mg - 1 tablet 2 times per day C. Famotidine 40 mg - 1 tablet in PM IF NEEDED D. No fish oil  3.  If needed:  A. Xyzal 5 mg - 1 tablet 1 time per day B. OTC Nasacort - 1 spray each nostril 1 time per day  4.  Urine for urine analysis and culture and sensitivity for urinary tract infection  5.  Return to clinic in December 2023 or earlier if problem.    6.  Plan for fall flu vaccine and RSV vaccine.

## 2022-02-01 ENCOUNTER — Encounter: Payer: Self-pay | Admitting: Allergy and Immunology

## 2022-02-02 ENCOUNTER — Encounter: Payer: Self-pay | Admitting: Allergy and Immunology

## 2022-02-02 LAB — URINE CULTURE

## 2022-02-03 LAB — ORGANIC ACID ANALYSIS, URINE

## 2022-02-16 DIAGNOSIS — Z23 Encounter for immunization: Secondary | ICD-10-CM | POA: Diagnosis not present

## 2022-02-24 DIAGNOSIS — Z961 Presence of intraocular lens: Secondary | ICD-10-CM | POA: Diagnosis not present

## 2022-02-24 DIAGNOSIS — H2511 Age-related nuclear cataract, right eye: Secondary | ICD-10-CM | POA: Diagnosis not present

## 2022-02-24 DIAGNOSIS — H26492 Other secondary cataract, left eye: Secondary | ICD-10-CM | POA: Diagnosis not present

## 2022-02-24 DIAGNOSIS — H401133 Primary open-angle glaucoma, bilateral, severe stage: Secondary | ICD-10-CM | POA: Diagnosis not present

## 2022-03-02 DIAGNOSIS — B351 Tinea unguium: Secondary | ICD-10-CM | POA: Diagnosis not present

## 2022-03-02 DIAGNOSIS — M79676 Pain in unspecified toe(s): Secondary | ICD-10-CM | POA: Diagnosis not present

## 2022-03-10 DIAGNOSIS — N898 Other specified noninflammatory disorders of vagina: Secondary | ICD-10-CM | POA: Diagnosis not present

## 2022-03-10 DIAGNOSIS — G309 Alzheimer's disease, unspecified: Secondary | ICD-10-CM | POA: Diagnosis not present

## 2022-03-10 DIAGNOSIS — M542 Cervicalgia: Secondary | ICD-10-CM | POA: Diagnosis not present

## 2022-03-10 DIAGNOSIS — E119 Type 2 diabetes mellitus without complications: Secondary | ICD-10-CM | POA: Diagnosis not present

## 2022-03-10 DIAGNOSIS — E876 Hypokalemia: Secondary | ICD-10-CM | POA: Diagnosis not present

## 2022-03-10 DIAGNOSIS — E785 Hyperlipidemia, unspecified: Secondary | ICD-10-CM | POA: Diagnosis not present

## 2022-03-10 DIAGNOSIS — K219 Gastro-esophageal reflux disease without esophagitis: Secondary | ICD-10-CM | POA: Diagnosis not present

## 2022-03-10 DIAGNOSIS — I10 Essential (primary) hypertension: Secondary | ICD-10-CM | POA: Diagnosis not present

## 2022-03-10 DIAGNOSIS — R300 Dysuria: Secondary | ICD-10-CM | POA: Diagnosis not present

## 2022-04-07 DIAGNOSIS — H903 Sensorineural hearing loss, bilateral: Secondary | ICD-10-CM | POA: Diagnosis not present

## 2022-04-07 DIAGNOSIS — H6123 Impacted cerumen, bilateral: Secondary | ICD-10-CM | POA: Diagnosis not present

## 2022-05-02 ENCOUNTER — Other Ambulatory Visit: Payer: Self-pay | Admitting: Allergy and Immunology

## 2022-05-05 DIAGNOSIS — H26492 Other secondary cataract, left eye: Secondary | ICD-10-CM | POA: Diagnosis not present

## 2022-06-27 ENCOUNTER — Other Ambulatory Visit: Payer: Self-pay

## 2022-06-27 ENCOUNTER — Ambulatory Visit: Payer: Medicare PPO | Admitting: Allergy and Immunology

## 2022-06-27 ENCOUNTER — Encounter: Payer: Self-pay | Admitting: Allergy and Immunology

## 2022-06-27 VITALS — BP 118/76 | HR 71 | Temp 97.7°F | Resp 16 | Ht 62.0 in | Wt 160.4 lb

## 2022-06-27 DIAGNOSIS — J3089 Other allergic rhinitis: Secondary | ICD-10-CM

## 2022-06-27 DIAGNOSIS — K219 Gastro-esophageal reflux disease without esophagitis: Secondary | ICD-10-CM

## 2022-06-27 MED ORDER — LEVOCETIRIZINE DIHYDROCHLORIDE 5 MG PO TABS: ORAL_TABLET | ORAL | 11 refills | Status: DC

## 2022-06-27 MED ORDER — FAMOTIDINE 40 MG PO TABS: ORAL_TABLET | ORAL | 11 refills | Status: AC

## 2022-06-27 MED ORDER — MONTELUKAST SODIUM 10 MG PO TABS: 10.0000 mg | ORAL_TABLET | Freq: Every day | ORAL | 11 refills | Status: DC

## 2022-06-27 MED ORDER — OMEPRAZOLE 40 MG PO CPDR: 40.0000 mg | DELAYED_RELEASE_CAPSULE | Freq: Two times a day (BID) | ORAL | 11 refills | Status: DC

## 2022-06-27 NOTE — Patient Instructions (Addendum)
  1.  Treat and prevent inflammation:  A. Montelukast 10 mg - 1 tablet 1 time per day  2.  Treat and prevent reflux/LPR:  A. Minimize caffeine and chocolate consumption B. Omeprazole 40 mg - 1 tablet 2 times per day C. Famotidine 40 mg - 1 tablet in PM IF NEEDED D. No fish oil  3.  If needed:  A. Xyzal 5 mg - 1 tablet 1 time per day B. OTC Nasacort - 1 spray each nostril 1 time per day C. Tessalon   4. Return to clinic in 1 year or earlier if problem

## 2022-06-27 NOTE — Progress Notes (Unsigned)
Christina Delgado   Follow-up Note  Referring Provider: Chesley Noon, MD Primary Provider: Chesley Noon, MD Date of Office Visit: 06/27/2022  Subjective:   Christina Delgado (DOB: 1930-09-20) is a 89 y.o. female who [facility] to [facility] on 06/27/2022 in re-evaluation of the following:  HPI: Christina Delgado returns to the clinic in evaluation of cough believed secondary to a combination of reflux induced respiratory disease and inflammation of airway..  I last saw her in this clinic on 31 January 2022.  She continues to do quite well with her cough while utilizing therapy directed against reflux which at this point in time means aluminating or fish oil and using omeprazole twice a day and also taking montelukast.  Rarely does she use nasal sinus steroid.  Her use of a Tessalon [place] is 1 time per week.  She has had no issues with her upper airway.  She has received the flu vaccine, RSV vaccine, and COVID-vaccine.  Allergies as of 06/27/2022   No Known Allergies      Medication List    albuterol 108 (90 Base) MCG/ACT inhaler Commonly known as: VENTOLIN HFA Inhale into the lungs.   amLODipine 5 MG tablet Commonly known as: NORVASC Take 1 tablet (5 mg total) by mouth daily.   azelastine 0.1 % nasal spray Commonly known as: ASTELIN Place 1 spray into both nostrils 2 (two) times daily.   benzonatate 100 MG capsule Commonly known as: TESSALON Take 100 mg by mouth 3 (three) times daily as needed for cough.   brimonidine 0.15 % ophthalmic solution Commonly known as: ALPHAGAN Place 1 drop into both eyes 2 (two) times daily.   brimonidine 0.2 % ophthalmic solution Commonly known as: ALPHAGAN 1 drop 2 (two) times daily.   Calcium Carbonate-Vitamin D 600-400 MG-UNIT tablet Take 1 tablet by mouth daily.   [employer] SPECTRAVITE PO Take 1 capsule by mouth daily.   donepezil 10 MG tablet Commonly known as: Aricept Take 1  tablet (10 mg total) by mouth at bedtime.   DORZOLAMIDE HCL-TIMOLOL MAL OP Apply 1 drop to eye 2 (two) times daily.   dorzolamide-timolol 2-0.5 % ophthalmic solution Commonly known as: COSOPT SMARTSIG:1 Drop(s) In Eye(s) Twice Daily PRN   Flovent HFA 220 MCG/ACT inhaler Generic drug: fluticasone Inhale 2 puffs into the lungs 2 (two) times daily.   latanoprost 0.005 % ophthalmic solution Commonly known as: XALATAN Place 1 drop into the left eye at bedtime.   levocetirizine 5 MG tablet Commonly known as: XYZAL Take 1 tablet (5 mg total) by mouth 2 (two) times daily as needed for allergies.   memantine 10 MG tablet Commonly known as: NAMENDA Take 10 mg by mouth 2 (two) times daily.   mirtazapine 7.5 MG tablet Commonly known as: REMERON TAKE 1 TABLET AT BEDTIME FOR MOOD/SLEEP/APPETITE   montelukast 10 MG tablet Commonly known as: SINGULAIR TAKE ONE TABLET AT BEDTIME   omeprazole 40 MG capsule Commonly known as: PRILOSEC TAKE ONE CAPSULE TWICE DAILY   Osteo Bi-Flex Triple Strength Tabs Take 4,000 each by mouth daily. Patient takes 2000 mg(2 tablets at one time daily)   potassium chloride SA 20 MEQ tablet Commonly known as: KLOR-CON M Take 1 tablet (20 mEq total) by mouth daily. Ok to dissolve in 4 ounces of water and drink. DO NOT CRUSH.   promethazine-dextromethorphan 6.25-15 MG/5ML syrup Commonly known as: PROMETHAZINE-DM Take 2.5 mLs by mouth 4 (four) times daily  as needed for cough.   triamcinolone 55 MCG/ACT Aero nasal inhaler Commonly known as: NASACORT Place 1 spray into the nose 2 (two) times daily.    Past Medical History:  Diagnosis Date   Ankle fracture, left    Asthma    COVID winter 2021-2022   Generalized osteoarthrosis, involving multiple sites    Glaucoma    Hepatitis, unspecified    Hypertension    Hypopotassemia    Osteoporosis     Past Surgical History:  Procedure Laterality Date   FRACTURE SURGERY Left        REFRACTIVE SURGERY       Review of systems negative except as noted in HPI / PMHx or noted below:  Review of Systems  Constitutional: Negative.   HENT: Negative.    Eyes: Negative.   Respiratory: Negative.    Cardiovascular: Negative.   Gastrointestinal: Negative.   Genitourinary: Negative.   Musculoskeletal: Negative.   Skin: Negative.   Neurological: Negative.   Endo/Heme/Allergies: Negative.   Psychiatric/Behavioral: Negative.       Objective:   Vitals:   06/27/22 1536  BP: 118/76  Pulse: 71  Resp: 16  Temp: 97.7 F (36.5 C)  SpO2: 100%   Height: 5\' 2"  (157.5 cm)  Weight: 160 lb 6.4 oz (72.8 kg)   Physical Exam Constitutional:      Appearance: She is not diaphoretic.  HENT:     Head: Normocephalic.     Right Ear: External ear normal.     Left Ear: External ear normal.     Ears:     Comments: Bilateral hearing aids    Nose: Nose normal. No mucosal edema or rhinorrhea.     Mouth/Throat:     Pharynx: Uvula midline. No oropharyngeal exudate.  Eyes:     Conjunctiva/sclera: Conjunctivae normal.  Neck:     Thyroid: No thyromegaly.     Trachea: Trachea normal. No tracheal tenderness or tracheal deviation.  Cardiovascular:     Rate and Rhythm: Normal rate and regular rhythm.     Heart sounds: Normal heart sounds, S1 normal and S2 normal. No murmur heard. Pulmonary:     Effort: No respiratory distress.     Breath sounds: Normal breath sounds. No stridor. No wheezing or rales.  Lymphadenopathy:     Head:     Right side of head: No tonsillar adenopathy.     Left side of head: No tonsillar adenopathy.     Cervical: No cervical adenopathy.  Skin:    Findings: No erythema or rash.     Nails: There is no clubbing.  Neurological:     Mental Status: She is alert.     Diagnostics: none  Assessment and Plan:   1. LPRD (laryngopharyngeal reflux disease)   2. Perennial allergic rhinitis     1.  Treat and prevent inflammation:  A. Montelukast 10 mg - 1 tablet 1 time per  day  2.  Treat and prevent reflux/LPR:  A. Minimize caffeine and chocolate consumption B. Omeprazole 40 mg - 1 tablet 2 times per day C. Famotidine 40 mg - 1 tablet in PM IF NEEDED D. No fish oil  3.  If needed:  A. Xyzal 5 mg - 1 tablet 1 time per day B. OTC Nasacort - 1 spray each nostril 1 time per day C. Tessalon   4. Return to clinic in 1 year or earlier if problem   Allena Katz, MD Allergy / [place]

## 2022-06-28 ENCOUNTER — Encounter: Payer: Self-pay | Admitting: Allergy and Immunology

## 2022-08-01 IMAGING — CT CT HEAD W/O CM
1 series · 1 of 1 positions shown · non-contrast
Comparison: January 11, 2019

CLINICAL DATA: Delirium with seizure-like activity.

EXAM:
CT HEAD WITHOUT CONTRAST
TECHNIQUE: Contiguous axial images were obtained from the base of the skull
through the vertex without intravenous contrast.

[Series 1: topogram 0.6 t20f · sagittal · 1.00mm/px · 1 of 1 slices shown]
[im 1/1]
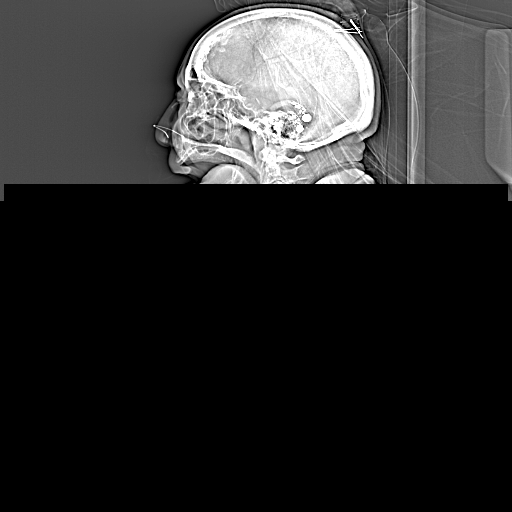

[1 of 1 positions shown; findings below may reference images not displayed]

FINDINGS: Brain: No evidence of acute large vascular territory infarction,
hemorrhage, hydrocephalus, extra-axial collection or mass
lesion/mass effect. Similar moderate burden of chronic ischemic
white matter disease. Bilateral basal ganglia mineralization. Dense
dural calcifications.

Vascular: No hyperdense vessel. Atherosclerotic calcifications of
the internal carotid arteries at the skull base.

Skull: Hyperostosis interna.  Negative for fracture or focal lesion.

Sinuses/Orbits: The paranasal sinuses and mastoid air cells are
predominantly clear. Orbits are grossly unremarkable.

Other: None
IMPRESSION: 1. No acute intracranial findings.
2. Similar moderate burden of chronic ischemic white matter disease.

## 2022-08-09 IMAGING — DX DG CHEST 1V PORT
1 series · 1 of 1 positions shown · non-contrast
Comparison: Radiograph 01/11/2019

CLINICAL DATA: Weakness, nausea and vomiting, history of [id number]-TG

EXAM:
PORTABLE CHEST 1 VIEW

[chest ap]
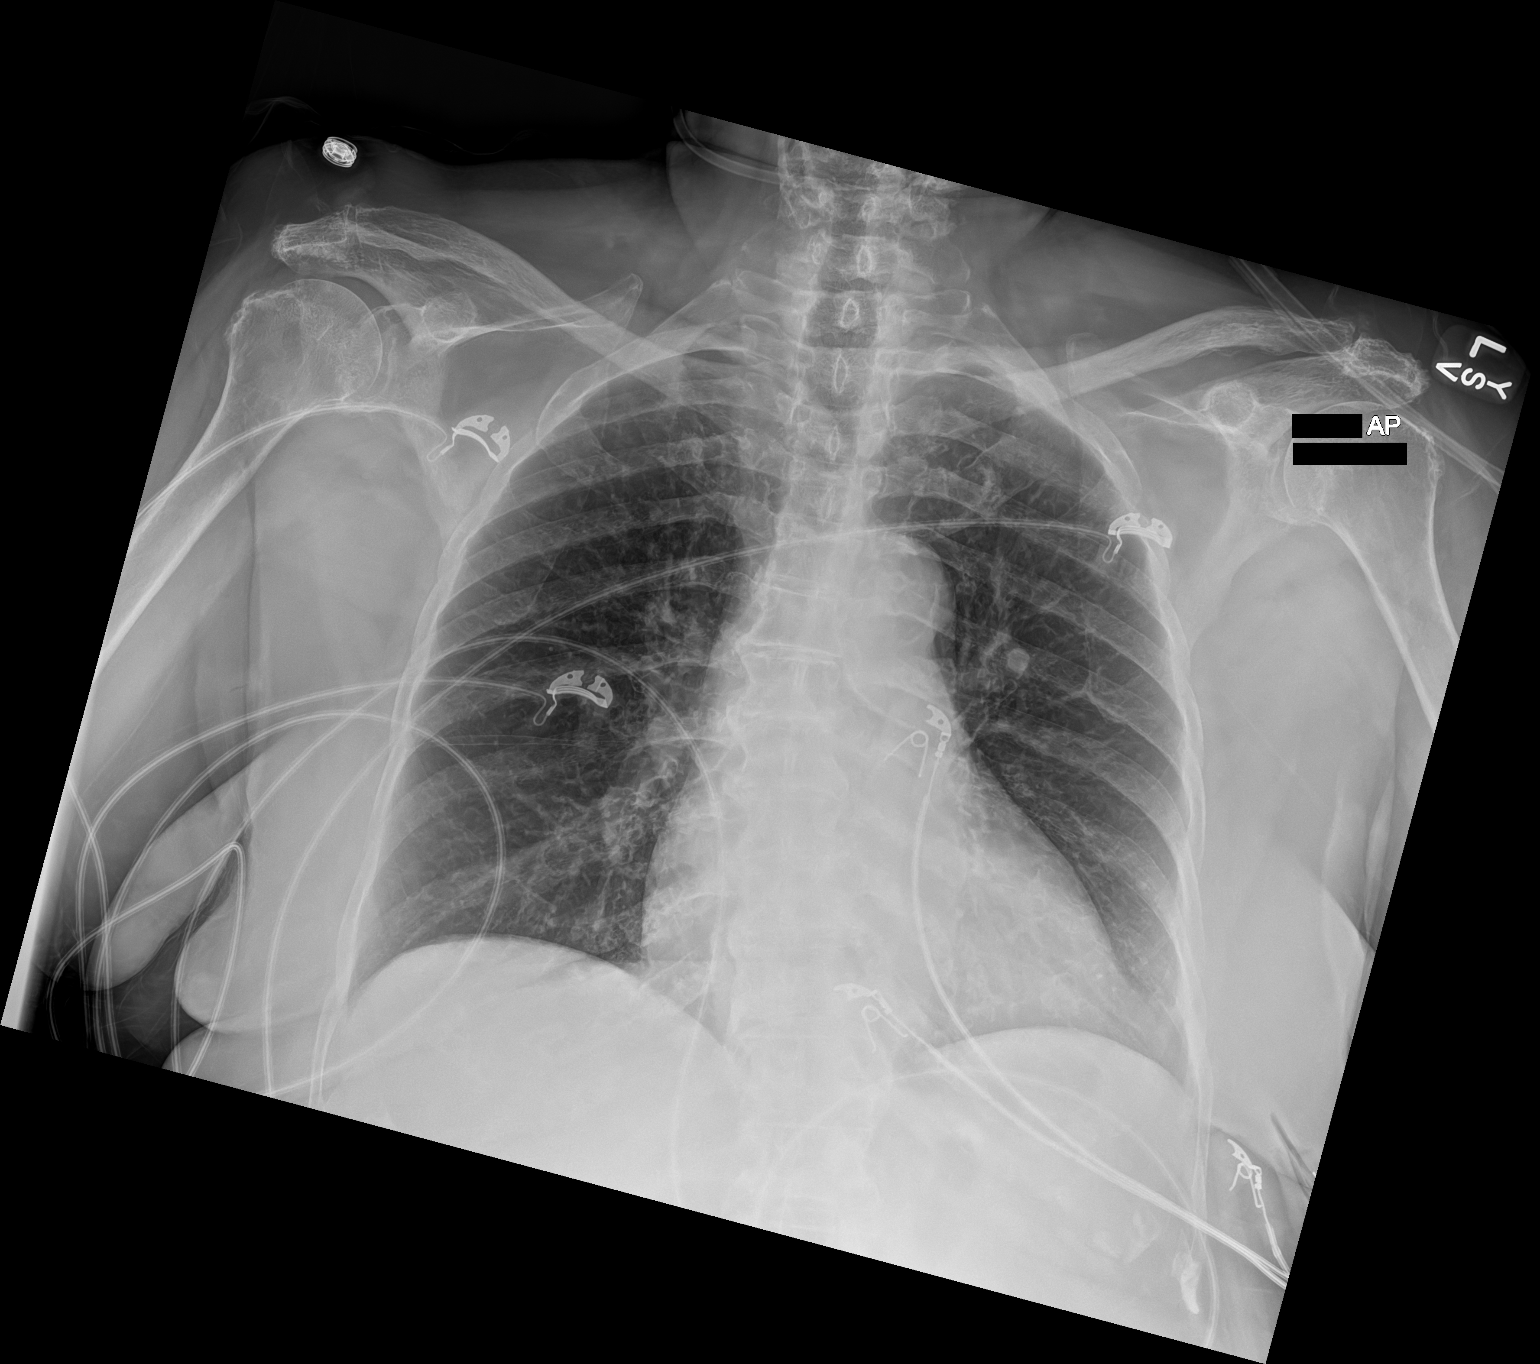

[1 of 1 positions shown; findings below may reference images not displayed]

FINDINGS: Chronically coarsened interstitial and bronchitic changes. No
consolidation, features of edema, pneumothorax, or effusion. The
aorta is calcified. The remaining cardiomediastinal contours are
unremarkable. Rounded density towards the left hilar point is likely
vessel on end seen on priors. No acute osseous or soft tissue
abnormality. Degenerative changes are present in the imaged spine
and shoulders. Telemetry leads overlie the chest.
IMPRESSION: No acute cardiopulmonary abnormality.

Chronically coarsened interstitial and bronchitic changes.

## 2022-08-09 IMAGING — US US ABDOMEN LIMITED RUQ/ASCITES
1 series · 14 of 25 positions shown · non-contrast
Comparison: CT abdomen and pelvis, 10/29/2020 at [DATE] a.m.

CLINICAL DATA: Elevated liver function tests. Abdominal pain and
weakness. Follow-up from current abdomen and pelvis CT.

EXAM:
ULTRASOUND ABDOMEN LIMITED RIGHT UPPER QUADRANT

[Series 1: us abdomen limited ruq (liver/gb) · 14 of 47 slices shown]
[im 1/47]
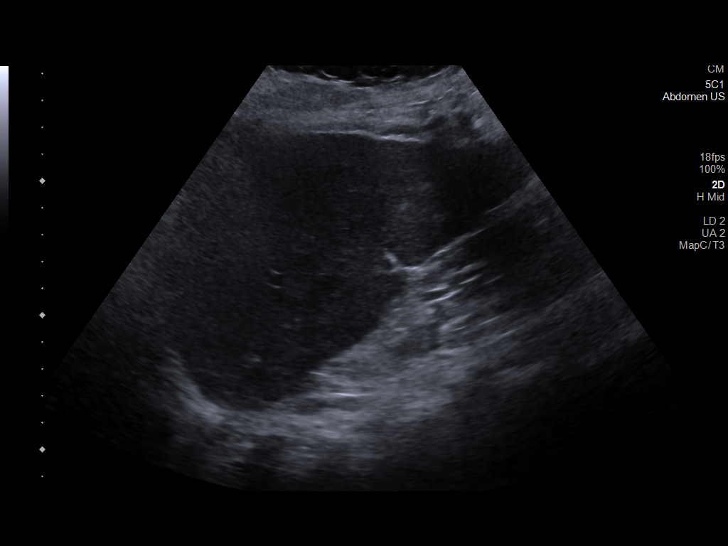
[im 4/47]
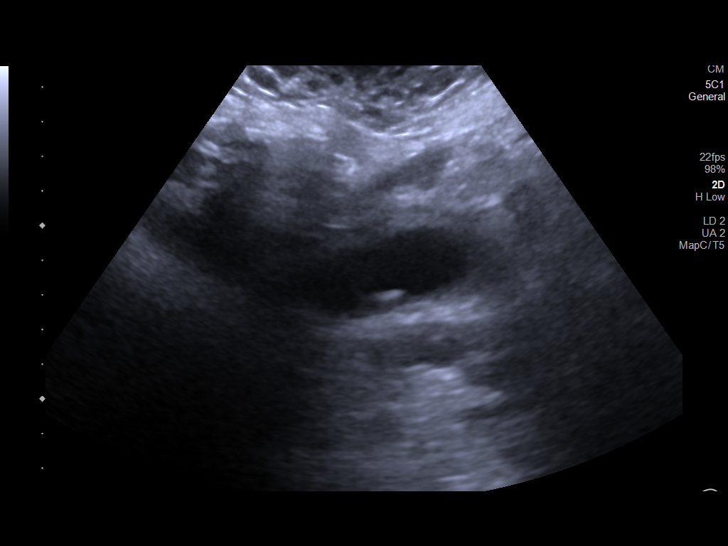
[im 8/47]
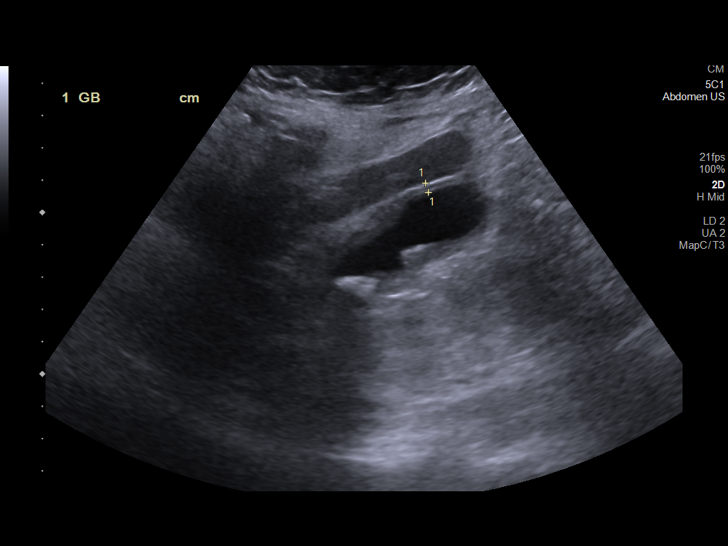
[im 12/47]
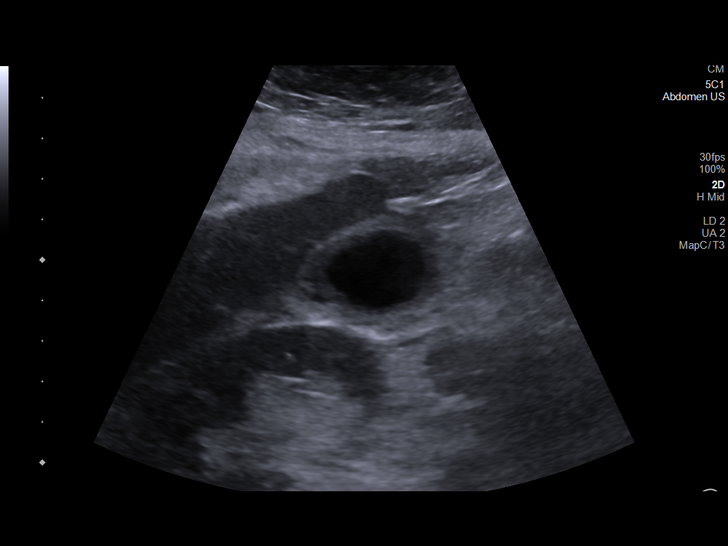
[im 16/47]
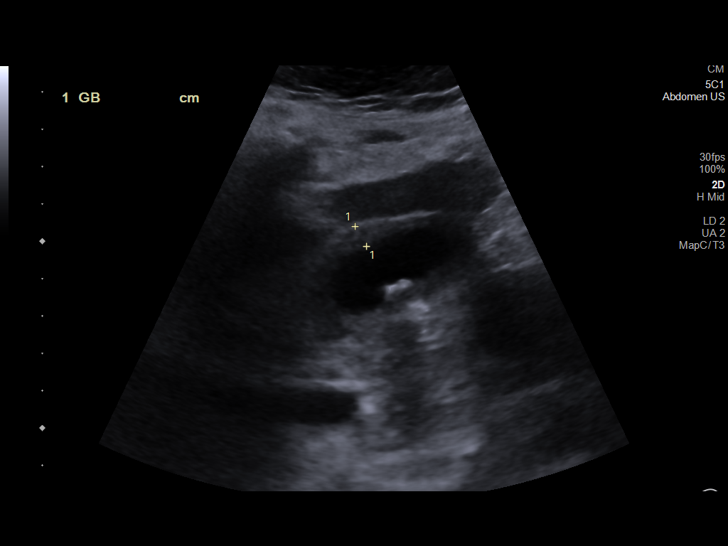
[im 18/47]
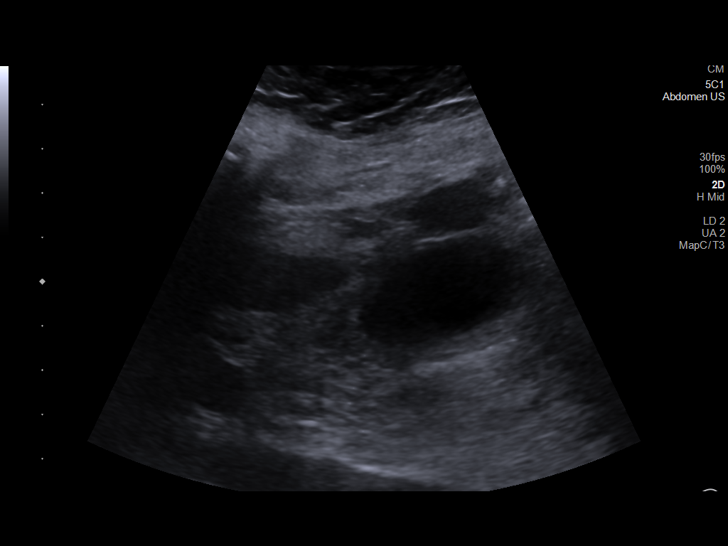
[im 22/47]
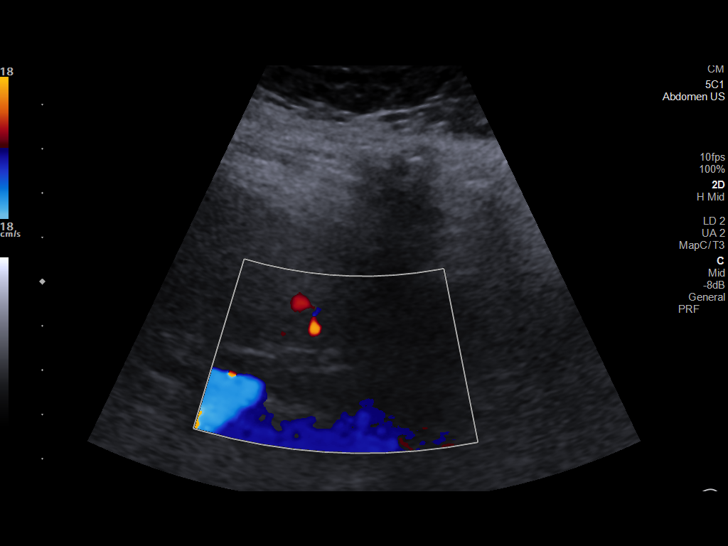
[im 25/47]
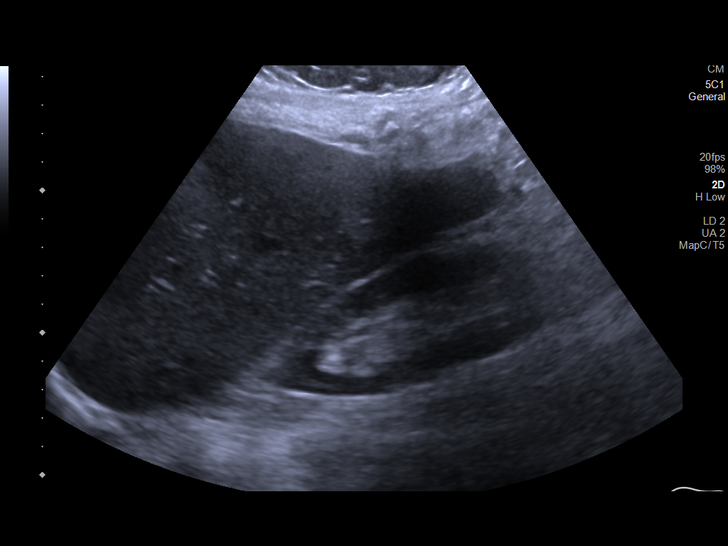
[im 29/47]
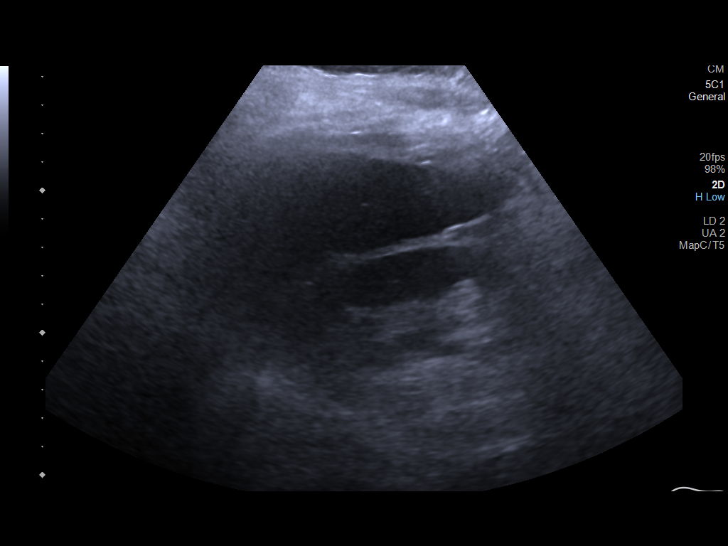
[im 31/47]
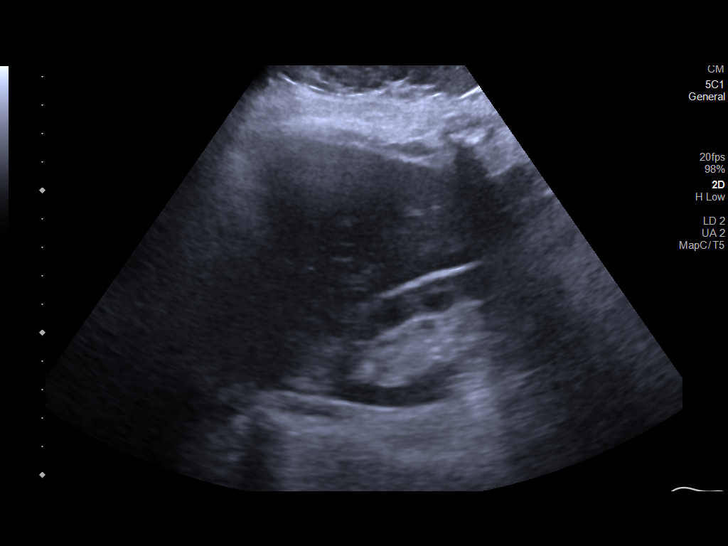
[im 35/47]
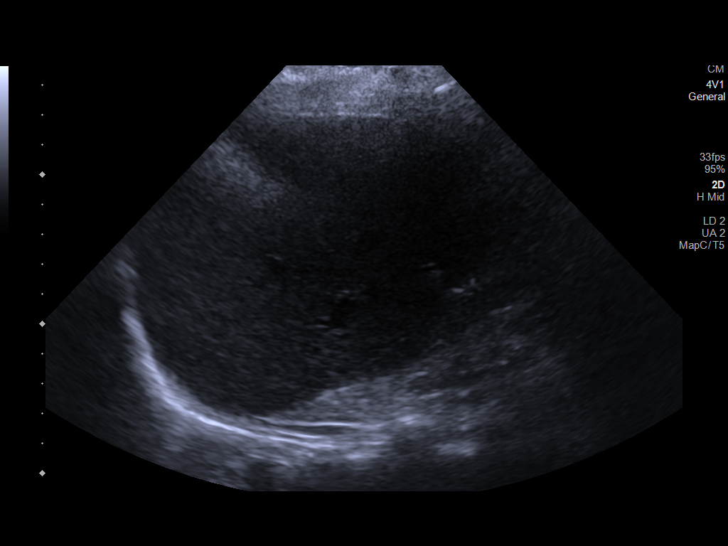
[im 39/47]
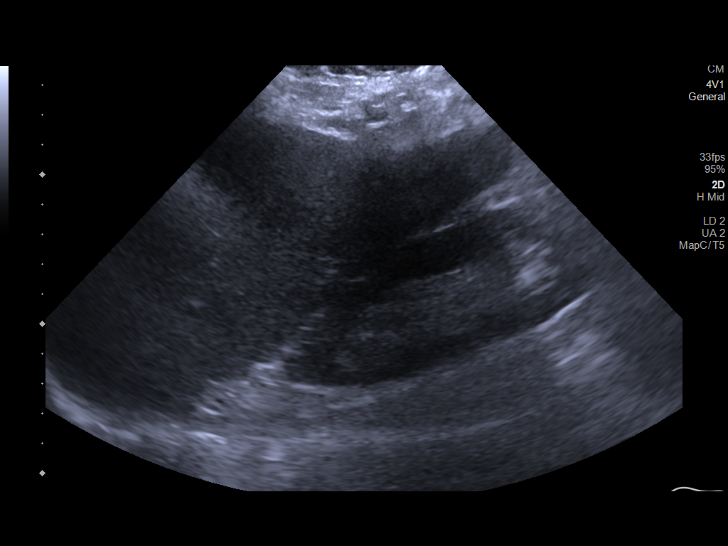
[im 43/47]
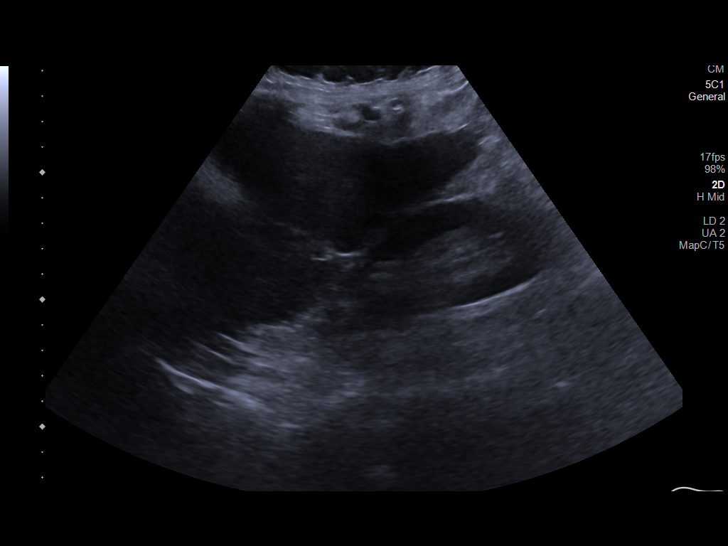
[im 47/47]
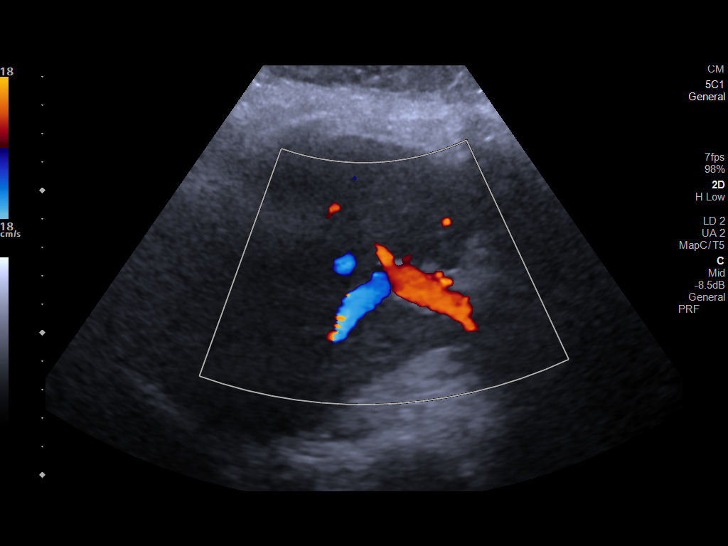

[14 of 25 positions shown; findings below may reference images not displayed]

FINDINGS: Gallbladder:

Dependent gallstones. There is wall thickening to 6 mm. Wall is
irregular. Largest gallstone, 1.3 cm.

Common bile duct:

Diameter: 3 mm

Liver:

No focal lesion identified. Within normal limits in parenchymal
echogenicity. Portal vein is patent on color Doppler imaging with
normal direction of blood flow towards the liver.

Other: None.
IMPRESSION: 1. Multiple gallstones with irregular gallbladder wall thickening.
Gallbladder is mild-to-moderately distended. Findings support acute
cholecystitis in the proper clinical setting.
2. No other abnormalities.

## 2022-10-02 ENCOUNTER — Emergency Department (HOSPITAL_BASED_OUTPATIENT_CLINIC_OR_DEPARTMENT_OTHER)
Admission: EM | Admit: 2022-10-02 | Discharge: 2022-10-02 | Disposition: A | Discharge status: Home / Self Care | Payer: Medicare PPO | Attending: Emergency Medicine | Admitting: Emergency Medicine

## 2022-10-02 ENCOUNTER — Other Ambulatory Visit: Payer: Self-pay

## 2022-10-02 ENCOUNTER — Encounter (HOSPITAL_BASED_OUTPATIENT_CLINIC_OR_DEPARTMENT_OTHER): Payer: Self-pay | Admitting: Emergency Medicine

## 2022-10-02 ENCOUNTER — Emergency Department (HOSPITAL_BASED_OUTPATIENT_CLINIC_OR_DEPARTMENT_OTHER): Payer: Medicare PPO

## 2022-10-02 DIAGNOSIS — W050XXA Fall from non-moving wheelchair, initial encounter: Secondary | ICD-10-CM | POA: Insufficient documentation

## 2022-10-02 DIAGNOSIS — S8002XA Contusion of left knee, initial encounter: Secondary | ICD-10-CM | POA: Diagnosis not present

## 2022-10-02 DIAGNOSIS — S93401A Sprain of unspecified ligament of right ankle, initial encounter: Secondary | ICD-10-CM

## 2022-10-02 DIAGNOSIS — S8991XA Unspecified injury of right lower leg, initial encounter: Secondary | ICD-10-CM | POA: Diagnosis present

## 2022-10-02 NOTE — ED Triage Notes (Addendum)
Patient fall with caregiver.happened yesterday Per family patient slid to floor, no c/o pain and swelling in both legs. Right worse than left

## 2022-10-02 NOTE — ED Provider Notes (Signed)
[facility] Provider Note   CSN: [id number] Arrival date & time: 10/02/22  1732     History  Chief Complaint  Patient presents with   Leg Injury    Christina Delgado is a 89 y.o. female.  Patient's family member reports patient slid out of her wheelchair yesterday.  Patient had an aide who is with her who helped her to the floor patient went down on her right ankle and left knee.  Patient has been able to stand since the fall.  Patient did not have any impact to her head or her neck patient did not fall on her hip  The history is provided by a relative. No language interpreter was used.       Home Medications Prior to Admission medications   Medication Sig Start Date End Date Taking? Authorizing Provider  albuterol (VENTOLIN HFA) 108 (90 Base) MCG/ACT inhaler Inhale into the lungs. 12/01/21   [provider]  amLODipine (NORVASC) 5 MG tablet Take 1 tablet (5 mg total) by mouth daily. 08/10/21   Dettinger, Elige Radon, MD  azelastine (ASTELIN) 0.1 % nasal spray Place 1 spray into both nostrils 2 (two) times daily. 05/25/21   Raliegh Ip, DO  benzonatate (TESSALON) 100 MG capsule Take 100 mg by mouth 3 (three) times daily as needed for cough. 06/08/22   [provider]  brimonidine (ALPHAGAN) 0.15 % ophthalmic solution Place 1 drop into both eyes 2 (two) times daily.    [provider]  brimonidine (ALPHAGAN) 0.2 % ophthalmic solution 1 drop 2 (two) times daily. 10/25/21   [provider]  Calcium Carbonate-Vitamin D 600-400 MG-UNIT tablet Take 1 tablet by mouth daily.    [provider]  donepezil (ARICEPT) 10 MG tablet Take 1 tablet (10 mg total) by mouth at bedtime. 08/03/20   Delynn Flavin M, DO  DORZOLAMIDE HCL-TIMOLOL MAL OP Apply 1 drop to eye 2 (two) times daily.    [provider]  dorzolamide-timolol (COSOPT) 22.3-6.8 MG/ML ophthalmic solution SMARTSIG:1 Drop(s) In Eye(s)  Twice Daily PRN 10/21/20   [provider]  famotidine (PEPCID) 40 MG tablet 1 tablet by mouth in the evening if needed. 06/27/22   Kozlow, Alvira Philips, MD  FLOVENT HFA 220 MCG/ACT inhaler Inhale 2 puffs into the lungs 2 (two) times daily. Patient not taking: Reported on 06/27/2022 12/02/21   [provider]  latanoprost (XALATAN) 0.005 % ophthalmic solution Place 1 drop into the left eye at bedtime.    [provider]  levocetirizine (XYZAL) 5 MG tablet 1 tablet by mouth 1 (ONE) tie per day as needed. 06/27/22   Kozlow, Alvira Philips, MD  memantine (NAMENDA) 10 MG tablet Take 10 mg by mouth 2 (two) times daily. 01/28/22   [provider]  mirtazapine (REMERON) 7.5 MG tablet TAKE 1 TABLET AT BEDTIME FOR MOOD/SLEEP/APPETITE Patient not taking: Reported on 01/31/2022 03/21/21   Raliegh Ip, DO  Misc Natural Products (OSTEO BI-FLEX TRIPLE STRENGTH) TABS Take 4,000 each by mouth daily. Patient takes 2000 mg(2 tablets at one time daily)    [provider]  montelukast (SINGULAIR) 10 MG tablet Take 1 tablet (10 mg total) by mouth at bedtime. 06/27/22   Kozlow, Alvira Philips, MD  Multiple Vitamins-Minerals ([employer] SPECTRAVITE PO) Take 1 capsule by mouth daily.    [provider]  omeprazole (PRILOSEC) 40 MG capsule Take 1 capsule (40 mg total) by mouth 2 (two) times daily. 06/27/22  Kozlow, Alvira Philips, MD  potassium chloride SA (KLOR-CON M) 20 MEQ tablet Take 1 tablet (20 mEq total) by mouth daily. Ok to dissolve in 4 ounces of water and drink. DO NOT CRUSH. 08/23/21   Delynn Flavin M, DO  promethazine-dextromethorphan (PROMETHAZINE-DM) 6.25-15 MG/5ML syrup Take 2.5 mLs by mouth 4 (four) times daily as needed for cough. 05/25/21   Raliegh Ip, DO  triamcinolone (NASACORT) 55 MCG/ACT AERO nasal inhaler Place 1 spray into the nose 2 (two) times daily. Patient not taking: Reported on 01/31/2022 10/25/21   Jessica Priest, MD      Allergies    Patient has no known allergies.     Review of Systems   Review of Systems  Musculoskeletal:  Positive for joint swelling and myalgias.  All other systems reviewed and are negative.   Physical Exam Updated Vital Signs BP (!) 141/57   Pulse (!) 117   Temp 98.6 F (37 C) (Oral)   Resp 18   SpO2 98%  Physical Exam Vitals and nursing note reviewed.  Constitutional:      Appearance: She is well-developed.  HENT:     Head: Normocephalic.     Mouth/Throat:     Mouth: Mucous membranes are moist.  Eyes:     Pupils: Pupils are equal, round, and reactive to light.  Cardiovascular:     Rate and Rhythm: Normal rate.  Pulmonary:     Effort: Pulmonary effort is normal.  Abdominal:     General: There is no distension.  Musculoskeletal:        General: Swelling present.     Cervical back: Normal range of motion.     Comments: Swollen right ankle right ankle is tender to palpation tender left knee, tender left ankle, full range of motion neurovascular neurosensory are intact bilateral hips are nontender pelvic rock is negative,  Neurological:     Mental Status: She is alert and oriented to person, place, and time.     ED Results / Procedures / Treatments   Labs (all labs ordered are listed, but only abnormal results are displayed) Labs Reviewed - No data to display  EKG None  Radiology DG Ankle Complete Left  Result Date: 10/02/2022 CLINICAL DATA:  Fall with caregiver. Swelling in both legs. Right worse than left. EXAM: LEFT ANKLE COMPLETE - 3+ VIEW COMPARISON:  None are available FINDINGS: Screw fixation of the distal tibia and fibula. No radiographic evidence of loosening. Remote posttraumatic change about the tibia and fibula. No evidence of acute fracture. No dislocation. Soft tissue swelling about the ankle. Vascular calcifications. Demineralization. IMPRESSION: No evidence of acute fracture or dislocation. Electronically Signed   By: Minerva Fester M.D.   On: 10/02/2022 20:01   DG Knee Complete 4 Views  Right  Result Date: 10/02/2022 CLINICAL DATA:  Fall with caregiver. Swelling in both legs. Right worse than left. EXAM: RIGHT TIBIA AND FIBULA - 2 VIEW; RIGHT KNEE - COMPLETE 4+ VIEW COMPARISON:  None are available. FINDINGS: Demineralization. No acute fracture or dislocation. Lateral and medial compartment chondrocalcinosis in the knee. No significant effusion. Mild swelling about the knee and ankle. Vascular calcifications IMPRESSION: 1. No acute fracture or dislocation. Electronically Signed   By: Minerva Fester M.D.   On: 10/02/2022 20:00   DG Tibia/Fibula Right  Result Date: 10/02/2022 CLINICAL DATA:  Fall with caregiver. Swelling in both legs. Right worse than left. EXAM: RIGHT TIBIA AND FIBULA - 2 VIEW; RIGHT KNEE - COMPLETE 4+ VIEW COMPARISON:  None are available. FINDINGS: Demineralization. No acute fracture or dislocation. Lateral and medial compartment chondrocalcinosis in the knee. No significant effusion. Mild swelling about the knee and ankle. Vascular calcifications IMPRESSION: 1. No acute fracture or dislocation. Electronically Signed   By: Minerva Fester M.D.   On: 10/02/2022 20:00    Procedures Procedures    Medications Ordered in ED Medications - No data to display  ED Course/ Medical Decision Making/ A&P                             Medical Decision Making Patient slid out of her wheelchair yesterday patient's right ankle turned under her and she struck her left knee.  Patient had an aide who assisted her to the floor  Amount and/or Complexity of Data Reviewed Independent Historian: caregiver    Details: Patient is here with family who is supportive and helps provide history Radiology: ordered and independent interpretation performed. Decision-making details documented in ED Course.    Details: X-ray right tib-fib no fracture x-ray left ankle and left knee no fracture  Risk Risk Details: No impact of head no significant neck or spine tenderness no shortening or  rotation of extremities I doubt fracture of hip or spine.  I have advised Tylenol or ibuprofen for discomfort return to the emergency department if symptoms worsen or change           Final Clinical Impression(s) / ED Diagnoses Final diagnoses:  Mild ankle sprain, right, initial encounter  Contusion of left knee, initial encounter    Rx / DC Orders ED Discharge Orders     None     An After Visit Summary was printed and given to the patient.     Osie Cheeks 10/02/22 2103    Ernie Avena, MD 10/03/22 [id number]

## 2022-10-02 NOTE — Discharge Instructions (Signed)
Tylenol for discomfort

## 2022-10-03 NOTE — ED Notes (Signed)
DC papers reviewed. No questions or concerns. No signs of distress. Pt assisted to wheelchair and out to lobby. Assisted into car. Appropriate measures for safety taken.

## 2023-03-13 IMAGING — DX DG CHEST 1V
1 series · 1 of 1 positions shown · non-contrast
Comparison: October 29, 2020.

CLINICAL DATA: Cough.

EXAM:
CHEST  1 VIEW

[chest ap]
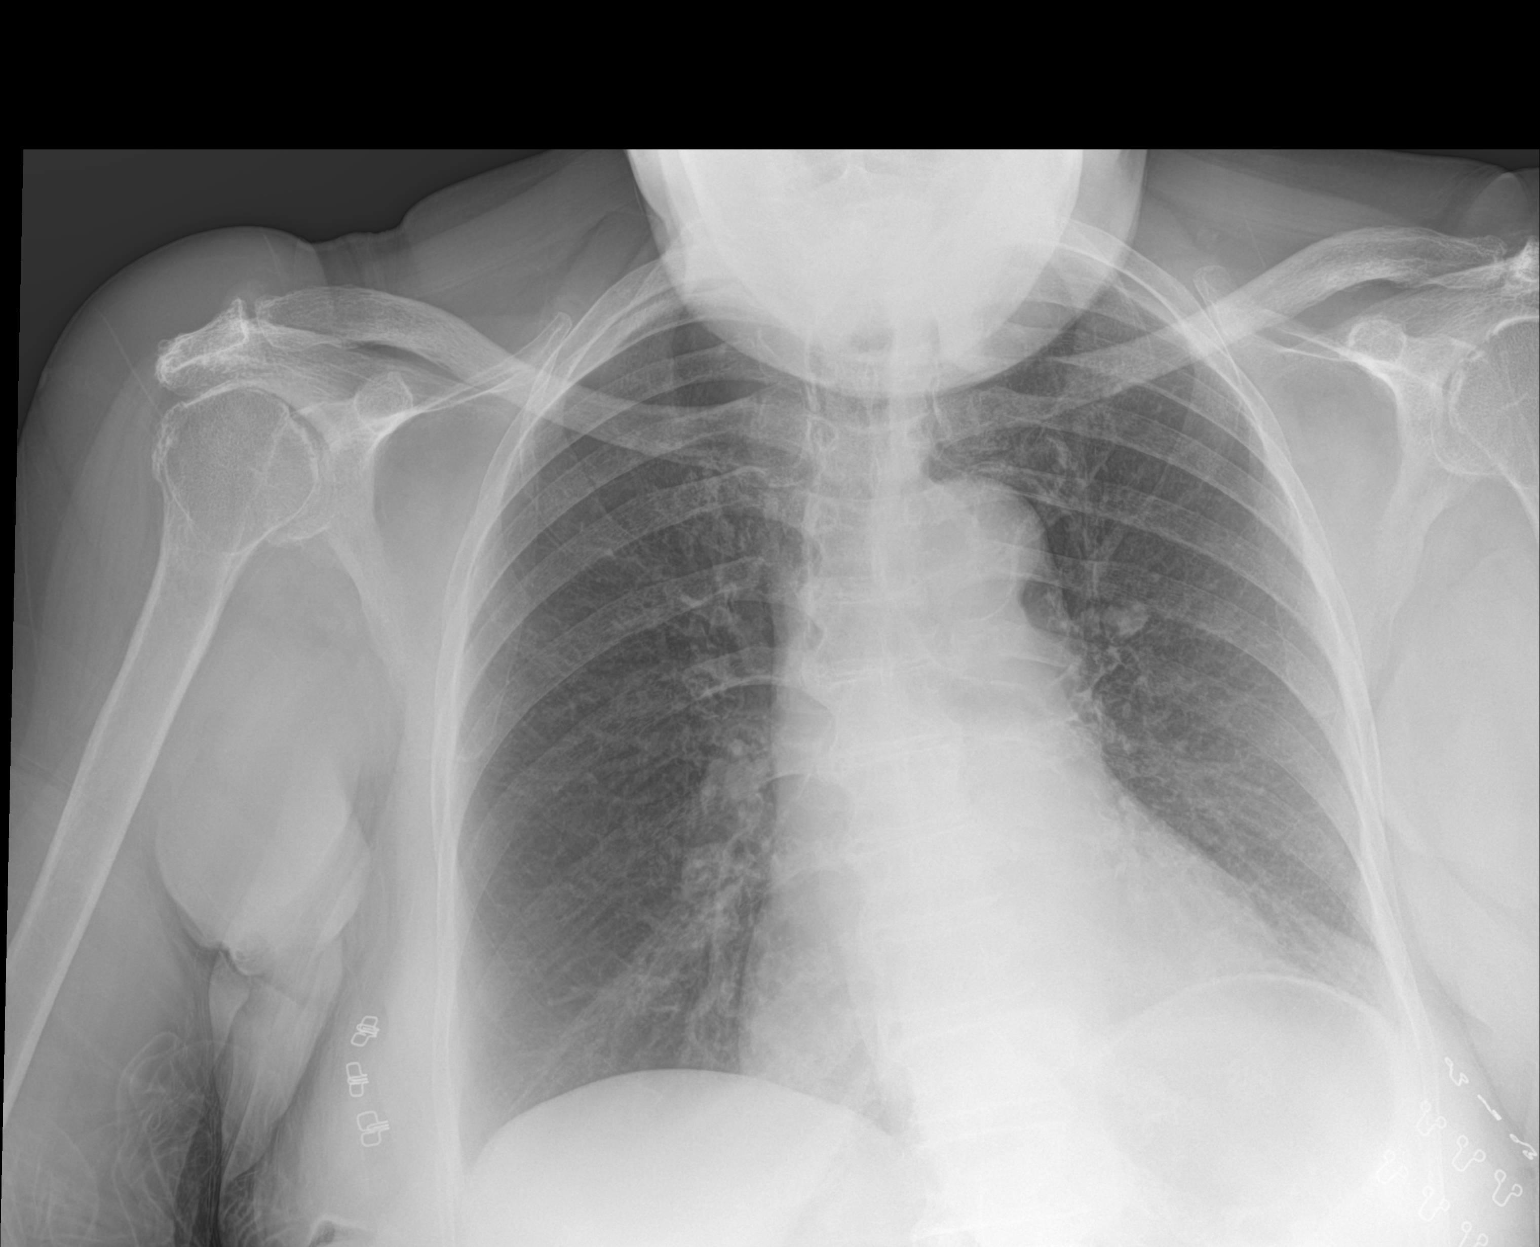

[1 of 1 positions shown; findings below may reference images not displayed]

FINDINGS: The heart size and mediastinal contours are within normal limits.
Both lungs are clear. The visualized skeletal structures are
unremarkable.
IMPRESSION: No active disease.

## 2023-04-12 ENCOUNTER — Ambulatory Visit (INDEPENDENT_AMBULATORY_CARE_PROVIDER_SITE_OTHER): Payer: Medicare PPO

## 2023-06-28 ENCOUNTER — Other Ambulatory Visit: Payer: Self-pay | Admitting: Allergy and Immunology

## 2023-07-24 ENCOUNTER — Other Ambulatory Visit: Payer: Self-pay | Admitting: Allergy and Immunology

## 2023-08-27 ENCOUNTER — Other Ambulatory Visit: Payer: Self-pay | Admitting: Allergy and Immunology

## 2023-11-27 ENCOUNTER — Ambulatory Visit (INDEPENDENT_AMBULATORY_CARE_PROVIDER_SITE_OTHER): Payer: Self-pay | Admitting: Audiology

## 2023-11-27 DIAGNOSIS — H903 Sensorineural hearing loss, bilateral: Secondary | ICD-10-CM

## 2023-11-27 NOTE — Progress Notes (Signed)
  [address] [place], [place] [address] [phone]  Hearing Aid Check     SANITA ESTRADA 's daughter comes for a scheduled appointment for a hearing aid check.      Right Left  Hearing aid manufacturer [employer] OWN 3 miniRITE R SN: [id number] [employer] OWN 3 miniRITE R SN: [id number]  Hearing aid style ITE - Full Shell ITE - Full Shell  Hearing aid battery [phone]  Receiver 75 75  Warranty expiration date 03-02-2026 03-02-2026  Loss and Damage unknown unknown  Initial fitting date 02-01-2023 02-01-2023  [facility]: [facility]    Chief complaint: Patient's daughter reports that the hearing aids do not work because she doe snot hear them feedback like the other ones.   Actions taken: Inspection of the device and listening check showed that wax filters were clean. Cleaned the microphone ports. Turned off the Armed forces logistics/support/administrative officer system so that she as a caregiver can hear the feedback more.   Services fee: $0 was paid at checkout.  Patient's daughter was oriented about dropping off the hearing aids if the hearing aids started to feedback so that I turned on the feedback manager on again (explained this is a possibility given that the feedback manager was turned off).  Recommend: Return for a hearing aid check , as needed. Return for a hearing evaluation and to see an ENT, if concerns with hearing changes arise.    Jdyn Parkerson [name], AUD

## 2024-01-08 ENCOUNTER — Ambulatory Visit (INDEPENDENT_AMBULATORY_CARE_PROVIDER_SITE_OTHER): Payer: Self-pay | Admitting: Audiology

## 2024-01-08 DIAGNOSIS — H903 Sensorineural hearing loss, bilateral: Secondary | ICD-10-CM

## 2024-01-08 NOTE — Progress Notes (Signed)
  [address] [place], [place] [address] [phone]  Hearing Aid Check     JULEEN SORRELS walked to to drop off her aids to be checked.       Right Left  Hearing aid manufacturer [employer] OWN 3 miniRITE R SN: [id number] [employer] OWN 3 miniRITE R SN: [id number]  Hearing aid style ITE - Full Shell ITE - Full Shell  Hearing aid battery [phone]  Receiver 75 75  Warranty expiration date 03-02-2026 03-02-2026  Loss and Damage unknown unknown  Initial fitting date 02-01-2023 02-01-2023  [facility]: [facility]         Chief complaint: Patient reports needs hearing aids checked because they are not working after they got wet.  Actions taken: Sent both aids to [employer] to have them checked for moisture issues. Called daughter to let her know. She agreed.   Services fee: $0 was paid at checkout. $0 will be due at pick up as patient is under warranty.      Recommend: Return to pick up the aids at their return. Return for a hearing evaluation and to see an ENT, if concerns with hearing changes arise.    Jah Alarid [name], AUD

## 2024-01-11 ENCOUNTER — Encounter: Payer: Self-pay | Admitting: Advanced Practice Midwife

## 2024-01-21 ENCOUNTER — Telehealth (INDEPENDENT_AMBULATORY_CARE_PROVIDER_SITE_OTHER): Payer: Self-pay | Admitting: Audiology

## 2024-01-21 NOTE — Telephone Encounter (Signed)
 Patient's daughter, Dr. Nena Rowels, called to check the status of her mother's, the patient's hearing aids.  They were sent out for repair approximately 2 weeks ago.  Please call her at [phone].  She would like to know when they will be ready for pickup and if there is an amount due for the repair.

## 2024-01-22 ENCOUNTER — Ambulatory Visit (INDEPENDENT_AMBULATORY_CARE_PROVIDER_SITE_OTHER): Payer: Self-pay | Admitting: Audiology

## 2024-01-22 DIAGNOSIS — H903 Sensorineural hearing loss, bilateral: Secondary | ICD-10-CM

## 2024-01-24 NOTE — Progress Notes (Signed)
  [address] [place], [place] [address] [phone]  Hearing Aid Check     Christina Delgado's daughter came to pick up her hearing aids after she was called. She was helped by the front desk.        Right Left  Hearing aid manufacturer [employer] OWN 3 miniRITE R SN: [id number] [employer] OWN 3 miniRITE R SN: [id number]  Hearing aid style ITE - Full Shell ITE - Full Shell  Hearing aid battery [phone]  Receiver 75 75  Warranty expiration date 03-02-2026 03-02-2026  Loss and Damage unknown unknown  Initial fitting date 02-01-2023 02-01-2023  Device [facility]: [facility]      Hearing aids returned after repair by [employer]. Both sound well as per my listening check. Previous settings were loaded to the aids.  Services fee: $0 was paid at checkout.  Recommend: Return for a hearing aid check as needed. Return for a hearing evaluation and to see an ENT, if concerns with hearing changes arise.    Iain Sawchuk [name], AUD

## 2024-01-30 ENCOUNTER — Ambulatory Visit (INDEPENDENT_AMBULATORY_CARE_PROVIDER_SITE_OTHER): Payer: Self-pay | Admitting: Audiology

## 2024-01-30 DIAGNOSIS — H903 Sensorineural hearing loss, bilateral: Secondary | ICD-10-CM

## 2024-01-31 ENCOUNTER — Telehealth (INDEPENDENT_AMBULATORY_CARE_PROVIDER_SITE_OTHER): Payer: Self-pay | Admitting: Audiology

## 2024-01-31 NOTE — Telephone Encounter (Signed)
 Patient's daughter called and agreed to have the HA shipped out for repair.  She understands that the estimated cost for repair is approx $350.  She has requested that if the price is greater than the estimate given that we call to inform and let them decide if they want to proceed.

## 2024-01-31 NOTE — Progress Notes (Unsigned)
  [address], [address] [place], [place] [address] [phone]  Hearing Aid Check     Christina Delgado's daughter came yesterday to leave the right Phonak hearing aid because of concerns that the wire was not working.    Patient has 2 sets of hearing aids. This Phonak RIC aid is older and out of warranty compared to the custom ITE [employer] set.   Actions taken: Inspection of the device and listening check showed that replacing the wire did not fix the issue, there is an internal crackling/popping sound.    Services fee: $0 was paid at checkout.  Patient was oriented about en estimate cost of $350 for the repair as it has to be sent out to [facility].  Recommend: Return for a hearing aid check , as needed. Return for a hearing evaluation and to see an ENT, if concerns with hearing changes arise.    Lonette Stevison [name], AUD

## 2024-02-15 ENCOUNTER — Ambulatory Visit (INDEPENDENT_AMBULATORY_CARE_PROVIDER_SITE_OTHER): Payer: Self-pay | Admitting: Audiology

## 2024-02-15 DIAGNOSIS — H903 Sensorineural hearing loss, bilateral: Secondary | ICD-10-CM

## 2024-02-19 NOTE — Progress Notes (Signed)
  [address] [place], [place] [address] [phone]  Hearing Aid Check     Christina Delgado's family member walked in to pick up the repaired hearing aid.   Patient has two sets of hearing aids. An older set is Phonak  and a newer set that is [employer].   Actions taken: The right Phonak hearing aid was picked up after it was programmed based on last session.   Services fee: [phone] out of warranty repair + $30  service fee  was paid at checkout. Now the right Phonak hearing aid has service warranty up until 02-06-2025.    Recommend: Return for a hearing aid check , as needed. Return for a hearing evaluation and to see an ENT, if concerns with hearing changes arise.    Christina Delgado [name], AUD

## 2024-05-01 ENCOUNTER — Ambulatory Visit (INDEPENDENT_AMBULATORY_CARE_PROVIDER_SITE_OTHER): Payer: Self-pay | Admitting: Audiology

## 2024-05-01 DIAGNOSIS — H903 Sensorineural hearing loss, bilateral: Secondary | ICD-10-CM

## 2024-05-01 NOTE — Progress Notes (Signed)
  [address], [address] [place], [place] [address] [phone]  Hearing Aid Check     Christina Delgado  family member walked in to drop off her O[employer]on ITE hearing aids.         Right Left  Hearing aid manufacturer [employer] OWN 3 miniRITE R SN: [id number] [employer] OWN 3 miniRITE R SN: [id number]  Hearing aid style ITE - Full Shell ITE - Full Shell  Hearing aid battery [phone]  Receiver 75 75  Warranty expiration date 03-02-2026 03-02-2026  Loss and Damage unknown unknown  Initial fitting date 02-01-2023 02-01-2023  [facility]: [facility]      Chief complaint: Patient reports the right side is not working. .  Actions taken:  Asked Ms. Tastet  to authorize sending the right hearing aid to the manufacturer for repair? I've noticed that it is not amplifying properly, and in-office troubleshooting did not resolve the issue. It will take around 2 weeks for it to return from the manufacturer.  Services fee: $0 was paid at checkout.   Recommend: Return for a hearing aid check ,as needed Return for a hearing evaluation and to see an ENT, if concerns with hearing changes arise.    Christina Delgado Christina Delgado, AUD

## 2024-05-02 ENCOUNTER — Telehealth (INDEPENDENT_AMBULATORY_CARE_PROVIDER_SITE_OTHER): Payer: Self-pay | Admitting: Audiology

## 2024-05-02 NOTE — Telephone Encounter (Signed)
 Called patient per Dr. Tex request and spoke with her daughter, Chrisma Hurlock.  She is listed on the patient's DPR. Dr. Tiney wanted to get authorization to send the Right hearing aid to the manufacturer for repair.  It is not amplifying properly and she was not able to fix it in office.  Sandra Spires gave the ok to send out the hearing aid for repair.

## 2024-05-19 ENCOUNTER — Telehealth (INDEPENDENT_AMBULATORY_CARE_PROVIDER_SITE_OTHER): Payer: Self-pay | Admitting: Audiology

## 2024-05-19 NOTE — Telephone Encounter (Signed)
 Patient's daughter, Niko Jakel called on 05/16/2024 and LVM checking the status of the patient's hearing aid repair.  She can be reached at [phone].

## 2024-05-20 NOTE — Telephone Encounter (Signed)
 Called patient per Dr. Tex request.  Spoke with patient's daughter, Quinci Gavidia to let her know that the patient's hearing aids are ready for pickup.  $0 due at pickup.  Hearing aids are under warranty.  Patient's daughter stated she would be in sometime today to pick them up.

## 2024-05-21 ENCOUNTER — Ambulatory Visit (INDEPENDENT_AMBULATORY_CARE_PROVIDER_SITE_OTHER): Payer: Self-pay | Admitting: Audiology

## 2024-05-21 DIAGNOSIS — H903 Sensorineural hearing loss, bilateral: Secondary | ICD-10-CM

## 2024-05-21 NOTE — Progress Notes (Signed)
  [address], [address] [place], [place] [address] [phone]  Audiological Evaluation    Name: Christina Delgado     DOB:   1930/09/18      MRN:   [id number]                                                                                     Service Date: 05/21/2024        Patient's daughter came to pick up her hearing aids. See previous note for details.     Right Left  Hearing aid manufacturer [employer] OWN 3 miniRITE R SN: [id number] [employer] OWN 3 miniRITE R SN: [id number]  Hearing aid style ITE - Full Shell ITE - Full Shell  Hearing aid battery [phone]  Receiver 75 75  Warranty expiration date 03-02-2026 03-02-2026  Loss and Damage unknown unknown  Initial fitting date 02-01-2023 02-01-2023  [facility]       The front desk helped give the aids to the daughter.  Services fee: $0 was paid at checkout.    Recommend: Return for a hearing aid check , as needed. Return for a hearing evaluation and to see an ENT, if concerns with hearing changes arise.    Christina Delgado, AUD

## 2024-06-09 ENCOUNTER — Other Ambulatory Visit: Payer: Self-pay

## 2024-06-09 ENCOUNTER — Encounter (HOSPITAL_BASED_OUTPATIENT_CLINIC_OR_DEPARTMENT_OTHER): Payer: Self-pay | Admitting: Emergency Medicine

## 2024-06-09 ENCOUNTER — Emergency Department (HOSPITAL_BASED_OUTPATIENT_CLINIC_OR_DEPARTMENT_OTHER)

## 2024-06-09 ENCOUNTER — Emergency Department (HOSPITAL_BASED_OUTPATIENT_CLINIC_OR_DEPARTMENT_OTHER)
Admission: EM | Admit: 2024-06-09 | Discharge: 2024-06-09 | Disposition: A | Discharge status: Home / Self Care | Source: Home / Self Care | Attending: Emergency Medicine | Admitting: Emergency Medicine

## 2024-06-09 DIAGNOSIS — S93401A Sprain of unspecified ligament of right ankle, initial encounter: Secondary | ICD-10-CM

## 2024-06-09 DIAGNOSIS — M25572 Pain in left ankle and joints of left foot: Secondary | ICD-10-CM | POA: Diagnosis not present

## 2024-06-09 DIAGNOSIS — M25571 Pain in right ankle and joints of right foot: Secondary | ICD-10-CM | POA: Diagnosis present

## 2024-06-09 DIAGNOSIS — X501XXA Overexertion from prolonged static or awkward postures, initial encounter: Secondary | ICD-10-CM | POA: Diagnosis not present

## 2024-06-09 DIAGNOSIS — E119 Type 2 diabetes mellitus without complications: Secondary | ICD-10-CM | POA: Diagnosis not present

## 2024-06-09 DIAGNOSIS — I10 Essential (primary) hypertension: Secondary | ICD-10-CM | POA: Diagnosis not present

## 2024-06-09 NOTE — ED Provider Notes (Signed)
 [place] EMERGENCY DEPARTMENT AT [facility] Provider Note   CSN: [id number] Arrival date & time: 06/09/24  1644     Patient presents with: [place]   Christina Delgado is a 89 y.o. female with past medical history sniffer hypertension, degenerative joint disease, diabetes, hyperlipidemia who presents with twisting both ankles after sliding out of her lift chair on Saturday.  No pain at rest, but some pain with ambulation.    Fall       Prior to Admission medications  Medication Sig Start Date End Date Taking? Authorizing Provider  albuterol  (VENTOLIN  HFA) 108 (90 Base) MCG/ACT inhaler Inhale into the lungs. 12/01/21   [provider]  amLODipine  (NORVASC ) 5 MG tablet Take 1 tablet (5 mg total) by mouth daily. 08/10/21   Dettinger, Fonda LABOR, MD  azelastine  (ASTELIN ) 0.1 % nasal spray Place 1 spray into both nostrils 2 (two) times daily. 05/25/21   Jolinda Norene HERO, DO  benzonatate  (TESSALON ) 100 MG capsule Take 100 mg by mouth 3 (three) times daily as needed for cough. 06/08/22   [provider]  brimonidine (ALPHAGAN) 0.15 % ophthalmic solution Place 1 drop into both eyes 2 (two) times daily.    [provider]  brimonidine (ALPHAGAN) 0.2 % ophthalmic solution 1 drop 2 (two) times daily. 10/25/21   [provider]  Calcium Carbonate-Vitamin D 600-400 MG-UNIT tablet Take 1 tablet by mouth daily.    [provider]  donepezil  (ARICEPT ) 10 MG tablet Take 1 tablet (10 mg total) by mouth at bedtime. 08/03/20   Jolinda Norene M, DO  DORZOLAMIDE HCL-TIMOLOL MAL OP Apply 1 drop to eye 2 (two) times daily.    [provider]  dorzolamide-timolol (COSOPT) 22.3-6.8 MG/ML ophthalmic solution SMARTSIG:1 Drop(s) In Eye(s) Twice Daily PRN 10/21/20   [provider]  famotidine  (PEPCID ) 40 MG tablet 1 tablet by mouth in the evening if needed. 06/27/22   Kozlow, Camellia PARAS, MD  FLOVENT HFA 220 MCG/ACT inhaler Inhale 2 puffs into the  lungs 2 (two) times daily. Patient not taking: Reported on 06/27/2022 12/02/21   [provider]  latanoprost (XALATAN) 0.005 % ophthalmic solution Place 1 drop into the left eye at bedtime.    [provider]  levocetirizine (XYZAL ) 5 MG tablet TAKE ONE TABLET DAILY AS NEEDED 06/28/23   Kozlow, Eric J, MD  memantine (NAMENDA) 10 MG tablet Take 10 mg by mouth 2 (two) times daily. 01/28/22   [provider]  mirtazapine  (REMERON ) 7.5 MG tablet TAKE 1 TABLET AT BEDTIME FOR MOOD/SLEEP/APPETITE Patient not taking: Reported on 01/31/2022 03/21/21   Jolinda Norene HERO, DO  Misc Natural Products (OSTEO BI-FLEX TRIPLE STRENGTH) TABS Take 4,000 each by mouth daily. Patient takes 2000 mg(2 tablets at one time daily)    [provider]  montelukast  (SINGULAIR ) 10 MG tablet TAKE ONE TABLET AT BEDTIME 07/24/23   Kozlow, Eric J, MD  Multiple Vitamins-Minerals ([employer] SPECTRAVITE PO) Take 1 capsule by mouth daily.    [provider]  omeprazole  (PRILOSEC) 40 MG capsule TAKE ONE CAPSULE TWICE DAILY 06/28/23   Kozlow, Eric J, MD  potassium chloride  SA (KLOR-CON  M) 20 MEQ tablet Take 1 tablet (20 mEq total) by mouth daily. Ok to dissolve in 4 ounces of water and drink. DO NOT CRUSH. 08/23/21   Jolinda Norene M, DO  promethazine -dextromethorphan (PROMETHAZINE -DM) 6.25-15 MG/5ML syrup Take 2.5 mLs by mouth 4 (four) times daily as needed for cough. 05/25/21   Jolinda Norene HERO, DO  triamcinolone  (NASACORT ) 55 MCG/ACT AERO nasal inhaler Place 1 spray into the nose 2 (two) times daily. Patient not taking: Reported on 01/31/2022 10/25/21   Kozlow, Eric J, MD    Allergies: Patient has no known allergies.    Review of Systems  All other systems reviewed and are negative.   Updated Vital Signs BP (!) 141/80 (BP Location: Right Arm)   Pulse 78   Temp 98.6 F (37 C) (Oral)   Resp 20   SpO2 99%   Physical Exam Vitals and nursing note reviewed.  Constitutional:      General: She is  not in acute distress.    Appearance: Normal appearance.  HENT:     Head: Normocephalic and atraumatic.  Eyes:     General:        Right eye: No discharge.        Left eye: No discharge.  Cardiovascular:     Rate and Rhythm: Normal rate and regular rhythm.     Pulses: Normal pulses.     Comments: DP, PT pulses are 2+ in bilateral lower extremities Pulmonary:     Effort: Pulmonary effort is normal. No respiratory distress.  Musculoskeletal:        General: No swelling or deformity.     Comments: No significant swelling, deformity of bilateral ankles.  She has some mild tenderness to palpation on the medial malleolus on the right, and the lateral malleolus on the left.  Normal range of motion to plantarflexion, dorsiflexion.  Skin:    General: Skin is warm and dry.  Neurological:     Mental Status: She is alert and oriented to person, place, and time.  Psychiatric:        Mood and Affect: Mood normal.        Behavior: Behavior normal.     (all labs ordered are listed, but only abnormal results are displayed) Labs Reviewed - No data to display  EKG: None  Radiology: No results found.   Procedures   Medications Ordered in the ED - No data to display                                  Medical Decision Making Amount and/or Complexity of Data Reviewed Radiology: ordered.   This patient is a 89 y.o. female who presents to the ED for concern of bilateral ankle pain.   Differential diagnoses prior to evaluation: Fracture, dislocation, sprain, strain, vs other  Past Medical History / Social History / Additional history: Chart reviewed. Pertinent results include: Degenerative disease, hyperlipidemia, diabetes, hypertension, osteoporosis  Physical Exam: Physical exam performed. The pertinent findings include: No significant swelling, deformity of bilateral ankles.  She has some mild tenderness to palpation on the medial malleolus on the right, and the lateral malleolus on  the left.  Normal range of motion to plantarflexion, dorsiflexion.   DP, PT pulses are 2+ in the affected bilateral lower extremities.  I independently interpreted imaging including Plain from radiographs of bilateral ankles which shows appears to show no evidence of acute fracture, dislocation, pending radiologist interpretation at time of handoff.   6:12 PM Care of Christina Delgado transferred to [facility] and Dr. Yolande at the end of my shift as the patient will require reassessment once labs/imaging have resulted. Patient presentation, ED course, and plan of care discussed with review of all pertinent labs and imaging. Please see his/her note for  further details regarding further ED course and disposition. Plan at time of handoff is pending plain film radiograph of bilateral ankles, suspect not broken,. This may be altered or completely changed at the discretion of the oncoming team pending results of further workup.   Final diagnoses:  None    ED Discharge Orders     None          Christina Delgado 06/09/24 1812    Yolande Lamar BROCKS, MD 06/10/24 [phone]

## 2024-06-09 NOTE — ED Triage Notes (Signed)
 Slid out of lift chair on Saturday Twisted both ankles Limping and pain with both ankles No pain at rest

## 2024-06-09 NOTE — Discharge Instructions (Signed)
 Ace wrap, ice, Tylenol 1000 mg every 6 hours for pain.  If possible try to mobilize early and engage in physical therapy as muscle loss during this period of time could be detrimental and cause more frequent falls later down the road.
